# Patient Record
Sex: Female | Born: 1990 | Race: Black or African American | Hispanic: No | Marital: Married | Smoking: Never smoker
Health system: Southern US, Community
[De-identification: ages and names within clinical notes are randomized; demographics above are authoritative.]

## PROBLEM LIST (undated history)

## (undated) ENCOUNTER — Inpatient Hospital Stay (HOSPITAL_COMMUNITY): Payer: Self-pay

## (undated) DIAGNOSIS — E119 Type 2 diabetes mellitus without complications: Secondary | ICD-10-CM

## (undated) DIAGNOSIS — D649 Anemia, unspecified: Secondary | ICD-10-CM

## (undated) DIAGNOSIS — F419 Anxiety disorder, unspecified: Secondary | ICD-10-CM

## (undated) DIAGNOSIS — D219 Benign neoplasm of connective and other soft tissue, unspecified: Secondary | ICD-10-CM

## (undated) DIAGNOSIS — M549 Dorsalgia, unspecified: Secondary | ICD-10-CM

## (undated) DIAGNOSIS — G8929 Other chronic pain: Secondary | ICD-10-CM

## (undated) HISTORY — DX: Anemia, unspecified: D64.9

## (undated) HISTORY — PX: NO PAST SURGERIES: SHX2092

---

## 2010-10-17 NOTE — L&D Delivery Note (Signed)
Delivery Note At 1:27 AM a viable female was delivered via Vaginal, Spontaneous Delivery (Presentation: Right Occiput Anterior).  APGAR: 8, 8; weight 7 lb 8.3 oz (3410 g).   Placenta status: Intact, Spontaneous.  Cord: 3 vessels with the following complications: None.  Anesthesia: Epidural Local  Episiotomy: none Lacerations: 4th degree Suture Repair: 2.0, 3.0, and 0 vicryl Est. Blood Loss (mL): 300cc  The delivery was performed by Almond Lint, CNM.  I was called to evaluate patient secondary to an extensive laceration.  A 4th degree laceration was visualized and repaired in layers by repairing the rectal mucosa with 3-0 vicryl via subcuticular stitch.  The sphincter was repaired with several stitches of 0 vicryl.  The underlying tissue and vaginal mucosa was repaired with 2-0 vicryl and remainder repaired with 3-0 vicryl via subcuticular stitch.  The epidural was suboptimal and 10mg  of Nubain was given along with local of approximately 20cc of 1% lidocaine.  Mom to postpartum.  Baby to nursery-stable.  Deborah Dondero Y 05/16/2011, 3:22 AM

## 2010-12-01 LAB — ABO/RH: RH Type: POSITIVE

## 2010-12-01 LAB — RPR: RPR: NONREACTIVE

## 2010-12-01 LAB — TYPE AND SCREEN: Antibody Screen: NEGATIVE

## 2010-12-01 LAB — HIV ANTIBODY (ROUTINE TESTING W REFLEX): HIV: NONREACTIVE

## 2011-04-21 LAB — STREP B DNA PROBE: GBS: NEGATIVE

## 2011-05-15 ENCOUNTER — Inpatient Hospital Stay (HOSPITAL_COMMUNITY): Payer: Medicaid Other | Admitting: Anesthesiology

## 2011-05-15 ENCOUNTER — Encounter (HOSPITAL_COMMUNITY): Payer: Self-pay | Admitting: Anesthesiology

## 2011-05-15 ENCOUNTER — Inpatient Hospital Stay (HOSPITAL_COMMUNITY)
Admission: AD | Admit: 2011-05-15 | Discharge: 2011-05-18 | DRG: 775 | Disposition: A | Discharge status: Home / Self Care | Payer: Medicaid Other | Source: Ambulatory Visit | Attending: Obstetrics and Gynecology | Admitting: Obstetrics and Gynecology

## 2011-05-15 ENCOUNTER — Encounter (HOSPITAL_COMMUNITY): Payer: Self-pay

## 2011-05-15 DIAGNOSIS — D649 Anemia, unspecified: Secondary | ICD-10-CM | POA: Diagnosis not present

## 2011-05-15 DIAGNOSIS — M549 Dorsalgia, unspecified: Secondary | ICD-10-CM | POA: Insufficient documentation

## 2011-05-15 DIAGNOSIS — Z603 Acculturation difficulty: Secondary | ICD-10-CM

## 2011-05-15 DIAGNOSIS — G8929 Other chronic pain: Secondary | ICD-10-CM | POA: Insufficient documentation

## 2011-05-15 DIAGNOSIS — O9903 Anemia complicating the puerperium: Secondary | ICD-10-CM | POA: Diagnosis not present

## 2011-05-15 HISTORY — DX: Other chronic pain: G89.29

## 2011-05-15 HISTORY — DX: Benign neoplasm of connective and other soft tissue, unspecified: D21.9

## 2011-05-15 HISTORY — DX: Dorsalgia, unspecified: M54.9

## 2011-05-15 LAB — CBC
HCT: 32.6 % — ABNORMAL LOW (ref 36.0–46.0)
MCHC: 33.7 g/dL (ref 30.0–36.0)
MCV: 83 fL (ref 78.0–100.0)
RDW: 13.3 % (ref 11.5–15.5)

## 2011-05-15 MED ORDER — LIDOCAINE HCL (PF) 1 % IJ SOLN: 30.0000 mL | INTRAMUSCULAR | Status: DC | PRN

## 2011-05-15 MED ORDER — FENTANYL CITRATE 0.05 MG/ML IJ SOLN
100.0000 ug | INTRAMUSCULAR | Status: DC | PRN
  Administered 2011-05-15: 100 ug via INTRAVENOUS
  Filled 2011-05-15: qty 2

## 2011-05-15 MED ORDER — ACETAMINOPHEN 325 MG PO TABS: 650.0000 mg | ORAL_TABLET | ORAL | Status: DC | PRN

## 2011-05-15 MED ORDER — OXYCODONE-ACETAMINOPHEN 5-325 MG PO TABS
2.0000 | ORAL_TABLET | ORAL | Status: DC | PRN
  Filled 2011-05-15: qty 1

## 2011-05-15 MED ORDER — OXYCODONE-ACETAMINOPHEN 5-325 MG PO TABS: 2.0000 | ORAL_TABLET | ORAL | Status: DC | PRN

## 2011-05-15 MED ORDER — DIPHENHYDRAMINE HCL 50 MG/ML IJ SOLN: 12.5000 mg | INTRAMUSCULAR | Status: DC | PRN

## 2011-05-15 MED ORDER — LACTATED RINGERS IV SOLN
INTRAVENOUS | Status: DC
  Administered 2011-05-15 – 2011-05-16 (×2): via INTRAVENOUS

## 2011-05-15 MED ORDER — CITRIC ACID-SODIUM CITRATE 334-500 MG/5ML PO SOLN: 30.0000 mL | ORAL | Status: DC | PRN

## 2011-05-15 MED ORDER — OXYTOCIN 10 UNIT/ML IJ SOLN: 10.0000 [IU] | Freq: Once | INTRAMUSCULAR | Status: DC

## 2011-05-15 MED ORDER — PHENYLEPHRINE 40 MCG/ML (10ML) SYRINGE FOR IV PUSH (FOR BLOOD PRESSURE SUPPORT)
80.0000 ug | PREFILLED_SYRINGE | INTRAVENOUS | Status: DC | PRN
  Filled 2011-05-15 (×2): qty 5

## 2011-05-15 MED ORDER — OXYTOCIN 20 UNITS IN LACTATED RINGERS INFUSION - SIMPLE
125.0000 mL/h | Freq: Once | INTRAVENOUS | Status: AC
  Administered 2011-05-16: 999 mL/h via INTRAVENOUS
  Filled 2011-05-15: qty 1000

## 2011-05-15 MED ORDER — LACTATED RINGERS IV SOLN: 500.0000 mL | INTRAVENOUS | Status: DC | PRN

## 2011-05-15 MED ORDER — ONDANSETRON HCL 4 MG/2ML IJ SOLN: 4.0000 mg | Freq: Four times a day (QID) | INTRAMUSCULAR | Status: DC | PRN

## 2011-05-15 MED ORDER — LACTATED RINGERS IV SOLN: INTRAVENOUS | Status: DC

## 2011-05-15 MED ORDER — LIDOCAINE HCL (PF) 1 % IJ SOLN
30.0000 mL | INTRAMUSCULAR | Status: AC | PRN
  Administered 2011-05-16: 30 mL via SUBCUTANEOUS
  Filled 2011-05-15 (×2): qty 30

## 2011-05-15 MED ORDER — FENTANYL 2.5 MCG/ML BUPIVACAINE 1/10 % EPIDURAL INFUSION (WH - ANES)
14.0000 mL/h | INTRAMUSCULAR | Status: DC
  Administered 2011-05-16 (×2): 14 mL/h via EPIDURAL
  Filled 2011-05-15: qty 60

## 2011-05-15 MED ORDER — PHENYLEPHRINE 40 MCG/ML (10ML) SYRINGE FOR IV PUSH (FOR BLOOD PRESSURE SUPPORT)
80.0000 ug | PREFILLED_SYRINGE | INTRAVENOUS | Status: DC | PRN
  Filled 2011-05-15: qty 5

## 2011-05-15 MED ORDER — IBUPROFEN 600 MG PO TABS: 600.0000 mg | ORAL_TABLET | Freq: Four times a day (QID) | ORAL | Status: DC | PRN

## 2011-05-15 MED ORDER — OXYTOCIN 20 UNITS IN LACTATED RINGERS INFUSION - SIMPLE: 125.0000 mL/h | Freq: Once | INTRAVENOUS | Status: DC

## 2011-05-15 MED ORDER — IBUPROFEN 600 MG PO TABS
600.0000 mg | ORAL_TABLET | Freq: Four times a day (QID) | ORAL | Status: DC | PRN
  Administered 2011-05-16: 600 mg via ORAL
  Filled 2011-05-15 (×10): qty 1

## 2011-05-15 MED ORDER — EPHEDRINE 5 MG/ML INJ
10.0000 mg | INTRAVENOUS | Status: DC | PRN
  Filled 2011-05-15 (×2): qty 4

## 2011-05-15 MED ORDER — EPHEDRINE 5 MG/ML INJ
10.0000 mg | INTRAVENOUS | Status: DC | PRN
  Filled 2011-05-15: qty 4

## 2011-05-15 NOTE — Progress Notes (Signed)
.  Subjective: Pt resting in bed, breathing w ctx, denies need for pain medication   Objective: BP 137/85  Pulse 90  Temp(Src) 98 F (36.7 C) (Oral)  Resp 20  LMP 08/09/2010   FHT:  FHR: 145 bpm, variability: moderate,  accelerations:  Present,  decelerations:  Absent UC:   irregular, every 3-5 minutes SVE:   Dilation: 7 Effacement (%): 100 Station: -1 Exam by:: Winifred Olive, RNC-OB    Assessment / Plan: Spontaneous labor, progressing normally  Fetal Wellbeing:  Category I Pain Control:  Labor support without medications  Continue expectant management  Dr Su Hilt aware of admission   Jasmin Ellis 05/15/2011, 2:17 PM

## 2011-05-15 NOTE — H&P (Signed)
Jasmin Ellis is a 20 y.o. female presenting for ctx, denies VB, LOF, repots +FM. Denies PIH sx's, no other c/o.  Maternal Medical History:  Reason for admission: Reason for admission: contractions.  Contractions: Onset was 3-5 hours ago.   Frequency: regular.   Duration is approximately 60 seconds.   Perceived severity is moderate.    Fetal activity: Perceived fetal activity is normal.   Last perceived fetal movement was within the past hour.      OB History    Grav Para Term Preterm Abortions TAB SAB Ect Mult Living   1 0 0 0 0 0 0 0 0 0      Past Medical History  Diagnosis Date  . Back pain, chronic     s/p fall 8 years ago  . Fibroids    Past Surgical History  Procedure Date  . No past surgeries    Family History: family history is not on file. Social History:  reports that she has never smoked. She has never used smokeless tobacco. She reports that she does not drink alcohol or use illicit drugs.  Review of Systems  Musculoskeletal: Positive for back pain.  Skin: Positive for rash.  All other systems reviewed and are negative.  rash has occurred off and on during pregnancy.   Dilation: 4 Effacement (%): 80 Station: -2 Exam by:: [employer], RNC-OB Blood pressure 130/79, pulse 105, temperature 98.1 F (36.7 C), temperature source Oral, resp. rate 18, last menstrual period 08/09/2010. Maternal Exam:  Uterine Assessment: Contraction strength is moderate.  Contraction frequency is regular.   Abdomen: Patient reports no abdominal tenderness. Estimated fetal weight is 7#10oz.   Fetal presentation: vertex  Introitus: Normal vulva. Normal vagina.    Fetal Exam Fetal Monitor Review: Mode: ultrasound.   Baseline rate: 150.  Variability: moderate (6-25 bpm).   Pattern: accelerations present and no decelerations.    Fetal State Assessment: Category I - tracings are normal.     Physical Exam  Vitals reviewed. Constitutional: She is oriented to person, place,  and time. She appears well-developed and well-nourished.  Cardiovascular: Normal rate and regular rhythm.   Respiratory: Effort normal and breath sounds normal.  GI: Soft.  Neurological: She is alert and oriented to person, place, and time. She has normal reflexes.  Skin: Skin is warm and dry.  Psychiatric: She has a normal mood and affect. Her behavior is normal.    Prenatal labs: ABO, Rh:  O Pos Antibody: Negative (02/15 0000) Rubella:  Immune RPR: Nonreactive (02/15 0000)  HBsAg: Negative (02/15 0000)  HIV: Non-reactive (02/15 0000)  GBS:   neg 1hr gtt - wnl QUAD - wnl    Assessment/Plan: Term IUP  GBS neg Early/active labor FHR reassuring  Admit to B.S. CNM service Routine CNM orders Analgesia PRN Consider AROM   Hammond Obeirne M 05/15/2011, 10:56 AM

## 2011-05-15 NOTE — Progress Notes (Signed)
.  Subjective: Pt crying and restless Requests epidural Feels no urge to push   Objective: BP 136/78  Pulse 101  Temp(Src) 98 F (36.7 C) (Oral)  Resp 20  Ht 5\' 4"  (1.626 m)  Wt 71.668 kg (158 lb)  BMI 27.12 kg/m2  LMP 08/09/2010   FHT:  FHR: 140 bpm, variability: moderate,  accelerations:  Present,  decelerations:  Absent UC:   regular, every 3 minutes SVE:   Dilation: 10 Effacement (%): 100 Station: +2 Exam by:: S Johany Hansman. CNM  Attempt to push, no fetal descent, baby is direct OP     Assessment / Plan: Spontaneous labor, progressing normally  Fetal Wellbeing:  Category I Pain Control:  Fentanyl  FHR reassuring  Will get epidural and labor down Dr Su Hilt updated   Malissa Hippo 05/15/2011, 11:27 PM

## 2011-05-15 NOTE — Progress Notes (Signed)
Report given to H. Clovis Riley, Charity fundraiser.

## 2011-05-15 NOTE — Progress Notes (Signed)
CNM Lillard updated re pt presence, status, FHT, UCs, VS.

## 2011-05-15 NOTE — Anesthesia Procedure Notes (Addendum)
Epidural Patient location during procedure: OB Start time: 05/15/2011 11:45 PM Reason for block: procedure for pain  Staffing Anesthesiologist: Kayln Garceau L. Performed by: anesthesiologist   Preanesthetic Checklist Completed: patient identified, site marked, surgical consent, pre-op evaluation, timeout performed, IV checked, risks and benefits discussed and monitors and equipment checked  Epidural Patient position: sitting Prep: site prepped and draped and DuraPrep Patient monitoring: continuous pulse ox, blood pressure and heart rate Approach: midline Injection technique: LOR air  Needle:  Needle type: Tuohy  Needle gauge: 17 G Needle length: 9 cm Needle insertion depth: 6 cm Catheter type: closed end flexible Catheter size: 19 Gauge Catheter at skin depth: 11 cm Test dose: negative  Assessment Events: blood not aspirated, injection not painful, no injection resistance, negative IV test and no paresthesia  Additional Notes Discussed risk of headache, infection, bleeding, nerve injury and failed or incomplete block using [place] interpreter by phone.  Patient voices understanding and wishes to proceed.  Placement difficult due to patient position and movement.  No apparent complications.

## 2011-05-15 NOTE — Anesthesia Preprocedure Evaluation (Addendum)
Anesthesia Evaluation  Name, MR# and DOB Patient awake  General Assessment Comment  Reviewed: Allergy & Precautions, H&P , Patient's Chart, lab work & pertinent test results and reviewed documented beta blocker date and time   Airway Mallampati: II      Dental   Pulmonaryneg pulmonary ROS    clear to auscultation    Cardiovascular regular Normal+ Systolic murmurs (SEM)   Neuro/PsychNegative Neurological ROS Negative Psych ROS  GI/Hepatic/Renal negative GI ROS, negative Liver ROS, and negative Renal ROS (+)       Endo/Other  Negative Endocrine ROS (+)   Abdominal   Musculoskeletal  Hematology negative hematology ROS (+)   Peds  Reproductive/Obstetrics (+) Pregnancy   Anesthesia Other Findings Chronic low back pain from a fall 8 years ago.  No h/o back surgery.            Anesthesia Physical Anesthesia Plan  ASA: II  Anesthesia Plan: Epidural   Post-op Pain Management:    Induction:   Airway Management Planned:   Additional Equipment:   Intra-op Plan:   Post-operative Plan:   Informed Consent: I have reviewed the patients History and Physical, chart, labs and discussed the procedure including the risks, benefits and alternatives for the proposed anesthesia with the patient or authorized representative who has indicated his/her understanding and acceptance.     Plan Discussed with:   Anesthesia Plan Comments:         Anesthesia Quick Evaluation

## 2011-05-15 NOTE — Progress Notes (Signed)
.  Subjective: Pt c/o increased pain, denies urge to push, declines medication    Objective: BP 125/76  Pulse 82  Temp(Src) 98.4 F (36.9 C) (Oral)  Resp 18  Ht 5\' 4"  (1.626 m)  Wt 71.668 kg (158 lb)  BMI 27.12 kg/m2  LMP 08/09/2010   FHT:  FHR: 140 bpm, variability: moderate,  accelerations:  Present,  decelerations:  Absent UC:   irregular, every 3-5 minutes SVE:   Dilation: 8 Effacement (%): 100 Station: 0 Exam by:: S Renalda Locklin CNM    Assessment / Plan: Spontaneous labor, progressing normally  Fetal Wellbeing:  Category I Pain Control:  Labor support without medications  Continue expectant management   Samaia Iwata M 05/15/2011, 6:03 PM

## 2011-05-15 NOTE — Progress Notes (Signed)
.  Subjective: Pt crying with ctx, requests IV pain med, no urge to push   Objective: BP 137/69  Pulse 96  Temp(Src) 98 F (36.7 C) (Oral)  Resp 20  Ht 5\' 4"  (1.626 m)  Wt 71.668 kg (158 lb)  BMI 27.12 kg/m2  LMP 08/09/2010   FHT:  FHR: 130 bpm, variability: moderate,  accelerations:  Present,  decelerations:  Absent UC:   regular, every 2-3 minutes SVE:   Dilation: 9 Effacement (%): 100 Station: 0 Exam by:: S. Blase Beckner CNM (meconium stained )    Assessment / Plan: Spontaneous labor, progressing normally  Fetal Wellbeing:  Category I Pain Control:  Fentanyl  Continue expectant management    Lan Entsminger M 05/15/2011, 9:50 PM

## 2011-05-15 NOTE — Progress Notes (Signed)
Asked Belenda Cruise, RNC & Charna Archer, RN to monitor pt while I assist c an epidural in another Rm.

## 2011-05-16 ENCOUNTER — Encounter (HOSPITAL_COMMUNITY): Payer: Self-pay | Admitting: *Deleted

## 2011-05-16 MED ORDER — SIMETHICONE 80 MG PO CHEW: 80.0000 mg | CHEWABLE_TABLET | ORAL | Status: DC | PRN

## 2011-05-16 MED ORDER — BENZOCAINE-MENTHOL 20-0.5 % EX AERO: 1.0000 "application " | INHALATION_SPRAY | CUTANEOUS | Status: DC | PRN

## 2011-05-16 MED ORDER — ZOLPIDEM TARTRATE 5 MG PO TABS: 5.0000 mg | ORAL_TABLET | Freq: Every evening | ORAL | Status: DC | PRN

## 2011-05-16 MED ORDER — SODIUM BICARBONATE 8.4 % IV SOLN
INTRAVENOUS | Status: DC | PRN
  Administered 2011-05-16: 5 mL via EPIDURAL

## 2011-05-16 MED ORDER — IBUPROFEN 600 MG PO TABS
600.0000 mg | ORAL_TABLET | Freq: Four times a day (QID) | ORAL | Status: DC
  Administered 2011-05-16 – 2011-05-18 (×7): 600 mg via ORAL

## 2011-05-16 MED ORDER — NALBUPHINE SYRINGE 5 MG/0.5 ML
INJECTION | INTRAMUSCULAR | Status: AC
  Administered 2011-05-16: 10 mg via INTRAVENOUS
  Filled 2011-05-16: qty 1

## 2011-05-16 MED ORDER — TETANUS-DIPHTH-ACELL PERTUSSIS 5-2.5-18.5 LF-MCG/0.5 IM SUSP
0.5000 mL | Freq: Once | INTRAMUSCULAR | Status: AC
  Administered 2011-05-17: 0.5 mL via INTRAMUSCULAR

## 2011-05-16 MED ORDER — BENZOCAINE-MENTHOL 20-0.5 % EX AERO
INHALATION_SPRAY | CUTANEOUS | Status: AC
  Filled 2011-05-16: qty 56

## 2011-05-16 MED ORDER — LANOLIN HYDROUS EX OINT: TOPICAL_OINTMENT | CUTANEOUS | Status: DC | PRN

## 2011-05-16 MED ORDER — WITCH HAZEL-GLYCERIN EX PADS: MEDICATED_PAD | CUTANEOUS | Status: DC | PRN

## 2011-05-16 MED ORDER — NALBUPHINE SYRINGE 5 MG/0.5 ML
10.0000 mg | INJECTION | Freq: Four times a day (QID) | INTRAMUSCULAR | Status: AC | PRN
  Administered 2011-05-16: 10 mg via INTRAVENOUS

## 2011-05-16 MED ORDER — DIPHENHYDRAMINE HCL 25 MG PO CAPS: 25.0000 mg | ORAL_CAPSULE | Freq: Four times a day (QID) | ORAL | Status: DC | PRN

## 2011-05-16 MED ORDER — SENNOSIDES-DOCUSATE SODIUM 8.6-50 MG PO TABS
1.0000 | ORAL_TABLET | Freq: Every day | ORAL | Status: DC
  Administered 2011-05-16: 1 via ORAL
  Administered 2011-05-17: 2 via ORAL

## 2011-05-16 MED ORDER — ONDANSETRON HCL 4 MG/2ML IJ SOLN: 4.0000 mg | INTRAMUSCULAR | Status: DC | PRN

## 2011-05-16 MED ORDER — PRENATAL PLUS 27-1 MG PO TABS
1.0000 | ORAL_TABLET | Freq: Every day | ORAL | Status: DC
  Administered 2011-05-17 – 2011-05-18 (×2): 1 via ORAL
  Filled 2011-05-16 (×2): qty 1

## 2011-05-16 MED ORDER — LIDOCAINE HCL 1.5 % IJ SOLN
INTRAMUSCULAR | Status: DC | PRN
  Administered 2011-05-16: 2 mL
  Administered 2011-05-16 (×2): 5 mL

## 2011-05-16 MED ORDER — ONDANSETRON HCL 4 MG PO TABS: 4.0000 mg | ORAL_TABLET | ORAL | Status: DC | PRN

## 2011-05-16 MED ORDER — OXYCODONE-ACETAMINOPHEN 5-325 MG PO TABS
1.0000 | ORAL_TABLET | ORAL | Status: DC | PRN
  Administered 2011-05-16 – 2011-05-17 (×2): 1 via ORAL
  Filled 2011-05-16: qty 1

## 2011-05-16 NOTE — Progress Notes (Signed)
UR chart review completed.  

## 2011-05-16 NOTE — Progress Notes (Signed)
Basic teaching.

## 2011-05-17 LAB — CBC
MCHC: 33.6 g/dL (ref 30.0–36.0)
Platelets: 202 10*3/uL (ref 150–400)
RDW: 13.9 % (ref 11.5–15.5)
WBC: 16.2 10*3/uL — ABNORMAL HIGH (ref 4.0–10.5)

## 2011-05-17 NOTE — Progress Notes (Signed)
PATIENT HAS BEEN GIVING BOTTLES.  BREASTS VERY FULL/WARM.  ASSISTED WITH FEEDING ON LEFT BREAST IN CROSS CRADLE HOLD.  BABY TAKES SEVERAL SUCKS AND FALLS OFF AND NEEDS RE-LATCHED.  EXPLAINED TO PARENTS THAT BABY NEEDS TO BEGIN NURSING FREQUENTLY TO PREVENT ENGORGEMENT.  ADVISED NO MORE BOTTLES.  ENCOURAGED TO CALL FOR ASSIST PRN.

## 2011-05-17 NOTE — Progress Notes (Signed)
Post Partum Day 1 Subjective: up ad lib, voiding, + flatus and no appetite & has not been eating her s.o. states.  Working on [employer].  NBF "Waliya" in [place] asleep.  No PIH s/s.  VB stable.  Objective: Blood pressure 108/67, pulse 99, temperature 97.5 F (36.4 C), temperature source Oral, resp. rate 18, height 5\' 4"  (1.626 m), weight 71.668 kg (158 lb), last menstrual period 08/09/2010, SpO2 99.00%, unknown if currently breastfeeding.  Physical Exam:  General: alert, cooperative and no distress Lochia: appropriate Uterine Fundus: firm; u/-2 Incision: n/a Perineum: healing DVT Evaluation: No evidence of DVT seen on physical exam. Negative Homan's sign. Calf/Ankle edema is present--mainly mild pedal edema.   Basename 05/15/11 1145  HGB 11.0*  HCT 32.6*    Assessment/Plan: Plan for discharge tomorrow, Breastfeeding and Lactation consult.  Language barrier. Elevated BP's yesterday.  No PIH s/s, but will draw tomorrow AM.  No CBC drawn today--ordered. Encouraged patient to eat well-balanced diet, with protein, & adequate H2O.  Sitz bath prn.  LOS: 2 days   Yesica Kemler H 05/17/2011, 9:47 AM

## 2011-05-17 NOTE — Anesthesia Postprocedure Evaluation (Signed)
  Anesthesia Post-op Note  Patient: Jasmin Ellis  Procedure(s) Performed: * No procedures listed *  Patient Location: 130  Anesthesia Type: Epidural  Level of Consciousness: awake, alert  and oriented  Airway and Oxygen Therapy: Patient Spontanous Breathing  Post-op Pain: mild  Post-op Assessment: Post-op Vital signs reviewed  Post-op Vital Signs: Reviewed and stable  Complications: No apparent anesthesia complications

## 2011-05-18 LAB — COMPREHENSIVE METABOLIC PANEL
ALT: 11 U/L (ref 0–35)
AST: 22 U/L (ref 0–37)
Alkaline Phosphatase: 99 U/L (ref 39–117)
CO2: 23 mEq/L (ref 19–32)
Calcium: 9.8 mg/dL (ref 8.4–10.5)
GFR calc non Af Amer: >60 mL/min (ref >60)
Potassium: 4.3 mEq/L (ref 3.5–5.1)
Sodium: 134 mEq/L — ABNORMAL LOW (ref 135–145)

## 2011-05-18 LAB — CBC
MCH: 28.2 pg (ref 26.0–34.0)
Platelets: 217 10*3/uL (ref 150–400)
RBC: 3.12 MIL/uL — ABNORMAL LOW (ref 3.87–5.11)
WBC: 16.4 10*3/uL — ABNORMAL HIGH (ref 4.0–10.5)

## 2011-05-18 MED ORDER — OXYCODONE-ACETAMINOPHEN 5-325 MG PO TABS: 1.0000 | ORAL_TABLET | ORAL | Status: AC | PRN

## 2011-05-18 MED ORDER — FERROUS GLUCONATE IRON 246 (28 FE) MG PO TABS: 246.0000 mg | ORAL_TABLET | Freq: Every day | ORAL | Status: DC

## 2011-05-18 MED ORDER — IBUPROFEN 600 MG PO TABS: 600.0000 mg | ORAL_TABLET | Freq: Four times a day (QID) | ORAL | Status: AC

## 2011-05-18 MED ORDER — PRENATAL PLUS 27-1 MG PO TABS: 1.0000 | ORAL_TABLET | Freq: Every day | ORAL | Status: DC

## 2011-05-18 NOTE — Progress Notes (Signed)
Assisted with deeper latch, pinching of nipple . Informed mom importance of deep latch to transfer mom milk. Father of baby interpreted.hand pump given with inst .

## 2011-05-18 NOTE — Discharge Summary (Signed)
Obstetric Discharge Summary Reason for Admission: onset of labor Prenatal Procedures: ultrasound Intrapartum Procedures: spontaneous vaginal delivery Postpartum Procedures: none Complications-Operative and Postpartum: 4th degree perineal laceration Hemoglobin  Date Value Range Status  05/18/2011 8.8* 12.0-15.0 (g/dL) Final     HCT  Date Value Range Status  05/18/2011 26.4* 36.0-46.0 (%) Final    Discharge Diagnoses: Term Pregnancy-delivered Anemia without hemodynamic instability 4th degree laceration--healing well  Discharge Information: Date: 05/18/2011 Activity: pelvic rest Diet: routine Medications: Ibuprophen, Percocet and Iron Condition: stable Instructions: refer to practice specific booklet Discharge to: home Follow-up Information    Follow up with Cathye Kreiter L. Make an appointment in 6 weeks.   Contact information:   [address], [address] [place] [address] [phone]          Newborn Data: Live born female  Birth Weight: 7 lb 8.3 oz (3410 g) APGAR: 8, 8  Home with mother.  Kamonte Mcmichen L 05/18/2011, 10:50 AM

## 2011-05-18 NOTE — Progress Notes (Signed)
Post Partum Day 2. Subjective: no complaints.  Ready for discharge.  Husband at bedside.  Undecided regarding contraception.  Breastfeeding going better.  Up ad lib without syncope or dizziness.  Objective: Blood pressure 116/72, pulse 98, temperature 98.3 F (36.8 C), temperature source Oral, resp. rate 18, height 5\' 4"  (1.626 m), weight 71.668 kg (158 lb), last menstrual period 08/09/2010, SpO2 100.00%, unknown if currently breastfeeding.  Physical Exam:  General: alert Lochia: appropriate Uterine Fundus: firm Incision: healing well DVT Evaluation: No evidence of DVT seen on physical exam.   Basename 05/18/11 0500 05/17/11 1040  HGB 8.8* 8.6*  HCT 26.4* 25.6*    Assessment/Plan: Discharge home Rx Ibuprophen 600 mg po q 6 hrs. Prn pain. Rx FeSO4 BID. Follow-up in 6 weeks at [facility] or prn.   LOS: 3 days   Marsha Gundlach L 05/18/2011, 10:47 AM

## 2012-12-10 ENCOUNTER — Emergency Department (HOSPITAL_COMMUNITY)
Admission: EM | Admit: 2012-12-10 | Discharge: 2012-12-10 | Disposition: A | Discharge status: Home / Self Care | Payer: Self-pay | Attending: Emergency Medicine | Admitting: Emergency Medicine

## 2012-12-10 ENCOUNTER — Encounter (HOSPITAL_COMMUNITY): Payer: Self-pay | Admitting: *Deleted

## 2012-12-10 DIAGNOSIS — Z3202 Encounter for pregnancy test, result negative: Secondary | ICD-10-CM | POA: Insufficient documentation

## 2012-12-10 DIAGNOSIS — R109 Unspecified abdominal pain: Secondary | ICD-10-CM | POA: Insufficient documentation

## 2012-12-10 DIAGNOSIS — G8929 Other chronic pain: Secondary | ICD-10-CM | POA: Insufficient documentation

## 2012-12-10 DIAGNOSIS — N760 Acute vaginitis: Secondary | ICD-10-CM | POA: Insufficient documentation

## 2012-12-10 DIAGNOSIS — N898 Other specified noninflammatory disorders of vagina: Secondary | ICD-10-CM | POA: Insufficient documentation

## 2012-12-10 DIAGNOSIS — D259 Leiomyoma of uterus, unspecified: Secondary | ICD-10-CM | POA: Insufficient documentation

## 2012-12-10 DIAGNOSIS — B9689 Other specified bacterial agents as the cause of diseases classified elsewhere: Secondary | ICD-10-CM

## 2012-12-10 LAB — URINALYSIS, ROUTINE W REFLEX MICROSCOPIC
Glucose, UA: NEGATIVE mg/dL
Ketones, ur: NEGATIVE mg/dL
Leukocytes, UA: NEGATIVE
pH: 7 (ref 5.0–8.0)

## 2012-12-10 LAB — URINE MICROSCOPIC-ADD ON

## 2012-12-10 LAB — POCT PREGNANCY, URINE: Preg Test, Ur: NEGATIVE

## 2012-12-10 MED ORDER — METRONIDAZOLE 500 MG PO TABS: 500.0000 mg | ORAL_TABLET | Freq: Two times a day (BID) | ORAL | Status: DC

## 2012-12-10 NOTE — ED Provider Notes (Signed)
History     CSN: [id number]  Arrival date & time 12/10/12  1531   First MD Initiated Contact with Patient 12/10/12 1838      Chief Complaint  Patient presents with  . Abdominal Pain  . Vaginal Discharge    (Consider location/radiation/quality/duration/timing/severity/associated sxs/prior treatment) HPI Comments: Patient is a 22 year old female with a past medical history of uterine fibroid who presents with lower abdominal pain for the past 5 months. The pain is located in her lower abdomen and does not radiate. The pain is described as cramping and moderate. The pain is intermittent and made worse when she is menstruating. The pain started gradually and progressively worsened since the onset. No alleviating factors. The patient has tried nothing for symptoms without relief. Associated symptoms include vaginal discharge. Patient denies fever, headache, NVD, chest pain, SOB, dysuria, constipation, abnormal vaginal bleeding. Patient currently has her menstrual cycle.     Patient is a 22 y.o. female presenting with abdominal pain and vaginal discharge.  Abdominal Pain Associated symptoms: vaginal discharge   Vaginal Discharge Associated symptoms include abdominal pain.    Past Medical History  Diagnosis Date  . Back pain, chronic     s/p fall 8 years ago  . Fibroids     History reviewed. No pertinent past surgical history.  History reviewed. No pertinent family history.  History  Substance Use Topics  . Smoking status: Never Smoker   . Smokeless tobacco: Never Used  . Alcohol Use: No    OB History   Grav Para Term Preterm Abortions TAB SAB Ect Mult Living   1 1 1  0 0 0 0 0 0 1      Review of Systems  Gastrointestinal: Positive for abdominal pain.  Genitourinary: Positive for vaginal discharge.  All other systems reviewed and are negative.    Allergies  Review of patient's allergies indicates no known allergies.  Home Medications   Current Outpatient Rx   Name  Route  Sig  Dispense  Refill  . acetaminophen (TYLENOL) 325 MG tablet   Oral   Take 650 mg by mouth every 6 (six) hours as needed for pain or fever.           BP 131/69  Pulse 79  Temp(Src) 98.2 F (36.8 C) (Oral)  Resp 18  SpO2 99%  Physical Exam  Nursing note and vitals reviewed. Constitutional: She is oriented to person, place, and time. She appears well-developed and well-nourished. No distress.  HENT:  Head: Normocephalic and atraumatic.  Eyes: Conjunctivae and EOM are normal. Pupils are equal, round, and reactive to light. No scleral icterus.  Neck: Normal range of motion.  Cardiovascular: Normal rate and regular rhythm.  Exam reveals no gallop and no friction rub.   No murmur heard. Pulmonary/Chest: Effort normal and breath sounds normal. She has no wheezes. She has no rales. She exhibits no tenderness.  Abdominal: Soft. There is no tenderness.  Genitourinary:  Blood in vagina. Cervical os closed. No CMT. Uterus palpable and slightly enlarged. No abnormal masses or adnexal tenderness.   Musculoskeletal: Normal range of motion.  Neurological: She is alert and oriented to person, place, and time. Coordination normal.  Speech is goal-oriented. Moves limbs without ataxia.   Skin: Skin is warm and dry.  Psychiatric: She has a normal mood and affect. Her behavior is normal.    ED Course  Procedures (including critical care time)  Labs Reviewed  WET PREP, GENITAL - Abnormal; Notable for the following:  Clue Cells Wet Prep HPF POC FEW (*)    WBC, Wet Prep HPF POC FEW (*)    All other components within normal limits  URINALYSIS, ROUTINE W REFLEX MICROSCOPIC - Abnormal; Notable for the following:    APPearance CLOUDY (*)    Specific Gravity, Urine 1.031 (*)    Hgb urine dipstick MODERATE (*)    All other components within normal limits  URINE MICROSCOPIC-ADD ON - Abnormal; Notable for the following:    Bacteria, UA FEW (*)    All other components within  normal limits  GC/CHLAMYDIA PROBE AMP  POCT PREGNANCY, URINE   No results found.   1. BV (bacterial vaginosis)   2. Uterine fibroid       MDM  8:14 PM Urinalysis unremarkable. Wet prep shows clue cells. I will treat the patient for BV. Patient's pain is likely coming from her fibroid. Patient should have follow up with OBGYN. I will refer her to [facility] for further evaluation and management. Patient is afebrile and vitals stable for discharge.         Emilia Beck, [place] 12/10/12 2035

## 2012-12-10 NOTE — ED Notes (Signed)
Pt states had child in 2012 and then they found a fibroid.  Now pt feels something moving in her stomach and reports new white vaginal discharge and reports on menstral no.  Bodyaches

## 2012-12-10 NOTE — ED Notes (Signed)
Patient speaks french. Interpreter line used

## 2012-12-11 LAB — GC/CHLAMYDIA PROBE AMP
CT Probe RNA: NEGATIVE
GC Probe RNA: NEGATIVE

## 2012-12-13 NOTE — ED Provider Notes (Signed)
Medical screening examination/treatment/procedure(s) were performed by non-physician practitioner and as supervising physician I was immediately available for consultation/collaboration.  Gerasimos Plotts T Akia Desroches, MD 12/13/12 0404 

## 2012-12-14 ENCOUNTER — Encounter: Payer: Self-pay | Admitting: Certified Nurse Midwife

## 2013-01-15 ENCOUNTER — Encounter (HOSPITAL_COMMUNITY): Payer: Self-pay

## 2013-01-15 ENCOUNTER — Inpatient Hospital Stay (HOSPITAL_COMMUNITY)
Admission: AD | Admit: 2013-01-15 | Discharge: 2013-01-16 | Disposition: A | Discharge status: Home / Self Care | Payer: Self-pay | Source: Ambulatory Visit | Attending: Obstetrics & Gynecology | Admitting: Obstetrics & Gynecology

## 2013-01-15 DIAGNOSIS — D259 Leiomyoma of uterus, unspecified: Secondary | ICD-10-CM | POA: Insufficient documentation

## 2013-01-15 DIAGNOSIS — R109 Unspecified abdominal pain: Secondary | ICD-10-CM | POA: Insufficient documentation

## 2013-01-15 DIAGNOSIS — Z3202 Encounter for pregnancy test, result negative: Secondary | ICD-10-CM | POA: Insufficient documentation

## 2013-01-15 LAB — CBC
Platelets: 289 10*3/uL (ref 150–400)
RBC: 4.04 MIL/uL (ref 3.87–5.11)
WBC: 10.5 10*3/uL (ref 4.0–10.5)

## 2013-01-15 MED ORDER — IBUPROFEN 600 MG PO TABS
600.0000 mg | ORAL_TABLET | Freq: Once | ORAL | Status: AC
  Administered 2013-01-16: 600 mg via ORAL
  Filled 2013-01-15: qty 1

## 2013-01-15 NOTE — MAU Note (Signed)
Pt reports she missed her period last month, was due 03/20, then started bleeding today, had cramping this am , none now

## 2013-01-16 MED ORDER — IBUPROFEN 600 MG PO TABS: 600.0000 mg | ORAL_TABLET | Freq: Four times a day (QID) | ORAL | Status: DC | PRN

## 2013-01-16 NOTE — MAU Provider Note (Signed)
History     CSN: [id number]  Arrival date and time: 01/15/13 2112   First Provider Initiated Contact with Patient 01/15/13 2336      Chief Complaint  Patient presents with  . Possible Pregnancy   HPI Jasmin Ellis 22 y.o. Comes with vaginal bleeding - heavier earlier this week.  Also feels her fibroid moving in her abdomen.  Diagnosed with a fibroid during pregnancy 18 months ago.  Is worried it is getting worse and she has not had it checked.  Is having lower abdominal cramping today and has not taken anything for pain. OB History   Grav Para Term Preterm Abortions TAB SAB Ect Mult Living   1 1 1  0 0 0 0 0 0 1      Past Medical History  Diagnosis Date  . Back pain, chronic     s/p fall 8 years ago  . Fibroids     History reviewed. No pertinent past surgical history.  History reviewed. No pertinent family history.  History  Substance Use Topics  . Smoking status: Never Smoker   . Smokeless tobacco: Never Used  . Alcohol Use: No    Allergies: No Known Allergies  Prescriptions prior to admission  Medication Sig Dispense Refill  . acetaminophen (TYLENOL) 325 MG tablet Take 650 mg by mouth every 6 (six) hours as needed for pain or fever.      . metroNIDAZOLE (FLAGYL) 500 MG tablet Take 1 tablet (500 mg total) by mouth 2 (two) times daily.  14 tablet  0    Review of Systems  Constitutional: Negative for fever.  Gastrointestinal: Positive for abdominal pain. Negative for nausea, vomiting, diarrhea and constipation.  Genitourinary:       No vaginal discharge. Vaginal bleeding. No dysuria.   Physical Exam   Blood pressure 157/88, pulse 92, temperature 98.1 F (36.7 C), temperature source Oral, resp. rate 18, height 5\' 2"  (1.575 m), weight 197 lb (89.359 kg), last menstrual period 01/15/2013, SpO2 100.00%.  Physical Exam  Nursing note and vitals reviewed. Constitutional: She is oriented to person, place, and time. She appears well-developed and well-nourished.   HENT:  Head: Normocephalic.  Eyes: EOM are normal.  Neck: Neck supple.  GI: Soft. Bowel sounds are normal. There is tenderness. There is no rebound and no guarding.  Mild lower midline tenderness  Musculoskeletal: Normal range of motion.  Neurological: She is alert and oriented to person, place, and time.  Skin: Skin is warm and dry.  Psychiatric: She has a normal mood and affect.    MAU Course  Procedures  MDM Results for orders placed during the hospital encounter of 01/15/13 (from the past 24 hour(s))  POCT PREGNANCY, URINE     Status: None   Collection Time    01/15/13 10:22 PM      Result Value Range   Preg Test, Ur NEGATIVE  NEGATIVE  CBC     Status: Abnormal   Collection Time    01/15/13 11:37 PM      Result Value Range   WBC 10.5  4.0 - 10.5 K/uL   RBC 4.04  3.87 - 5.11 MIL/uL   Hemoglobin 11.7 (*) 12.0 - 15.0 g/dL   HCT 40.9 (*) 81.1 - 91.4 %   MCV 83.2  78.0 - 100.0 fL   MCH 29.0  26.0 - 34.0 pg   MCHC 34.8  30.0 - 36.0 g/dL   RDW 78.2  95.6 - 21.3 %   Platelets 289  150 -  400 K/uL     Assessment and Plan  Abdominal pain  History of fibroids seen on OB ultrasound.  Plan Will have ultrasound do OP ultrasound to check fibroids toradol IM today for pain Rx ibuprofen 600 mg  Evrett Hakim 01/16/2013, 12:06 AM

## 2013-01-18 ENCOUNTER — Ambulatory Visit (HOSPITAL_COMMUNITY)
Admission: RE | Admit: 2013-01-18 | Discharge: 2013-01-18 | Disposition: A | Discharge status: Home / Self Care | Payer: Self-pay | Source: Ambulatory Visit | Attending: Nurse Practitioner | Admitting: Nurse Practitioner

## 2013-01-18 ENCOUNTER — Encounter (HOSPITAL_COMMUNITY): Payer: Self-pay

## 2013-01-18 DIAGNOSIS — N949 Unspecified condition associated with female genital organs and menstrual cycle: Secondary | ICD-10-CM | POA: Insufficient documentation

## 2013-01-18 DIAGNOSIS — N926 Irregular menstruation, unspecified: Secondary | ICD-10-CM | POA: Insufficient documentation

## 2013-01-18 DIAGNOSIS — D251 Intramural leiomyoma of uterus: Secondary | ICD-10-CM | POA: Insufficient documentation

## 2013-02-07 ENCOUNTER — Encounter: Payer: Medicaid Other | Admitting: Family Medicine

## 2013-06-28 ENCOUNTER — Encounter (HOSPITAL_COMMUNITY): Payer: Self-pay | Admitting: *Deleted

## 2013-06-28 ENCOUNTER — Inpatient Hospital Stay (HOSPITAL_COMMUNITY)
Admission: AD | Admit: 2013-06-28 | Discharge: 2013-06-28 | Disposition: A | Discharge status: Home / Self Care | Payer: Medicaid Other | Source: Ambulatory Visit | Attending: Obstetrics & Gynecology | Admitting: Obstetrics & Gynecology

## 2013-06-28 DIAGNOSIS — Z3202 Encounter for pregnancy test, result negative: Secondary | ICD-10-CM | POA: Insufficient documentation

## 2013-06-28 DIAGNOSIS — N912 Amenorrhea, unspecified: Secondary | ICD-10-CM | POA: Insufficient documentation

## 2013-06-28 DIAGNOSIS — N949 Unspecified condition associated with female genital organs and menstrual cycle: Secondary | ICD-10-CM | POA: Insufficient documentation

## 2013-06-28 LAB — URINALYSIS, ROUTINE W REFLEX MICROSCOPIC
Bilirubin Urine: NEGATIVE
Glucose, UA: NEGATIVE mg/dL
Ketones, ur: NEGATIVE mg/dL
Leukocytes, UA: NEGATIVE
pH: 7.5 (ref 5.0–8.0)

## 2013-06-28 LAB — WET PREP, GENITAL
Clue Cells Wet Prep HPF POC: NONE SEEN
Trich, Wet Prep: NONE SEEN
Yeast Wet Prep HPF POC: NONE SEEN

## 2013-06-28 LAB — URINE MICROSCOPIC-ADD ON

## 2013-06-28 NOTE — MAU Note (Signed)
Pt also has white vaginal discharge, denies odor or itching.

## 2013-06-28 NOTE — MAU Note (Signed)
Irregular, light periods for the last 3 months, neg HPT last month.  Denies pain.

## 2013-06-28 NOTE — MAU Note (Signed)
Patient presents with c/o white vaginal discharge and concerns regarding not having a period since June. Denies pain at this time.

## 2013-06-28 NOTE — MAU Provider Note (Signed)
History     CSN: [id number]  Arrival date and time: 06/28/13 1003   First Provider Initiated Contact with Patient 06/28/13 1256      Chief Complaint  Patient presents with  . possible pregnant    HPI Jasmin Ellis is 22 y.o. G1P1001 Unknown weeks presenting with :  Her UPT here is NEG #Vaginal D/C -6 days -Yellow and white discharge. -Denies any abdominal or vaginal pain -Has had vaginal infections in the past -Sexually active and in monogamous relationship. Denies condom use.  #Amenorrhea -LMP: June 14th -Previously regular periods, without ever missing them (aside from her 1 pregnancy) -Endorses fatigue. Denies moodiness, changes in diet, medication, skin / hair changes. -H/o fibroids  Past Medical History  Diagnosis Date  . Back pain, chronic     s/p fall 8 years ago  . Fibroids     History reviewed. No pertinent past surgical history.  History reviewed. No pertinent family history.  History  Substance Use Topics  . Smoking status: Never Smoker   . Smokeless tobacco: Never Used  . Alcohol Use: No    Allergies: No Known Allergies  Prescriptions prior to admission  Medication Sig Dispense Refill  . acetaminophen (TYLENOL) 325 MG tablet Take 650 mg by mouth every 6 (six) hours as needed for pain or fever.        Review of Systems  Constitutional: Positive for malaise/fatigue. Negative for fever, chills and diaphoresis.  Gastrointestinal: Negative for nausea, vomiting, abdominal pain, diarrhea and constipation.  Genitourinary: Positive for hematuria. Negative for dysuria and frequency.       Denies vaginal bleeding  Skin:       No hair or skin changes / hirsutism.  Psychiatric/Behavioral: The patient is not nervous/anxious.        Denies moodiness   Physical Exam   Blood pressure 130/81, pulse 69, temperature 97.5 F (36.4 C), temperature source Oral, resp. rate 18, height 5\' 6"  (1.676 m), weight 199 lb (90.266 kg), last menstrual period  03/30/2013.  Physical Exam  Constitutional: She appears well-developed and well-nourished. No distress.  Eyes: Conjunctivae are normal.  Cardiovascular: Normal rate, regular rhythm, normal heart sounds and intact distal pulses.  Exam reveals no gallop and no friction rub.   No murmur heard. Respiratory: Breath sounds normal. No respiratory distress. She has no wheezes. She has no rales.  GI: Soft. There is no tenderness.  Genitourinary: There is no tenderness or lesion on the right labia. There is no tenderness or lesion on the left labia. Cervix exhibits discharge (scant mucous / bloody d/c). Cervix exhibits no motion tenderness and no friability. Right adnexum displays no mass, no tenderness and no fullness. Left adnexum displays no mass, no tenderness and no fullness. No erythema or bleeding around the vagina. No signs of injury around the vagina. Vaginal discharge (scant white d/c) found.  Musculoskeletal: She exhibits no edema.  Skin: Skin is warm and dry. She is not diaphoretic.  Normal hair distribution   Psychiatric: She has a normal mood and affect.    MAU Course  Procedures Results for orders placed during the hospital encounter of 06/28/13 (from the past 24 hour(s))  URINALYSIS, ROUTINE W REFLEX MICROSCOPIC     Status: Abnormal   Collection Time    06/28/13 10:16 AM      Result Value Range   Color, Urine YELLOW  YELLOW   APPearance HAZY (*) CLEAR   Specific Gravity, Urine 1.015  1.005 - 1.030   pH 7.5  5.0 - 8.0   Glucose, UA NEGATIVE  NEGATIVE mg/dL   Hgb urine dipstick TRACE (*) NEGATIVE   Bilirubin Urine NEGATIVE  NEGATIVE   Ketones, ur NEGATIVE  NEGATIVE mg/dL   Protein, ur NEGATIVE  NEGATIVE mg/dL   Urobilinogen, UA 0.2  0.0 - 1.0 mg/dL   Nitrite NEGATIVE  NEGATIVE   Leukocytes, UA NEGATIVE  NEGATIVE  URINE MICROSCOPIC-ADD ON     Status: Abnormal   Collection Time    06/28/13 10:16 AM      Result Value Range   Squamous Epithelial / LPF MANY (*) RARE   WBC, UA  0-2  <3 WBC/hpf   RBC / HPF 0-2  <3 RBC/hpf   Bacteria, UA FEW (*) RARE   Urine-Other MUCOUS PRESENT    POCT PREGNANCY, URINE     Status: None   Collection Time    06/28/13 10:25 AM      Result Value Range   Preg Test, Ur NEGATIVE  NEGATIVE  POCT PREGNANCY, URINE     Status: None   Collection Time    06/28/13 11:23 AM      Result Value Range   Preg Test, Ur NEGATIVE  NEGATIVE  WET PREP, GENITAL     Status: Abnormal   Collection Time    06/28/13  2:01 PM      Result Value Range   Yeast Wet Prep HPF POC NONE SEEN  NONE SEEN   Trich, Wet Prep NONE SEEN  NONE SEEN   Clue Cells Wet Prep HPF POC NONE SEEN  NONE SEEN   WBC, Wet Prep HPF POC FEW (*) NONE SEEN    MDM: Amenorrhea - unknown etiology. Negative pregnancy test.  Vaginal d/c - Wet Prep pending. GC/Chlamydia pending.  Assessment and Plan  #Amenorrhea -Refer to [facility] clinic for further eval  #Vaginal D/C--WET PREP NEG Jeani Sow M 06/28/2013, 2:13 PM   I have read the student's HPI, ROS and observed his physical exam and agree with this findings and plan of care.

## 2013-08-31 ENCOUNTER — Emergency Department (HOSPITAL_COMMUNITY)
Admission: EM | Admit: 2013-08-31 | Discharge: 2013-08-31 | Disposition: A | Discharge status: Home / Self Care | Payer: Medicaid Other | Attending: Emergency Medicine | Admitting: Emergency Medicine

## 2013-08-31 ENCOUNTER — Encounter (HOSPITAL_COMMUNITY): Payer: Self-pay | Admitting: Emergency Medicine

## 2013-08-31 DIAGNOSIS — Z8742 Personal history of other diseases of the female genital tract: Secondary | ICD-10-CM | POA: Insufficient documentation

## 2013-08-31 DIAGNOSIS — N63 Unspecified lump in unspecified breast: Secondary | ICD-10-CM

## 2013-08-31 DIAGNOSIS — G8929 Other chronic pain: Secondary | ICD-10-CM | POA: Insufficient documentation

## 2013-08-31 DIAGNOSIS — Z3202 Encounter for pregnancy test, result negative: Secondary | ICD-10-CM | POA: Insufficient documentation

## 2013-08-31 DIAGNOSIS — Z9181 History of falling: Secondary | ICD-10-CM | POA: Insufficient documentation

## 2013-08-31 MED ORDER — HYDROCODONE-ACETAMINOPHEN 5-325 MG PO TABS
1.0000 | ORAL_TABLET | Freq: Once | ORAL | Status: AC
  Administered 2013-08-31: 1 via ORAL
  Filled 2013-08-31: qty 1

## 2013-08-31 MED ORDER — HYDROCODONE-ACETAMINOPHEN 5-325 MG PO TABS: 1.0000 | ORAL_TABLET | Freq: Four times a day (QID) | ORAL | Status: DC | PRN

## 2013-08-31 NOTE — ED Provider Notes (Signed)
CSN: [id number]     Arrival date & time 08/31/13  1817 History  This chart was scribed for non-physician practitioner, Teressa Lower, FNP,working with Candyce Churn, MD, by Karle Plumber, ED Scribe.  This patient was seen in room TR06C/TR06C and the patient's care was started at 6:41 PM.  Chief Complaint  Patient presents with  . Breast Pain   The history is provided by the patient. No language interpreter was used.   HPI Comments:  Jasmin Ellis is a 22 y.o. female who presents to the Emergency Department complaining of new onset, moderate left breast pain onset this morning. Pt denies drainage or discharge. She denies breastfeeding and states her last period was 08/09/13.   Past Medical History  Diagnosis Date  . Back pain, chronic     s/p fall 8 years ago  . Fibroids    History reviewed. No pertinent past surgical history. No family history on file. History  Substance Use Topics  . Smoking status: Never Smoker   . Smokeless tobacco: Never Used  . Alcohol Use: No   OB History   Grav Para Term Preterm Abortions TAB SAB Ect Mult Living   1 1 1  0 0 0 0 0 0 1     Review of Systems  Skin: Positive for color change. Negative for wound.       Left breast pain, mildly erythematous.   All other systems reviewed and are negative.    Allergies  Review of patient's allergies indicates no known allergies.  Home Medications   Current Outpatient Rx  Name  Route  Sig  Dispense  Refill  . acetaminophen (TYLENOL) 325 MG tablet   Oral   Take 650 mg by mouth every 6 (six) hours as needed for pain or fever.          Triage Vitals: BP 134/78  Pulse 105  Temp(Src) 98.1 F (36.7 C) (Oral)  Resp 16  Wt 202 lb 9.6 oz (91.899 kg)  SpO2 100%  LMP 08/09/2013 Physical Exam  Nursing note and vitals reviewed. Constitutional: She is oriented to person, place, and time. She appears well-developed and well-nourished. No distress.  HENT:  Head: Normocephalic and  atraumatic.  Eyes: Conjunctivae are normal. No scleral icterus.  Neck: Neck supple.  Cardiovascular: Normal rate and intact distal pulses.   Pulmonary/Chest: Effort normal. No stridor. No respiratory distress.  At 8:00 position there is a firm, movable mass. No redness noted to the area No discharge noted.   Abdominal: Normal appearance. She exhibits no distension.  Neurological: She is alert and oriented to person, place, and time.  Skin: Skin is warm and dry. No rash noted.  Psychiatric: She has a normal mood and affect. Her behavior is normal.    ED Course  Procedures (including critical care time) DIAGNOSTIC STUDIES: Oxygen Saturation is 100% on RA, normal by my interpretation.   COORDINATION OF CARE: 6:44 PM- Will talk with Dr. Loretha Stapler on appropriate course of treatment. Pt verbalizes understanding and agrees to plan.  Medications - No data to display  Labs Review Labs Reviewed  POCT PREGNANCY, URINE   Imaging Review No results found.  EKG Interpretation   None       MDM   1. Breast mass in female    Pt was evaluated by myself and dr wofford:pt is to follow up with the breast center:nothing at this point suggests infection:pt given hydrocodone  I personally performed the services described in this documentation, which was scribed  in my presence. The recorded information has been reviewed and is accurate.     Teressa Lower, NP 08/31/13 1945

## 2013-08-31 NOTE — ED Notes (Signed)
Pt c/o left breast pain onset today. Pt denies drainage. Hardened Area in left breast noted. Pt has not taken any pain medication today.

## 2013-09-01 NOTE — ED Provider Notes (Signed)
Medical screening examination/treatment/procedure(s) were conducted as a shared visit with non-physician practitioner(s) and myself.  I personally evaluated the patient during the encounter.  EKG Interpretation   None       22 yo female with left breast mass and tenderness which began today.  On exam, 3cm x 2 cm firm mass in lower medial aspect of left breast.  TTP.  No nipple discharge, no fluctuance.  No fevers, no systemic symptoms.  She will follow up with breast center.  Return precautions given.    Clinical Impression: 1. Breast mass in female       Candyce Churn, MD 09/01/13 [phone]

## 2013-09-01 NOTE — ED Provider Notes (Signed)
Medical screening examination/treatment/procedure(s) were conducted as a shared visit with non-physician practitioner(s) and myself.  I personally evaluated the patient during the encounter.  EKG Interpretation   None         Candyce Churn, MD 09/01/13 [phone]

## 2014-04-21 ENCOUNTER — Emergency Department (HOSPITAL_COMMUNITY): Payer: BC Managed Care – PPO

## 2014-04-21 ENCOUNTER — Encounter (HOSPITAL_COMMUNITY): Payer: Self-pay | Admitting: Emergency Medicine

## 2014-04-21 ENCOUNTER — Emergency Department (HOSPITAL_COMMUNITY)
Admission: EM | Admit: 2014-04-21 | Discharge: 2014-04-21 | Disposition: A | Discharge status: Home / Self Care | Payer: BC Managed Care – PPO | Attending: Emergency Medicine | Admitting: Emergency Medicine

## 2014-04-21 DIAGNOSIS — Z8742 Personal history of other diseases of the female genital tract: Secondary | ICD-10-CM | POA: Insufficient documentation

## 2014-04-21 DIAGNOSIS — M545 Low back pain, unspecified: Secondary | ICD-10-CM | POA: Insufficient documentation

## 2014-04-21 DIAGNOSIS — G8929 Other chronic pain: Secondary | ICD-10-CM | POA: Insufficient documentation

## 2014-04-21 MED ORDER — KETOROLAC TROMETHAMINE 60 MG/2ML IM SOLN
60.0000 mg | Freq: Once | INTRAMUSCULAR | Status: AC
  Administered 2014-04-21: 60 mg via INTRAMUSCULAR
  Filled 2014-04-21: qty 2

## 2014-04-21 MED ORDER — HYDROCODONE-ACETAMINOPHEN 5-325 MG PO TABS: 1.0000 | ORAL_TABLET | Freq: Four times a day (QID) | ORAL | Status: DC | PRN

## 2014-04-21 NOTE — Discharge Instructions (Signed)

## 2014-04-21 NOTE — ED Notes (Signed)
Pt in c/o lower back pain after a fall last night, states the pain is worse with ambulation and that she hears a pop when walking

## 2014-04-21 NOTE — ED Provider Notes (Signed)
CSN: [id number]     Arrival date & time 04/21/14  1602 History  This chart was scribed for non-physician practitioner, Montine Circle, PA-C working with Dorie Rank, MD by Einar Pheasant, ED scribe. This patient was seen in room TR06C/TR06C and the patient's care was started at 4:52 PM.      Chief Complaint  Patient presents with  . Back Pain   The history is provided by the patient. No language interpreter was used.   HPI Comments: Jasmin Ellis is a 23 y.o. female who presents to the Emergency Department with a chief complaint of worsening back pain that started approximately 1 week ago. Pt states that she had a really bad fall when she was in [place] years ago but she didn't take the pain seriously.  Pt states that the the pain is worsened by walking and movement. Pt states she has felt warm, but no fever.  Denies any IV drug use, numbness, tingling, nausea, emesis, SOB, chest pain, or diaphoresis. Denies any bowel or bladder incontinence.  Past Medical History  Diagnosis Date  . Back pain, chronic     s/p fall 8 years ago  . Fibroids    History reviewed. No pertinent past surgical history. History reviewed. No pertinent family history. History  Substance Use Topics  . Smoking status: Never Smoker   . Smokeless tobacco: Never Used  . Alcohol Use: No   OB History   Grav Para Term Preterm Abortions TAB SAB Ect Mult Living   1 1 1  0 0 0 0 0 0 1     Review of Systems  Constitutional: Negative for fever and diaphoresis.  HENT: Negative for congestion.   Respiratory: Negative for cough and shortness of breath.   Cardiovascular: Negative for chest pain.  Gastrointestinal: Negative for nausea and vomiting.  Musculoskeletal: Positive for back pain.  Skin: Negative for rash.  Neurological: Negative for weakness.      Allergies  Review of patient's allergies indicates no known allergies.  Home Medications   Prior to Admission medications   Medication Sig Start Date End Date  Taking? Authorizing Provider  acetaminophen (TYLENOL) 325 MG tablet Take 650 mg by mouth every 6 (six) hours as needed for pain or fever.   Yes Historical Provider, MD  HYDROcodone-acetaminophen (NORCO/VICODIN) 5-325 MG per tablet Take 1 tablet by mouth every 6 (six) hours as needed for moderate pain.   Yes Historical Provider, MD   BP 141/91  Pulse 86  Temp(Src) 98 F (36.7 C) (Oral)  Resp 20  Wt 189 lb 14.4 oz (86.138 kg)  SpO2 100%  Physical Exam  Nursing note and vitals reviewed. Constitutional: She is oriented to person, place, and time. She appears well-developed and well-nourished. No distress.  HENT:  Head: Normocephalic and atraumatic.  Eyes: Conjunctivae and EOM are normal. Right eye exhibits no discharge. Left eye exhibits no discharge. No scleral icterus.  Neck: Normal range of motion. Neck supple. No tracheal deviation present.  Cardiovascular: Normal rate, regular rhythm and normal heart sounds.  Exam reveals no gallop and no friction rub.   No murmur heard. Pulmonary/Chest: Effort normal and breath sounds normal. No respiratory distress. She has no wheezes.  Abdominal: Soft. She exhibits no distension. There is no tenderness.  Musculoskeletal: Normal range of motion.  Lumbar paraspinal muscles tender to palpation, no bony tenderness, step-offs, or gross abnormality or deformity of spine, patient is able to ambulate, moves all extremities  Bilateral great toe extension intact Bilateral plantar/dorsiflexion intact  Neurological: She is alert and oriented to person, place, and time. She has normal reflexes.  Sensation and strength intact bilaterally Symmetrical reflexes  Skin: Skin is warm. She is not diaphoretic.  Psychiatric: She has a normal mood and affect. Her behavior is normal. Judgment and thought content normal.    ED Course  Procedures (including critical care time)  DIAGNOSTIC STUDIES: Oxygen Saturation is 100% on RA, normal by my interpretation.     COORDINATION OF CARE: 4:56 PM- Pt advised of plan for treatment and pt agrees. Will order X-ray of pt's back.  Labs Review Labs Reviewed - No data to display  Imaging Review Dg Lumbar Spine Complete  04/21/2014   CLINICAL DATA:  Posterior lower back pain for several years post remote fall, limited range of motion, stiffness  EXAM: LUMBAR SPINE - COMPLETE 4+ VIEW  COMPARISON:  None  FINDINGS: Five non-rib-bearing lumbar vertebrae.  Minimal rotary dextro convex lumbar scoliosis.  Vertebral body and disc space heights maintained.  No acute fracture, subluxation or bone destruction.  No spondylolysis.  SI joints preserved.  IMPRESSION: No acute abnormalities.  Minimal rotary scoliosis.   Electronically Signed   By: Lavonia Dana M.D.   On: 04/21/2014 17:47     EKG Interpretation None      MDM   Final diagnoses:  Midline low back pain without sciatica    Patient with back pain.  No neurological deficits and normal neuro exam.  Patient is ambulatory.  No loss of bowel or bladder control.  Doubt cauda equina.  Denies fever,  doubt epidural abscess or other lesion. Recommend back exercises, stretching, RICE, and will treat with a short course of norco.    I personally performed the services described in this documentation, which was scribed in my presence. The recorded information has been reviewed and is accurate.    Montine Circle, PA-C 04/21/14 1934

## 2014-04-22 NOTE — ED Provider Notes (Signed)
Medical screening examination/treatment/procedure(s) were performed by non-physician practitioner and as supervising physician I was immediately available for consultation/collaboration.    Dorie Rank, MD 04/22/14 [phone]

## 2014-06-03 ENCOUNTER — Encounter (HOSPITAL_COMMUNITY): Payer: Self-pay | Admitting: Emergency Medicine

## 2014-06-03 ENCOUNTER — Emergency Department (HOSPITAL_COMMUNITY)
Admission: EM | Admit: 2014-06-03 | Discharge: 2014-06-03 | Disposition: A | Discharge status: Home / Self Care | Payer: BC Managed Care – PPO | Attending: Emergency Medicine | Admitting: Emergency Medicine

## 2014-06-03 DIAGNOSIS — IMO0001 Reserved for inherently not codable concepts without codable children: Secondary | ICD-10-CM | POA: Diagnosis not present

## 2014-06-03 DIAGNOSIS — T50905A Adverse effect of unspecified drugs, medicaments and biological substances, initial encounter: Secondary | ICD-10-CM

## 2014-06-03 DIAGNOSIS — R5383 Other fatigue: Secondary | ICD-10-CM

## 2014-06-03 DIAGNOSIS — G8929 Other chronic pain: Secondary | ICD-10-CM | POA: Diagnosis not present

## 2014-06-03 DIAGNOSIS — R5381 Other malaise: Secondary | ICD-10-CM | POA: Insufficient documentation

## 2014-06-03 DIAGNOSIS — Z79899 Other long term (current) drug therapy: Secondary | ICD-10-CM | POA: Diagnosis not present

## 2014-06-03 DIAGNOSIS — E119 Type 2 diabetes mellitus without complications: Secondary | ICD-10-CM | POA: Diagnosis not present

## 2014-06-03 DIAGNOSIS — R197 Diarrhea, unspecified: Secondary | ICD-10-CM | POA: Insufficient documentation

## 2014-06-03 DIAGNOSIS — T383X5A Adverse effect of insulin and oral hypoglycemic [antidiabetic] drugs, initial encounter: Secondary | ICD-10-CM | POA: Insufficient documentation

## 2014-06-03 DIAGNOSIS — Z3202 Encounter for pregnancy test, result negative: Secondary | ICD-10-CM | POA: Diagnosis not present

## 2014-06-03 HISTORY — DX: Type 2 diabetes mellitus without complications: E11.9

## 2014-06-03 LAB — CBC WITH DIFFERENTIAL/PLATELET
BASOS ABS: 0.1 10*3/uL (ref 0.0–0.1)
BASOS PCT: 1 % (ref 0–1)
EOS PCT: 7 % — AB (ref 0–5)
Eosinophils Absolute: 0.4 10*3/uL (ref 0.0–0.7)
HEMATOCRIT: 33.5 % — AB (ref 36.0–46.0)
Hemoglobin: 11.5 g/dL — ABNORMAL LOW (ref 12.0–15.0)
LYMPHS PCT: 28 % (ref 12–46)
Lymphs Abs: 1.9 10*3/uL (ref 0.7–4.0)
MCH: 28.8 pg (ref 26.0–34.0)
MCHC: 34.3 g/dL (ref 30.0–36.0)
MCV: 83.8 fL (ref 78.0–100.0)
MONO ABS: 0.3 10*3/uL (ref 0.1–1.0)
Monocytes Relative: 4 % (ref 3–12)
NEUTROS ABS: 4 10*3/uL (ref 1.7–7.7)
Neutrophils Relative %: 60 % (ref 43–77)
PLATELETS: 342 10*3/uL (ref 150–400)
RBC: 4 MIL/uL (ref 3.87–5.11)
RDW: 12.5 % (ref 11.5–15.5)
WBC: 6.6 10*3/uL (ref 4.0–10.5)

## 2014-06-03 LAB — COMPREHENSIVE METABOLIC PANEL
ALBUMIN: 3.8 g/dL (ref 3.5–5.2)
ALT: 8 U/L (ref 0–35)
AST: 13 U/L (ref 0–37)
Alkaline Phosphatase: 45 U/L (ref 39–117)
Anion gap: 11 (ref 5–15)
BUN: 6 mg/dL (ref 6–23)
CALCIUM: 9.8 mg/dL (ref 8.4–10.5)
CO2: 26 meq/L (ref 19–32)
CREATININE: 0.65 mg/dL (ref 0.50–1.10)
Chloride: 102 mEq/L (ref 96–112)
GFR calc Af Amer: >90 mL/min (ref >90)
GFR calc non Af Amer: >90 mL/min (ref >90)
Glucose, Bld: 136 mg/dL — ABNORMAL HIGH (ref 70–99)
Potassium: 3.8 mEq/L (ref 3.7–5.3)
SODIUM: 139 meq/L (ref 137–147)
TOTAL PROTEIN: 7.4 g/dL (ref 6.0–8.3)
Total Bilirubin: <0.2 mg/dL — ABNORMAL LOW (ref 0.3–1.2)

## 2014-06-03 LAB — POC URINE PREG, ED: PREG TEST UR: NEGATIVE

## 2014-06-03 LAB — URINALYSIS, ROUTINE W REFLEX MICROSCOPIC
Bilirubin Urine: NEGATIVE
GLUCOSE, UA: NEGATIVE mg/dL
Hgb urine dipstick: NEGATIVE
KETONES UR: NEGATIVE mg/dL
LEUKOCYTES UA: NEGATIVE
NITRITE: NEGATIVE
PH: 7.5 (ref 5.0–8.0)
Protein, ur: NEGATIVE mg/dL
SPECIFIC GRAVITY, URINE: 1.009 (ref 1.005–1.030)
Urobilinogen, UA: 0.2 mg/dL (ref 0.0–1.0)

## 2014-06-03 NOTE — Discharge Instructions (Signed)
Avoid excess intake of starchy foods while on metformin, as this may worsen your symptoms.

## 2014-06-03 NOTE — ED Provider Notes (Signed)
CSN: [id number]     Arrival date & time 06/03/14  1403 History   First MD Initiated Contact with Patient 06/03/14 1707     Chief Complaint  Patient presents with  . Weakness     (Consider location/radiation/quality/duration/timing/severity/associated sxs/prior Treatment) HPI  Jasmin Ellis is a 23 y.o. female with a history of diabetes, presenting for generalized weakness, and diarrhea. Patient's symptoms have been occurring since she started metformin. She was feeling weak prior to starting metformin as well, was told by her primary physician that she had diabetes, and this is likely contributing to her weakness. She denies any chest pain, shortness of breath, nausea, abdominal pain, vaginal complaints, vomiting, fever, chills, diaphoresis, palpitations.  Patient saw evaluation ED due to the symptoms related to starting metformin. She has a fall with her primary physician in one week, but felt she couldn't wait that long, not knowing what was going on. Patient's symptoms are generalized, no significant pain. She endorses intermittent knee pain which has been chronic and ongoing, which appears unrelated to current chief concern. Endorses intermittent "popping" with walking and bending her knees.  No associated, fevers, chills, or warmth.  She has not tried any medication for this.  No difficulty with ambulation.     Past Medical History  Diagnosis Date  . Back pain, chronic     s/p fall 8 years ago  . Fibroids   . Diabetes mellitus without complication    History reviewed. No pertinent past surgical history. No family history on file. History  Substance Use Topics  . Smoking status: Never Smoker   . Smokeless tobacco: Never Used  . Alcohol Use: No   OB History   Grav Para Term Preterm Abortions TAB SAB Ect Mult Living   1 1 1  0 0 0 0 0 0 1     Review of Systems  Constitutional: Positive for fatigue.  HENT: Negative.   Eyes: Negative.   Respiratory: Negative.    Cardiovascular: Negative.   Gastrointestinal: Positive for diarrhea.  Endocrine: Negative.   Genitourinary: Negative.   Musculoskeletal: Positive for arthralgias.  Skin: Negative.   Allergic/Immunologic: Negative.   Neurological: Positive for weakness.  Hematological: Negative.   Psychiatric/Behavioral: Negative.       Allergies  Review of patient's allergies indicates no known allergies.  Home Medications   Prior to Admission medications   Medication Sig Start Date End Date Taking? Authorizing Provider  acetaminophen (TYLENOL) 325 MG tablet Take 650 mg by mouth every 6 (six) hours as needed for pain or fever.   Yes Historical Provider, MD  HYDROcodone-acetaminophen (NORCO/VICODIN) 5-325 MG per tablet Take 1 tablet by mouth every 6 (six) hours as needed for moderate pain.   Yes Historical Provider, MD  metFORMIN (GLUCOPHAGE) 500 MG tablet Take 500 mg by mouth 2 (two) times daily with a meal.   Yes Historical Provider, MD   BP 134/78  Pulse 65  Temp(Src) 98.3 F (36.8 C) (Oral)  Resp 15  SpO2 100% Physical Exam  Nursing note and vitals reviewed. Constitutional: She is oriented to person, place, and time. She appears well-developed and well-nourished. No distress.  HENT:  Head: Normocephalic and atraumatic.  Right Ear: External ear normal.  Left Ear: External ear normal.  Nose: Nose normal.  Mouth/Throat: Oropharynx is clear and moist. No oropharyngeal exudate.  Eyes: Conjunctivae and EOM are normal. Pupils are equal, round, and reactive to light. Right eye exhibits no discharge. Left eye exhibits no discharge. No scleral icterus.  Neck:  Normal range of motion. Neck supple. No JVD present. No tracheal deviation present. No thyromegaly present.  Cardiovascular: Normal rate, regular rhythm, normal heart sounds and intact distal pulses.  Exam reveals no gallop and no friction rub.   No murmur heard. Pulmonary/Chest: Effort normal and breath sounds normal. No stridor. No  respiratory distress. She has no wheezes. She has no rales. She exhibits no tenderness.  Abdominal: Soft. Bowel sounds are normal. She exhibits no distension. There is no tenderness. There is no rebound and no guarding.  Musculoskeletal: Normal range of motion. She exhibits no edema and no tenderness.  Lymphadenopathy:    She has no cervical adenopathy.  Neurological: She is alert and oriented to person, place, and time. She has normal strength and normal reflexes. She is not disoriented. She displays no atrophy, no tremor and normal reflexes. No cranial nerve deficit or sensory deficit. She exhibits normal muscle tone. She displays no seizure activity. Coordination and gait normal. GCS eye subscore is 4. GCS verbal subscore is 5. GCS motor subscore is 6.  Skin: Skin is warm and dry. No rash noted. She is not diaphoretic. No erythema. No pallor.  Psychiatric: She has a normal mood and affect. Her behavior is normal. Judgment and thought content normal.    ED Course  Procedures (including critical care time) Labs Review Labs Reviewed  CBC WITH DIFFERENTIAL - Abnormal; Notable for the following:    Hemoglobin 11.5 (*)    HCT 33.5 (*)    Eosinophils Relative 7 (*)    All other components within normal limits  COMPREHENSIVE METABOLIC PANEL - Abnormal; Notable for the following:    Glucose, Bld 136 (*)    Total Bilirubin <0.2 (*)    All other components within normal limits  URINALYSIS, ROUTINE W REFLEX MICROSCOPIC  POC URINE PREG, ED    Imaging Review No results found.   EKG Interpretation None      MDM   Final diagnoses:  Medication side effect, initial encounter  Type 2 diabetes mellitus without complication    Upon further discussion patient eats an ethnic African diet with numerous carbohydrates and sources of starches, she does not eat very many green vegetables. Symptoms likely appear consistent with typical metformin side effects, especially with her high carbohydrate  intake. Patient does not have access to the Internet to look up dietary guidance, and appears to have a limited understanding of what foods are most appropriate with diabetes, and with using metformin. Low concern for acute infectious process or other significant pathology to account for her symptoms. She is young and appears otherwise healthy. Screening laboratory workup including urine studies, and basic labs does not show any acute findings accounting for her weakness, and diarrhea. She is mildly anemic. Have advised her to follow up with her primary care physician for additional recommendations, and will give additional information for dietary suggestions to reduce her metformin side effects, and hopefully decrease her diarrhea. No other acute medical concerns to be addressed at this time, and she will seek further advice from her primary physician at her followup visit next week.  Patient given return precautions for diabetes, and weakness.  Advised to return for worsening symptoms including chest pain, shortness of breath, severe headache, intractable nausea or vomiting, fever, or chills, inability to take medications, or other acute concerns.  Advised to follow up with PCP in 1 week as scheduled.  Patient in agreement with and expressed understanding of follow plan, plan of care, and return precautions.  All questions answered prior to discharge.  Patient was discharged in stable condition, ambulating without difficulty.  Patient care was discussed with my attending, Dr. Tawnya Crook.     Hoyle Sauer, MD 06/04/14 [phone]

## 2014-06-03 NOTE — ED Notes (Addendum)
Was told last month she had diabetes  And she was given meds but she states that she has felt weak x 1 weel has appointment on 8/26 but cannot wait has had diarrhea states she has been taking her meds and her sugar was 85 today no vomiting

## 2014-06-04 NOTE — ED Provider Notes (Signed)
Medical screening examination/treatment/procedure(s) were conducted as a shared visit with resident-physician practitioner(s) and myself.  I personally evaluated the patient during the encounter.  Pt is a 23 y.o. female with pmhx as above presenting with generalized weakness and d/a since starting metformin for newly diagnosed DM.  She is well appearing on PE. Labs and urine grossly unremarkable. Will d/c home w/ instruction to f/u with PCP.   Results for orders placed during the hospital encounter of 06/03/14  CBC WITH DIFFERENTIAL      Result Value Ref Range   WBC 6.6  4.0 - 10.5 K/uL   RBC 4.00  3.87 - 5.11 MIL/uL   Hemoglobin 11.5 (*) 12.0 - 15.0 g/dL   HCT 33.5 (*) 36.0 - 46.0 %   MCV 83.8  78.0 - 100.0 fL   MCH 28.8  26.0 - 34.0 pg   MCHC 34.3  30.0 - 36.0 g/dL   RDW 12.5  11.5 - 15.5 %   Platelets 342  150 - 400 K/uL   Neutrophils Relative % 60  43 - 77 %   Neutro Abs 4.0  1.7 - 7.7 K/uL   Lymphocytes Relative 28  12 - 46 %   Lymphs Abs 1.9  0.7 - 4.0 K/uL   Monocytes Relative 4  3 - 12 %   Monocytes Absolute 0.3  0.1 - 1.0 K/uL   Eosinophils Relative 7 (*) 0 - 5 %   Eosinophils Absolute 0.4  0.0 - 0.7 K/uL   Basophils Relative 1  0 - 1 %   Basophils Absolute 0.1  0.0 - 0.1 K/uL  COMPREHENSIVE METABOLIC PANEL      Result Value Ref Range   Sodium 139  137 - 147 mEq/L   Potassium 3.8  3.7 - 5.3 mEq/L   Chloride 102  96 - 112 mEq/L   CO2 26  19 - 32 mEq/L   Glucose, Bld 136 (*) 70 - 99 mg/dL   BUN 6  6 - 23 mg/dL   Creatinine, Ser 0.65  0.50 - 1.10 mg/dL   Calcium 9.8  8.4 - 10.5 mg/dL   Total Protein 7.4  6.0 - 8.3 g/dL   Albumin 3.8  3.5 - 5.2 g/dL   AST 13  0 - 37 U/L   ALT 8  0 - 35 U/L   Alkaline Phosphatase 45  39 - 117 U/L   Total Bilirubin <0.2 (*) 0.3 - 1.2 mg/dL   GFR calc non Af Amer >90  >90 mL/min   GFR calc Af Amer >90  >90 mL/min   Anion gap 11  5 - 15  URINALYSIS, ROUTINE W REFLEX MICROSCOPIC      Result Value Ref Range   Color, Urine YELLOW  YELLOW    APPearance CLEAR  CLEAR   Specific Gravity, Urine 1.009  1.005 - 1.030   pH 7.5  5.0 - 8.0   Glucose, UA NEGATIVE  NEGATIVE mg/dL   Hgb urine dipstick NEGATIVE  NEGATIVE   Bilirubin Urine NEGATIVE  NEGATIVE   Ketones, ur NEGATIVE  NEGATIVE mg/dL   Protein, ur NEGATIVE  NEGATIVE mg/dL   Urobilinogen, UA 0.2  0.0 - 1.0 mg/dL   Nitrite NEGATIVE  NEGATIVE   Leukocytes, UA NEGATIVE  NEGATIVE  POC URINE PREG, ED      Result Value Ref Range   Preg Test, Ur NEGATIVE  NEGATIVE     Ernestina Patches, MD 06/04/14 [id number]

## 2014-06-26 ENCOUNTER — Ambulatory Visit: Payer: BC Managed Care – PPO | Admitting: Obstetrics & Gynecology

## 2014-07-29 ENCOUNTER — Inpatient Hospital Stay (HOSPITAL_COMMUNITY): Payer: BC Managed Care – PPO

## 2014-07-29 ENCOUNTER — Encounter (HOSPITAL_COMMUNITY): Payer: Self-pay

## 2014-07-29 ENCOUNTER — Inpatient Hospital Stay (HOSPITAL_COMMUNITY)
Admission: AD | Admit: 2014-07-29 | Discharge: 2014-07-29 | Disposition: A | Discharge status: Home / Self Care | Payer: BC Managed Care – PPO | Source: Ambulatory Visit | Attending: Obstetrics & Gynecology | Admitting: Obstetrics & Gynecology

## 2014-07-29 DIAGNOSIS — O9989 Other specified diseases and conditions complicating pregnancy, childbirth and the puerperium: Secondary | ICD-10-CM | POA: Insufficient documentation

## 2014-07-29 DIAGNOSIS — O24111 Pre-existing diabetes mellitus, type 2, in pregnancy, first trimester: Secondary | ICD-10-CM | POA: Insufficient documentation

## 2014-07-29 DIAGNOSIS — E119 Type 2 diabetes mellitus without complications: Secondary | ICD-10-CM | POA: Diagnosis not present

## 2014-07-29 DIAGNOSIS — R109 Unspecified abdominal pain: Secondary | ICD-10-CM | POA: Insufficient documentation

## 2014-07-29 DIAGNOSIS — O26899 Other specified pregnancy related conditions, unspecified trimester: Secondary | ICD-10-CM

## 2014-07-29 DIAGNOSIS — O3671X1 Maternal care for viable fetus in abdominal pregnancy, first trimester, fetus 1: Secondary | ICD-10-CM

## 2014-07-29 DIAGNOSIS — Z3A01 Less than 8 weeks gestation of pregnancy: Secondary | ICD-10-CM | POA: Insufficient documentation

## 2014-07-29 LAB — CBC WITH DIFFERENTIAL/PLATELET
Basophils Absolute: 0 10*3/uL (ref 0.0–0.1)
Basophils Relative: 0 % (ref 0–1)
EOS PCT: 6 % — AB (ref 0–5)
Eosinophils Absolute: 0.5 10*3/uL (ref 0.0–0.7)
HCT: 33.6 % — ABNORMAL LOW (ref 36.0–46.0)
HEMOGLOBIN: 11.6 g/dL — AB (ref 12.0–15.0)
LYMPHS PCT: 35 % (ref 12–46)
Lymphs Abs: 2.7 10*3/uL (ref 0.7–4.0)
MCH: 29.1 pg (ref 26.0–34.0)
MCHC: 34.5 g/dL (ref 30.0–36.0)
MCV: 84.2 fL (ref 78.0–100.0)
MONOS PCT: 6 % (ref 3–12)
Monocytes Absolute: 0.5 10*3/uL (ref 0.1–1.0)
NEUTROS ABS: 4 10*3/uL (ref 1.7–7.7)
Neutrophils Relative %: 53 % (ref 43–77)
Platelets: 271 10*3/uL (ref 150–400)
RBC: 3.99 MIL/uL (ref 3.87–5.11)
RDW: 13.1 % (ref 11.5–15.5)
WBC: 7.7 10*3/uL (ref 4.0–10.5)

## 2014-07-29 LAB — URINALYSIS, ROUTINE W REFLEX MICROSCOPIC
BILIRUBIN URINE: NEGATIVE
Glucose, UA: NEGATIVE mg/dL
Hgb urine dipstick: NEGATIVE
Ketones, ur: NEGATIVE mg/dL
LEUKOCYTES UA: NEGATIVE
NITRITE: NEGATIVE
PH: 7 (ref 5.0–8.0)
Protein, ur: NEGATIVE mg/dL
SPECIFIC GRAVITY, URINE: 1.01 (ref 1.005–1.030)
UROBILINOGEN UA: 0.2 mg/dL (ref 0.0–1.0)

## 2014-07-29 LAB — WET PREP, GENITAL
Clue Cells Wet Prep HPF POC: NONE SEEN
Trich, Wet Prep: NONE SEEN
YEAST WET PREP: NONE SEEN

## 2014-07-29 LAB — GLUCOSE, CAPILLARY: GLUCOSE-CAPILLARY: 93 mg/dL (ref 70–99)

## 2014-07-29 LAB — POCT PREGNANCY, URINE: PREG TEST UR: POSITIVE — AB

## 2014-07-29 LAB — HIV ANTIBODY (ROUTINE TESTING W REFLEX): HIV 1&2 Ab, 4th Generation: NONREACTIVE

## 2014-07-29 LAB — HCG, QUANTITATIVE, PREGNANCY: hCG, Beta Chain, Quant, S: 36265 m[IU]/mL — ABNORMAL HIGH (ref <5)

## 2014-07-29 MED ORDER — OBSTETRIX EC 29-1 MG PO TABS: 1.0000 | ORAL_TABLET | ORAL | Status: DC

## 2014-07-29 NOTE — Discharge Instructions (Signed)
Abdominal Pain During Pregnancy Abdominal pain is common in pregnancy. Most of the time, it does not cause harm. There are many causes of abdominal pain. Some causes are more serious than others. Some of the causes of abdominal pain in pregnancy are easily diagnosed. Occasionally, the diagnosis takes time to understand. Other times, the cause is not determined. Abdominal pain can be a sign that something is very wrong with the pregnancy, or the pain may have nothing to do with the pregnancy at all. For this reason, always tell your health care provider if you have any abdominal discomfort. HOME CARE INSTRUCTIONS  Monitor your abdominal pain for any changes. The following actions may help to alleviate any discomfort you are experiencing:  Do not have sexual intercourse or put anything in your vagina until your symptoms go away completely.  Get plenty of rest until your pain improves.  Drink clear fluids if you feel nauseous. Avoid solid food as long as you are uncomfortable or nauseous.  Only take over-the-counter or prescription medicine as directed by your health care provider.  Keep all follow-up appointments with your health care provider. SEEK IMMEDIATE MEDICAL CARE IF:  You are bleeding, leaking fluid, or passing tissue from the vagina.  You have increasing pain or cramping.  You have persistent vomiting.  You have painful or bloody urination.  You have a fever.  You notice a decrease in your baby's movements.  You have extreme weakness or feel faint.  You have shortness of breath, with or without abdominal pain.  You develop a severe headache with abdominal pain.  You have abnormal vaginal discharge with abdominal pain.  You have persistent diarrhea.  You have abdominal pain that continues even after rest, or gets worse. MAKE SURE YOU:   Understand these instructions.  Will watch your condition.  Will get help right away if you are not doing well or get  worse. Document Released: 10/03/2005 Document Revised: 07/24/2013 Document Reviewed: 05/02/2013 [facility] Patient Information 2015 [place], [place]. This information is not intended to replace advice given to you by your health care provider. Make sure you discuss any questions you have with your health care provider. First Trimester of Pregnancy The first trimester of pregnancy is from week 1 until the end of week 12 (months 1 through 3). A week after a sperm fertilizes an egg, the egg will implant on the wall of the uterus. This embryo will begin to develop into a baby. Genes from you and your partner are forming the baby. The female genes determine whether the baby is a boy or a girl. At 6-8 weeks, the eyes and face are formed, and the heartbeat can be seen on ultrasound. At the end of 12 weeks, all the baby's organs are formed.  Now that you are pregnant, you will want to do everything you can to have a healthy baby. Two of the most important things are to get good prenatal care and to follow your health care provider's instructions. Prenatal care is all the medical care you receive before the baby's birth. This care will help prevent, find, and treat any problems during the pregnancy and childbirth. BODY CHANGES Your body goes through many changes during pregnancy. The changes vary from woman to woman.   You may gain or lose a couple of pounds at first.  You may feel sick to your stomach (nauseous) and throw up (vomit). If the vomiting is uncontrollable, call your health care provider.  You may tire easily.  You may develop headaches that can be relieved by medicines approved by your health care provider.  You may urinate more often. Painful urination may mean you have a bladder infection.  You may develop heartburn as a result of your pregnancy.  You may develop constipation because certain hormones are causing the muscles that push waste through your intestines to slow down.  You may  develop hemorrhoids or swollen, bulging veins (varicose veins).  Your breasts may begin to grow larger and become tender. Your nipples may stick out more, and the tissue that surrounds them (areola) may become darker.  Your gums may bleed and may be sensitive to brushing and flossing.  Dark spots or blotches (chloasma, mask of pregnancy) may develop on your face. This will likely fade after the baby is born.  Your menstrual periods will stop.  You may have a loss of appetite.  You may develop cravings for certain kinds of food.  You may have changes in your emotions from day to day, such as being excited to be pregnant or being concerned that something may go wrong with the pregnancy and baby.  You may have more vivid and strange dreams.  You may have changes in your hair. These can include thickening of your hair, rapid growth, and changes in texture. Some women also have hair loss during or after pregnancy, or hair that feels dry or thin. Your hair will most likely return to normal after your baby is born. WHAT TO EXPECT AT YOUR PRENATAL VISITS During a routine prenatal visit:  You will be weighed to make sure you and the baby are growing normally.  Your blood pressure will be taken.  Your abdomen will be measured to track your baby's growth.  The fetal heartbeat will be listened to starting around week 10 or 12 of your pregnancy.  Test results from any previous visits will be discussed. Your health care provider may ask you:  How you are feeling.  If you are feeling the baby move.  If you have had any abnormal symptoms, such as leaking fluid, bleeding, severe headaches, or abdominal cramping.  If you have any questions. Other tests that may be performed during your first trimester include:  Blood tests to find your blood type and to check for the presence of any previous infections. They will also be used to check for low iron levels (anemia) and Rh antibodies. Later in  the pregnancy, blood tests for diabetes will be done along with other tests if problems develop.  Urine tests to check for infections, diabetes, or protein in the urine.  An ultrasound to confirm the proper growth and development of the baby.  An amniocentesis to check for possible genetic problems.  Fetal screens for spina bifida and Down syndrome.  You may need other tests to make sure you and the baby are doing well. HOME CARE INSTRUCTIONS  Medicines  Follow your health care provider's instructions regarding medicine use. Specific medicines may be either safe or unsafe to take during pregnancy.  Take your prenatal vitamins as directed.  If you develop constipation, try taking a stool softener if your health care provider approves. Diet  Eat regular, well-balanced meals. Choose a variety of foods, such as meat or vegetable-based protein, fish, milk and low-fat dairy products, vegetables, fruits, and whole grain breads and cereals. Your health care provider will help you determine the amount of weight gain that is right for you.  Avoid raw meat and uncooked cheese. These  carry germs that can cause birth defects in the baby.  Eating four or five small meals rather than three large meals a day may help relieve nausea and vomiting. If you start to feel nauseous, eating a few soda crackers can be helpful. Drinking liquids between meals instead of during meals also seems to help nausea and vomiting.  If you develop constipation, eat more high-fiber foods, such as fresh vegetables or fruit and whole grains. Drink enough fluids to keep your urine clear or pale yellow. Activity and Exercise  Exercise only as directed by your health care provider. Exercising will help you:  Control your weight.  Stay in shape.  Be prepared for labor and delivery.  Experiencing pain or cramping in the lower abdomen or low back is a good sign that you should stop exercising. Check with your health care  provider before continuing normal exercises.  Try to avoid standing for long periods of time. Move your legs often if you must stand in one place for a long time.  Avoid heavy lifting.  Wear low-heeled shoes, and practice good posture.  You may continue to have sex unless your health care provider directs you otherwise. Relief of Pain or Discomfort  Wear a good support bra for breast tenderness.   Take warm sitz baths to soothe any pain or discomfort caused by hemorrhoids. Use hemorrhoid cream if your health care provider approves.   Rest with your legs elevated if you have leg cramps or low back pain.  If you develop varicose veins in your legs, wear support hose. Elevate your feet for 15 minutes, 3-4 times a day. Limit salt in your diet. Prenatal Care  Schedule your prenatal visits by the twelfth week of pregnancy. They are usually scheduled monthly at first, then more often in the last 2 months before delivery.  Write down your questions. Take them to your prenatal visits.  Keep all your prenatal visits as directed by your health care provider. Safety  Wear your seat belt at all times when driving.  Make a list of emergency phone numbers, including numbers for family, friends, the hospital, and police and fire departments. General Tips  Ask your health care provider for a referral to a local prenatal education class. Begin classes no later than at the beginning of month 6 of your pregnancy.  Ask for help if you have counseling or nutritional needs during pregnancy. Your health care provider can offer advice or refer you to specialists for help with various needs.  Do not use hot tubs, steam rooms, or saunas.  Do not douche or use tampons or scented sanitary pads.  Do not cross your legs for long periods of time.  Avoid cat litter boxes and soil used by cats. These carry germs that can cause birth defects in the baby and possibly loss of the fetus by miscarriage or  stillbirth.  Avoid all smoking, herbs, alcohol, and medicines not prescribed by your health care provider. Chemicals in these affect the formation and growth of the baby.  Schedule a dentist appointment. At home, brush your teeth with a soft toothbrush and be gentle when you floss. SEEK MEDICAL CARE IF:   You have dizziness.  You have mild pelvic cramps, pelvic pressure, or nagging pain in the abdominal area.  You have persistent nausea, vomiting, or diarrhea.  You have a bad smelling vaginal discharge.  You have pain with urination.  You notice increased swelling in your face, hands, legs, or ankles. SEEK  IMMEDIATE MEDICAL CARE IF:   You have a fever.  You are leaking fluid from your vagina.  You have spotting or bleeding from your vagina.  You have severe abdominal cramping or pain.  You have rapid weight gain or loss.  You vomit blood or material that looks like coffee grounds.  You are exposed to [place] measles and have never had them.  You are exposed to fifth disease or chickenpox.  You develop a severe headache.  You have shortness of breath.  You have any kind of trauma, such as from a fall or a car accident. Document Released: 09/27/2001 Document Revised: 02/17/2014 Document Reviewed: 08/13/2013 [facility] Patient Information 2015 [place], [place]. This information is not intended to replace advice given to you by your health care provider. Make sure you discuss any questions you have with your health care provider.

## 2014-07-29 NOTE — MAU Note (Signed)
Pt states she has beening having abdominal pain on and off for about 1 month.

## 2014-07-29 NOTE — MAU Note (Signed)
Patient states she was seen at her primary care MD yesterday for her diabetes and was told she was pregnant. Patient states she has not had a period since June. Has been having abdominal pain for a while. Denies bleeding or discharge. Has not felt fetal movement.

## 2014-07-29 NOTE — MAU Provider Note (Signed)
CSN: [id number]     Arrival date & time 07/29/14  1725 History   None    Chief Complaint  Patient presents with  . Possible Pregnancy  . Abdominal Pain     (Consider location/radiation/quality/duration/timing/severity/associated sxs/prior Treatment) Patient is a 23 y.o. female presenting with pregnancy problem and abdominal pain. The history is provided by the patient.  Possible Pregnancy This is a new problem. Associated symptoms include abdominal pain (cramping).  Abdominal Pain The primary symptoms of the illness include abdominal pain (cramping).   Mercede Diperna is a 23 y.o. G1P1001 who presents to MAU with abdominal pain. She went to her PCP yesterday and was told she was pregnant. She has not had a period since 05/2014. She went home from the doctor's office and took a home pregnancy test that was also positive. She is here today because she does not know how far along she is and has not felt any movement and is having some lower abdominal cramping.   Past Medical History  Diagnosis Date  . Back pain, chronic     s/p fall 8 years ago  . Fibroids   . Diabetes mellitus without complication    History reviewed. No pertinent past surgical history. History reviewed. No pertinent family history. History  Substance Use Topics  . Smoking status: Never Smoker   . Smokeless tobacco: Never Used  . Alcohol Use: No   OB History   Grav Para Term Preterm Abortions TAB SAB Ect Mult Living   2 1 1  0 0 0 0 0 0 1     Review of Systems  Gastrointestinal: Positive for abdominal pain (cramping).  Genitourinary:       Amenorrhea   all other systems negative    Allergies  Review of patient's allergies indicates no known allergies.  Home Medications   Prior to Admission medications   Medication Sig Start Date End Date Taking? Authorizing Provider  acetaminophen (TYLENOL) 325 MG tablet Take 650 mg by mouth every 6 (six) hours as needed for pain or fever.   Yes Historical  Provider, MD  metFORMIN (GLUCOPHAGE) 500 MG tablet Take 500 mg by mouth 2 (two) times daily with a meal.   Yes Historical Provider, MD   BP 133/66  Pulse 79  Temp(Src) 98.4 F (36.9 C)  Resp 16  Ht 5\' 3"  (1.6 m)  Wt 182 lb 6.4 oz (82.736 kg)  BMI 32.32 kg/m2  SpO2 100%  LMP 05/29/2014 Physical Exam  Nursing note and vitals reviewed. Constitutional: She is oriented to person, place, and time. She appears well-developed and well-nourished.  HENT:  Head: Normocephalic.  Eyes: EOM are normal.  Neck: Neck supple.  Cardiovascular: Normal rate.   Pulmonary/Chest: Effort normal.  Abdominal: Soft. There is tenderness.  Mildly tender with deep palpation lower abdomen.   Genitourinary:  External genitalia without lesions. Mucous discharge vaginal vault. Cervix long, closed, no CMT, no adnexal tenderness, uterus 6 to 8 week size.   Musculoskeletal: Normal range of motion.  Neurological: She is alert and oriented to person, place, and time. No cranial nerve deficit.  Skin: Skin is warm and dry.  Psychiatric: She has a normal mood and affect. Her behavior is normal.    ED Course  Procedures (including critical care time) Labs Review Results for orders placed during the hospital encounter of 07/29/14 (from the past 24 hour(s))  URINALYSIS, ROUTINE W REFLEX MICROSCOPIC     Status: None   Collection Time    07/29/14  5:45  PM      Result Value Ref Range   Color, Urine YELLOW  YELLOW   APPearance CLEAR  CLEAR   Specific Gravity, Urine 1.010  1.005 - 1.030   pH 7.0  5.0 - 8.0   Glucose, UA NEGATIVE  NEGATIVE mg/dL   Hgb urine dipstick NEGATIVE  NEGATIVE   Bilirubin Urine NEGATIVE  NEGATIVE   Ketones, ur NEGATIVE  NEGATIVE mg/dL   Protein, ur NEGATIVE  NEGATIVE mg/dL   Urobilinogen, UA 0.2  0.0 - 1.0 mg/dL   Nitrite NEGATIVE  NEGATIVE   Leukocytes, UA NEGATIVE  NEGATIVE  POCT PREGNANCY, URINE     Status: Abnormal   Collection Time    07/29/14  6:20 PM      Result Value Ref Range    Preg Test, Ur POSITIVE (*) NEGATIVE  CBC WITH DIFFERENTIAL     Status: Abnormal   Collection Time    07/29/14  6:35 PM      Result Value Ref Range   WBC 7.7  4.0 - 10.5 K/uL   RBC 3.99  3.87 - 5.11 MIL/uL   Hemoglobin 11.6 (*) 12.0 - 15.0 g/dL   HCT 33.6 (*) 36.0 - 46.0 %   MCV 84.2  78.0 - 100.0 fL   MCH 29.1  26.0 - 34.0 pg   MCHC 34.5  30.0 - 36.0 g/dL   RDW 13.1  11.5 - 15.5 %   Platelets 271  150 - 400 K/uL   Neutrophils Relative % 53  43 - 77 %   Neutro Abs 4.0  1.7 - 7.7 K/uL   Lymphocytes Relative 35  12 - 46 %   Lymphs Abs 2.7  0.7 - 4.0 K/uL   Monocytes Relative 6  3 - 12 %   Monocytes Absolute 0.5  0.1 - 1.0 K/uL   Eosinophils Relative 6 (*) 0 - 5 %   Eosinophils Absolute 0.5  0.0 - 0.7 K/uL   Basophils Relative 0  0 - 1 %   Basophils Absolute 0.0  0.0 - 0.1 K/uL  HCG, QUANTITATIVE, PREGNANCY     Status: Abnormal   Collection Time    07/29/14  6:36 PM      Result Value Ref Range   hCG, Beta Chain, Quant, S 36265 (*) <5 mIU/mL  WET PREP, GENITAL     Status: Abnormal   Collection Time    07/29/14  7:10 PM      Result Value Ref Range   Yeast Wet Prep HPF POC NONE SEEN  NONE SEEN   Trich, Wet Prep NONE SEEN  NONE SEEN   Clue Cells Wet Prep HPF POC NONE SEEN  NONE SEEN   WBC, Wet Prep HPF POC FEW (*) NONE SEEN  GLUCOSE, CAPILLARY     Status: None   Collection Time    07/29/14  8:48 PM      Result Value Ref Range   Glucose-Capillary 93  70 - 99 mg/dL    MDM  @ 1915 patient waiting to go to ultrasound. Care turned over to [place], CNM  Preliminary [place] report: viable IUP [redacted]w[redacted]d, HR 153  1. Abdominal pain in pregnancy   2. Viable fetus in abdominal pregnancy, first trimester, fetus 1   3. Diabetes type 2, controlled    PLAN Discharge home with pregnancy precautions, verification letter, list of providers, MPW information Meds ordered this encounter  Medications  . Prenatal Vit-DSS-Fe Cbn-FA (OBSTETRIX EC) 29-1 MG TABS    Sig: Take  1 tablet by mouth 1 day or  1 dose.    Dispense:  30 each    Refill:  0    Order Specific Question:  Supervising Provider    Answer:  CONSTANT, PEGGY [[id number]]     Medication List         acetaminophen 325 MG tablet  Commonly known as:  TYLENOL  Take 650 mg by mouth every 6 (six) hours as needed for pain or fever.     metFORMIN 500 MG tablet  Commonly known as:  GLUCOPHAGE  Take 500 mg by mouth 2 (two) times daily with a meal.     OBSTETRIX EC 29-1 MG Tabs  Take 1 tablet by mouth 1 day or 1 dose.       Follow-up Information   Follow up with [facility] [place]. (Choose pregnancy care provider from the list given)    Contact information:   [address] [place] [address] [phone]    Also sent note to [facility] as she requests appt there

## 2014-07-30 LAB — GC/CHLAMYDIA PROBE AMP
CT Probe RNA: NEGATIVE
GC PROBE AMP APTIMA: NEGATIVE

## 2014-07-30 LAB — RPR

## 2014-07-30 NOTE — MAU Provider Note (Signed)
Attestation of Attending Supervision of Advanced Practitioner (CNM/NP): Evaluation and management procedures were performed by the Advanced Practitioner under my supervision and collaboration.  I have reviewed the Advanced Practitioner's note and chart, and I agree with the management and plan.  Darly Fails 07/30/2014 1:28 AM

## 2014-08-04 ENCOUNTER — Ambulatory Visit: Payer: BC Managed Care – PPO | Admitting: Obstetrics & Gynecology

## 2014-08-04 ENCOUNTER — Encounter: Payer: BC Managed Care – PPO | Attending: Obstetrics & Gynecology | Admitting: *Deleted

## 2014-08-04 VITALS — Wt 182.0 lb

## 2014-08-04 DIAGNOSIS — O24911 Unspecified diabetes mellitus in pregnancy, first trimester: Secondary | ICD-10-CM | POA: Diagnosis present

## 2014-08-04 DIAGNOSIS — Z713 Dietary counseling and surveillance: Secondary | ICD-10-CM | POA: Insufficient documentation

## 2014-08-04 DIAGNOSIS — O24311 Unspecified pre-existing diabetes mellitus in pregnancy, first trimester: Secondary | ICD-10-CM

## 2014-08-04 NOTE — Progress Notes (Signed)
Nutrition note: DM education Pt has Type 2 DM & takes metformin. Pt has h/o obesity. Pt reports eating 3x/d. Pt is taking a PNV. Pt reports no N/V or heartburn. Pt reports she is not physically active. Pt received verbal & written education on DM diet during pregnancy. Encouraged walking most days. Discussed portion sizes. Pt agrees to follow DM diet with 3 meals & 2-3 snacks/d with proper CHO/ protein combination. Pt does not have [place] but plans to apply. Pt plans to BF. F/u in 4-6 wks Vladimir Faster, MS, RD, LDN, [facility]

## 2014-08-04 NOTE — Progress Notes (Signed)
Jasmin Ellis presents with her husband. Their primary language is [place]. [name] speaks a moderate amount of English, Her husband helps her sometimes with understanding. Patient is T2DM taking metformin 500mg  BID. She has not tested her glucose for over 3 weeks. She was seen in MAU on 07/29/14 and her glucose was 93mg /dl at approx 8:00pm. She does not know the type of glucometer she has. She will bring glucometer with her in one week for review of glucose readings. She is scheduled for MD evaluation 08/25/14. We reviewed he glucose testing protocol of FBS and 2hpp, breakfast, lunch & dinner. She will see Mickel Baas Dietitian for dietary review.

## 2014-08-05 DIAGNOSIS — O24913 Unspecified diabetes mellitus in pregnancy, third trimester: Secondary | ICD-10-CM

## 2014-08-05 DIAGNOSIS — O24919 Unspecified diabetes mellitus in pregnancy, unspecified trimester: Secondary | ICD-10-CM | POA: Diagnosis present

## 2014-08-05 HISTORY — DX: Unspecified diabetes mellitus in pregnancy, third trimester: O24.913

## 2014-08-11 ENCOUNTER — Encounter: Payer: Self-pay | Admitting: *Deleted

## 2014-08-11 ENCOUNTER — Other Ambulatory Visit: Payer: Self-pay | Admitting: Family Medicine

## 2014-08-11 ENCOUNTER — Ambulatory Visit (INDEPENDENT_AMBULATORY_CARE_PROVIDER_SITE_OTHER): Payer: BC Managed Care – PPO | Admitting: Family Medicine

## 2014-08-11 DIAGNOSIS — O24911 Unspecified diabetes mellitus in pregnancy, first trimester: Secondary | ICD-10-CM

## 2014-08-11 DIAGNOSIS — O24311 Unspecified pre-existing diabetes mellitus in pregnancy, first trimester: Secondary | ICD-10-CM

## 2014-08-11 MED ORDER — METFORMIN HCL 500 MG PO TABS: 500.0000 mg | ORAL_TABLET | ORAL | Status: DC

## 2014-08-11 MED ORDER — ONETOUCH DELICA LANCETS 33G MISC: 1.0000 [IU] | Freq: Four times a day (QID) | Status: DC

## 2014-08-11 MED ORDER — GLUCOSE BLOOD VI STRP: ORAL_STRIP | Status: DC

## 2014-08-11 NOTE — Progress Notes (Signed)
Fasting controlled.   2hr after breakfast a little elevated.  Increase metformin to 1000mg  in AM.

## 2014-08-18 ENCOUNTER — Encounter (HOSPITAL_COMMUNITY): Payer: Self-pay

## 2014-08-25 ENCOUNTER — Ambulatory Visit (INDEPENDENT_AMBULATORY_CARE_PROVIDER_SITE_OTHER): Payer: BC Managed Care – PPO | Admitting: Obstetrics & Gynecology

## 2014-08-25 ENCOUNTER — Encounter: Payer: Self-pay | Admitting: Obstetrics & Gynecology

## 2014-08-25 VITALS — BP 122/57 | HR 73 | Temp 98.2°F | Wt 179.7 lb

## 2014-08-25 DIAGNOSIS — O24911 Unspecified diabetes mellitus in pregnancy, first trimester: Secondary | ICD-10-CM

## 2014-08-25 DIAGNOSIS — O0993 Supervision of high risk pregnancy, unspecified, third trimester: Secondary | ICD-10-CM

## 2014-08-25 DIAGNOSIS — O24311 Unspecified pre-existing diabetes mellitus in pregnancy, first trimester: Secondary | ICD-10-CM

## 2014-08-25 DIAGNOSIS — Z113 Encounter for screening for infections with a predominantly sexual mode of transmission: Secondary | ICD-10-CM

## 2014-08-25 DIAGNOSIS — O2341 Unspecified infection of urinary tract in pregnancy, first trimester: Secondary | ICD-10-CM

## 2014-08-25 DIAGNOSIS — Z124 Encounter for screening for malignant neoplasm of cervix: Secondary | ICD-10-CM

## 2014-08-25 DIAGNOSIS — Z118 Encounter for screening for other infectious and parasitic diseases: Secondary | ICD-10-CM

## 2014-08-25 DIAGNOSIS — O3680X1 Pregnancy with inconclusive fetal viability, fetus 1: Secondary | ICD-10-CM

## 2014-08-25 DIAGNOSIS — B951 Streptococcus, group B, as the cause of diseases classified elsewhere: Secondary | ICD-10-CM

## 2014-08-25 LAB — POCT URINALYSIS DIP (DEVICE)
Bilirubin Urine: NEGATIVE
Glucose, UA: NEGATIVE mg/dL
Hgb urine dipstick: NEGATIVE
KETONES UR: NEGATIVE mg/dL
Leukocytes, UA: NEGATIVE
Nitrite: NEGATIVE
PH: 7 (ref 5.0–8.0)
PROTEIN: NEGATIVE mg/dL
SPECIFIC GRAVITY, URINE: 1.015 (ref 1.005–1.030)
Urobilinogen, UA: 0.2 mg/dL (ref 0.0–1.0)

## 2014-08-25 LAB — US OB COMP LESS 14 WKS

## 2014-08-25 MED ORDER — GLUCOSE BLOOD VI STRP: ORAL_STRIP | Status: DC

## 2014-08-25 MED ORDER — ONETOUCH ULTRASOFT LANCETS MISC: Status: DC

## 2014-08-25 MED ORDER — ASPIRIN EC 81 MG PO TBEC: 81.0000 mg | DELAYED_RELEASE_TABLET | Freq: Every day | ORAL | Status: DC

## 2014-08-25 NOTE — Progress Notes (Signed)
    Subjective:    Cuba Font is a 23 y.o. G2P1001 at [redacted]w[redacted]d being seen today for her first obstetrical visit.  Her obstetrical history is significant for preexisting DM. Patient does intend to breast feed. Pregnancy history fully reviewed.  Patient reports no complaints.  Filed Vitals:   08/25/14 1014  BP: 122/57  Pulse: 73  Temp: 98.2 F (36.8 C)  Weight: 179 lb 11.2 oz (81.511 kg)    HISTORY: OB History  Gravida Para Term Preterm AB SAB TAB Ectopic Multiple Living  2 1 1  0 0 0 0 0 0 1    # Outcome Date GA Lbr Len/2nd Weight Sex Delivery Anes PTL Lv  2 Current           1 Term 05/16/11 [redacted]w[redacted]d 16:14 / 03:13 7 lb 8.3 oz (3.41 kg) F Vag-Spont EPI  Y     Past Medical History  Diagnosis Date  . Back pain, chronic     s/p fall 8 years ago  . Fibroids   . Diabetes mellitus without complication    History reviewed. No pertinent past surgical history. History reviewed. No pertinent family history.   Exam    Uterus:     Pelvic Exam:    Perineum: No Hemorrhoids, Normal Perineum   Vulva: normal   Vagina:  normal mucosa, normal discharge   Cervix: multiparous appearance, no bleeding following Pap, no cervical motion tenderness and no lesions   Adnexa: normal adnexa and no mass, fullness, tenderness   Bony Pelvis: average  System: Breast:  normal appearance, no masses or tenderness   Skin: normal coloration and turgor, no rashes   Neurologic: normal   Extremities: normal strength, tone, and muscle mass   HEENT PERRLA and extra ocular movement intact   Mouth/Teeth mucous membranes moist, pharynx normal without lesions and dental hygiene good   Neck supple and no masses   Cardiovascular: regular rate and rhythm   Respiratory:  appears well, vitals normal, no respiratory distress, acyanotic, normal RR, chest clear, no wheezing, crepitations, rhonchi, normal symmetric air entry   Abdomen: soft, non-tender; bowel sounds normal; no masses,  no organomegaly   Urinary:  urethral meatus normal   Clinic ultrasound by Diane Day, RN : +FHR   Assessment:    Pregnancy: G2P1001 Patient Active Problem List   Diagnosis Date Noted  . Preexisting diabetes complicating pregnancy, antepartum 08/05/2014  . Back pain, chronic      Plan:  Discussed implications of DM in pregnancy, need for optimizing glycemic control to decrease DM associated maternal-fetal morbidity and mortality, need for antenatal testing and frequent ultrasounds/prenatal visits.  Gave physical prescription for lancets and strips; patient could not get these earlier. Will evaluate BS in one week. [x]  Initial and Baseline labs (CBC, CMP, urine Pr:Cr,TSH, HgA1C) [x]  Baby ASA prescribed after 12 weeks [x]  Fetal ECHO - scheduled [x]  Optho exam - scheduled [ ]  Serial growth scans 20-24-28-32-35-38 [ ]  Antenatal testing starting at 32 weeks [ ]  Delivery by 39 weeks or earlier if needed Problem list reviewed and updated. Continue prenatal vitamins. Genetic Screening discussed First Screen: ordered. Ultrasound discussed; fetal survey: to be ordered later. Follow up in 1 week.  Routine obstetric precautions reviewed.     Osborne Oman, MD 08/25/2014

## 2014-08-25 NOTE — Progress Notes (Signed)
Has not checked sugar in the past week-- reports she does not have lancets or strips.  Reports occasional abdominal pain.

## 2014-08-25 NOTE — Patient Instructions (Signed)
Return to clinic for any obstetric concerns or go to MAU for evaluation  

## 2014-08-25 NOTE — Progress Notes (Signed)
Nuchal Translucency with [place] 09/05/14 @ 8a.   Optho appt with Dr. Frederico Hamman at [facility] 09/16/14 @ 11a. Fetal Echo with Dr. Filbert Schilder 09/23/14 @ 1p. Rxs given for Aspirin and Glucose blood test strips.

## 2014-08-25 NOTE — Addendum Note (Signed)
Addended by: Langston Reusing on: 08/25/2014 02:43 PM   Modules accepted: Orders

## 2014-08-25 NOTE — Progress Notes (Signed)
Bedside US for viability = FHR 166 per PW doppler, FM present.

## 2014-08-26 LAB — PRENATAL PROFILE (SOLSTAS)
Antibody Screen: NEGATIVE
Basophils Absolute: 0 10*3/uL (ref 0.0–0.1)
Basophils Relative: 0 % (ref 0–1)
Eosinophils Absolute: 0.5 10*3/uL (ref 0.0–0.7)
Eosinophils Relative: 6 % — ABNORMAL HIGH (ref 0–5)
HCT: 35.7 % — ABNORMAL LOW (ref 36.0–46.0)
HEMOGLOBIN: 12 g/dL (ref 12.0–15.0)
HEP B S AG: NEGATIVE
HIV: NONREACTIVE
LYMPHS ABS: 2.3 10*3/uL (ref 0.7–4.0)
Lymphocytes Relative: 30 % (ref 12–46)
MCH: 28.3 pg (ref 26.0–34.0)
MCHC: 33.6 g/dL (ref 30.0–36.0)
MCV: 84.2 fL (ref 78.0–100.0)
MONOS PCT: 5 % (ref 3–12)
Monocytes Absolute: 0.4 10*3/uL (ref 0.1–1.0)
NEUTROS ABS: 4.6 10*3/uL (ref 1.7–7.7)
NEUTROS PCT: 59 % (ref 43–77)
PLATELETS: 302 10*3/uL (ref 150–400)
RBC: 4.24 MIL/uL (ref 3.87–5.11)
RDW: 14 % (ref 11.5–15.5)
RUBELLA: 20.2 {index} — AB (ref <0.90)
Rh Type: POSITIVE
WBC: 7.8 10*3/uL (ref 4.0–10.5)

## 2014-08-26 LAB — COMPREHENSIVE METABOLIC PANEL
ALT: 11 U/L (ref 0–35)
AST: 13 U/L (ref 0–37)
Albumin: 4 g/dL (ref 3.5–5.2)
Alkaline Phosphatase: 38 U/L — ABNORMAL LOW (ref 39–117)
BILIRUBIN TOTAL: 0.2 mg/dL (ref 0.2–1.2)
BUN: 6 mg/dL (ref 6–23)
CO2: 24 meq/L (ref 19–32)
CREATININE: 0.4 mg/dL — AB (ref 0.50–1.10)
Calcium: 10.5 mg/dL (ref 8.4–10.5)
Chloride: 102 mEq/L (ref 96–112)
GLUCOSE: 95 mg/dL (ref 70–99)
Potassium: 4.3 mEq/L (ref 3.5–5.3)
Sodium: 136 mEq/L (ref 135–145)
Total Protein: 6.8 g/dL (ref 6.0–8.3)

## 2014-08-26 LAB — PROTEIN / CREATININE RATIO, URINE
CREATININE, URINE: 27.5 mg/dL
Total Protein, Urine: <4 mg/dL — ABNORMAL LOW (ref 5–24)

## 2014-08-26 LAB — CYTOLOGY - PAP

## 2014-08-26 LAB — HEMOGLOBIN A1C
HEMOGLOBIN A1C: 5.7 % — AB (ref <5.7)
Mean Plasma Glucose: 117 mg/dL — ABNORMAL HIGH (ref <117)

## 2014-08-26 LAB — TSH: TSH: 0.773 u[IU]/mL (ref 0.350–4.500)

## 2014-08-27 LAB — PRESCRIPTION MONITORING PROFILE (19 PANEL)
AMPHETAMINE/METH: NEGATIVE ng/mL
Barbiturate Screen, Urine: NEGATIVE ng/mL
Benzodiazepine Screen, Urine: NEGATIVE ng/mL
Buprenorphine, Urine: NEGATIVE ng/mL
CARISOPRODOL, URINE: NEGATIVE ng/mL
Cannabinoid Scrn, Ur: NEGATIVE ng/mL
Cocaine Metabolites: NEGATIVE ng/mL
Creatinine, Urine: 27.58 mg/dL (ref >20.0)
ECSTASY: NEGATIVE ng/mL
Fentanyl, Ur: NEGATIVE ng/mL
MEPERIDINE UR: NEGATIVE ng/mL
METHADONE SCREEN, URINE: NEGATIVE ng/mL
Methaqualone: NEGATIVE ng/mL
Nitrites, Initial: NEGATIVE ug/mL
Opiate Screen, Urine: NEGATIVE ng/mL
Oxycodone Screen, Ur: NEGATIVE ng/mL
PH URINE, INITIAL: 7.1 pH (ref 4.5–8.9)
Phencyclidine, Ur: NEGATIVE ng/mL
Propoxyphene: NEGATIVE ng/mL
TAPENTADOLUR: NEGATIVE ng/mL
Tramadol Scrn, Ur: NEGATIVE ng/mL
Zolpidem, Urine: NEGATIVE ng/mL

## 2014-08-27 LAB — HEMOGLOBINOPATHY EVALUATION
HGB A2 QUANT: 2.9 % (ref 2.2–3.2)
Hemoglobin Other: 0 %
Hgb A: 97.1 % (ref 96.8–97.8)
Hgb F Quant: 0 % (ref 0.0–2.0)
Hgb S Quant: 0 %

## 2014-08-28 LAB — CULTURE, OB URINE

## 2014-08-29 ENCOUNTER — Encounter: Payer: Self-pay | Admitting: Obstetrics & Gynecology

## 2014-08-29 DIAGNOSIS — O2340 Unspecified infection of urinary tract in pregnancy, unspecified trimester: Secondary | ICD-10-CM

## 2014-08-29 DIAGNOSIS — B951 Streptococcus, group B, as the cause of diseases classified elsewhere: Secondary | ICD-10-CM | POA: Insufficient documentation

## 2014-08-29 LAB — OB RESULTS CONSOLE GBS: STREP GROUP B AG: POSITIVE

## 2014-08-29 MED ORDER — CEFADROXIL 500 MG PO CAPS: 500.0000 mg | ORAL_CAPSULE | Freq: Two times a day (BID) | ORAL | Status: DC

## 2014-08-29 NOTE — Addendum Note (Signed)
Addended by: Verita Schneiders A on: 08/29/2014 12:27 PM   Modules accepted: Orders

## 2014-09-01 ENCOUNTER — Telehealth: Payer: Self-pay

## 2014-09-01 ENCOUNTER — Telehealth: Payer: Self-pay | Admitting: *Deleted

## 2014-09-01 ENCOUNTER — Encounter: Payer: BC Managed Care – PPO | Admitting: Obstetrics & Gynecology

## 2014-09-01 DIAGNOSIS — B951 Streptococcus, group B, as the cause of diseases classified elsewhere: Secondary | ICD-10-CM

## 2014-09-01 DIAGNOSIS — O2341 Unspecified infection of urinary tract in pregnancy, first trimester: Secondary | ICD-10-CM

## 2014-09-01 LAB — OB RESULTS CONSOLE GBS: GBS: POSITIVE

## 2014-09-01 MED ORDER — AMOXICILLIN 500 MG PO CAPS: 500.0000 mg | ORAL_CAPSULE | Freq: Three times a day (TID) | ORAL | Status: DC

## 2014-09-01 NOTE — Telephone Encounter (Signed)
Attempted to contact patient. Husband answered and stated she had just gone to work. Took message to inform patient of UTI and RX at pharmacy. Advised that if medication is too expensive to call clinic and we can change RX. Verbalized understanding. No questions or concerns.

## 2014-09-01 NOTE — Telephone Encounter (Signed)
-----   Message from Osborne Oman, MD sent at 08/29/2014 12:30 PM EST ----- Positive GBS UTI. Cefadroxil  500 mg po bid x 7 days ordered. If this antibiotics is not covered or too expensive as per her insurance, can switch to Amoxicillin 500 mg po tid x 7 days.  Please call to inform patient of results and advise her to pick up prescription.

## 2014-09-01 NOTE — Telephone Encounter (Signed)
Patient was unable to get the med sent in by Dr. Harolyn Rutherford. Per previous note will send in Amoxicillin 500mg  tid x7d. Patients husband is aware and will pick up rx.

## 2014-09-05 ENCOUNTER — Ambulatory Visit (HOSPITAL_COMMUNITY)
Admission: RE | Admit: 2014-09-05 | Discharge: 2014-09-05 | Disposition: A | Discharge status: Home / Self Care | Payer: BC Managed Care – PPO | Source: Ambulatory Visit | Attending: Obstetrics and Gynecology | Admitting: Obstetrics and Gynecology

## 2014-09-05 ENCOUNTER — Encounter (HOSPITAL_COMMUNITY): Payer: Self-pay

## 2014-09-05 ENCOUNTER — Ambulatory Visit (HOSPITAL_COMMUNITY)
Admission: RE | Admit: 2014-09-05 | Discharge: 2014-09-05 | Disposition: A | Discharge status: Home / Self Care | Payer: BC Managed Care – PPO | Source: Ambulatory Visit | Attending: Obstetrics & Gynecology | Admitting: Obstetrics & Gynecology

## 2014-09-05 DIAGNOSIS — O24111 Pre-existing diabetes mellitus, type 2, in pregnancy, first trimester: Secondary | ICD-10-CM | POA: Insufficient documentation

## 2014-09-05 DIAGNOSIS — Z36 Encounter for antenatal screening of mother: Secondary | ICD-10-CM | POA: Diagnosis not present

## 2014-09-05 DIAGNOSIS — Z3A12 12 weeks gestation of pregnancy: Secondary | ICD-10-CM | POA: Diagnosis not present

## 2014-09-05 DIAGNOSIS — E119 Type 2 diabetes mellitus without complications: Secondary | ICD-10-CM | POA: Insufficient documentation

## 2014-09-05 DIAGNOSIS — Z3682 Encounter for antenatal screening for nuchal translucency: Secondary | ICD-10-CM | POA: Insufficient documentation

## 2014-09-05 DIAGNOSIS — O24311 Unspecified pre-existing diabetes mellitus in pregnancy, first trimester: Secondary | ICD-10-CM

## 2014-09-08 ENCOUNTER — Ambulatory Visit (INDEPENDENT_AMBULATORY_CARE_PROVIDER_SITE_OTHER): Payer: BC Managed Care – PPO | Admitting: Obstetrics and Gynecology

## 2014-09-08 VITALS — BP 136/59 | HR 76 | Temp 98.1°F | Wt 181.7 lb

## 2014-09-08 DIAGNOSIS — O24911 Unspecified diabetes mellitus in pregnancy, first trimester: Secondary | ICD-10-CM

## 2014-09-08 DIAGNOSIS — O24311 Unspecified pre-existing diabetes mellitus in pregnancy, first trimester: Secondary | ICD-10-CM

## 2014-09-08 LAB — POCT URINALYSIS DIP (DEVICE)
Bilirubin Urine: NEGATIVE
Glucose, UA: NEGATIVE mg/dL
Hgb urine dipstick: NEGATIVE
KETONES UR: NEGATIVE mg/dL
LEUKOCYTES UA: NEGATIVE
NITRITE: NEGATIVE
PH: 7 (ref 5.0–8.0)
Protein, ur: NEGATIVE mg/dL
Specific Gravity, Urine: 1.01 (ref 1.005–1.030)
Urobilinogen, UA: 0.2 mg/dL (ref 0.0–1.0)

## 2014-09-08 NOTE — Progress Notes (Signed)
Reviewed patient glucose readings: FBS all 90mg /dl and 2hpp <120. Patient noted that she had checked with [facility] and that she did not have any refills on supplies. I contacted pharmacy and they stated she did have 11 refills and her co/pay is $121.21. This will last 25 days. Patient struggled with this cost.EDD: 03/15/15 I made her aware that another option was to use [facility] [employer] meter, strips and lancets and it would be less costly.

## 2014-09-08 NOTE — Progress Notes (Signed)
Reviewed patient glucose readings: FBS all 90mg /dl and 2hpp <120 - taking Metformin as prescribed - Pt has not started baby aspirin, pt feels like she had an allergy to baby aspirin in [place]  Last day of amoxicillin for infection  ? FM, No VB, no LOF, no contractions + FHR on doppler  F/up 1 mo.

## 2014-09-08 NOTE — Progress Notes (Signed)
Pt has not started baby aspirin, pt feels like she had an allergy to baby aspirin in [place]

## 2014-09-22 ENCOUNTER — Ambulatory Visit (INDEPENDENT_AMBULATORY_CARE_PROVIDER_SITE_OTHER): Payer: BC Managed Care – PPO | Admitting: Obstetrics & Gynecology

## 2014-09-22 VITALS — BP 142/68 | HR 82 | Temp 98.0°F | Wt 179.4 lb

## 2014-09-22 DIAGNOSIS — O09892 Supervision of other high risk pregnancies, second trimester: Secondary | ICD-10-CM

## 2014-09-22 DIAGNOSIS — O0992 Supervision of high risk pregnancy, unspecified, second trimester: Secondary | ICD-10-CM

## 2014-09-22 LAB — POCT URINALYSIS DIP (DEVICE)
Bilirubin Urine: NEGATIVE
Glucose, UA: NEGATIVE mg/dL
Hgb urine dipstick: NEGATIVE
KETONES UR: NEGATIVE mg/dL
Leukocytes, UA: NEGATIVE
NITRITE: NEGATIVE
PH: 6.5 (ref 5.0–8.0)
PROTEIN: NEGATIVE mg/dL
Specific Gravity, Urine: 1.02 (ref 1.005–1.030)
Urobilinogen, UA: 1 mg/dL (ref 0.0–1.0)

## 2014-09-22 NOTE — Progress Notes (Signed)
Fastings 90 and below; 2 hr break 95-110; 2 hr lunch 90-128; 2 hr dinner 117-135  Continue metformin.  May need to add meds to control dinner CBGs AFP today.  First screen done but results not back yet. Anatomy US ordered. BP is mildly elevated  Will watch closely.  Baseline protein is nml.

## 2014-09-22 NOTE — Progress Notes (Signed)
Anatomy U/S with Radiology 10/20/14 @ 1030a. Pt reminded of fetal echo appt with Dr. Filbert Schilder 09/23/14 @ 1p.

## 2014-09-23 LAB — ALPHA FETOPROTEIN, MATERNAL
AFP: 14.7 ng/mL
Curr Gest Age: 15.1 wks.days
MoM for AFP: 0.72
OPEN SPINA BIFIDA: NEGATIVE
Osb Risk: <1:7780 {titer}

## 2014-09-24 ENCOUNTER — Other Ambulatory Visit (HOSPITAL_COMMUNITY): Payer: Self-pay

## 2014-10-13 ENCOUNTER — Encounter: Payer: Self-pay | Admitting: *Deleted

## 2014-10-13 ENCOUNTER — Encounter: Payer: Self-pay | Admitting: Obstetrics and Gynecology

## 2014-10-13 ENCOUNTER — Ambulatory Visit (INDEPENDENT_AMBULATORY_CARE_PROVIDER_SITE_OTHER): Payer: BC Managed Care – PPO | Admitting: Obstetrics and Gynecology

## 2014-10-13 VITALS — BP 126/67 | HR 88 | Temp 98.0°F | Wt 184.1 lb

## 2014-10-13 DIAGNOSIS — O24912 Unspecified diabetes mellitus in pregnancy, second trimester: Secondary | ICD-10-CM

## 2014-10-13 DIAGNOSIS — B951 Streptococcus, group B, as the cause of diseases classified elsewhere: Secondary | ICD-10-CM

## 2014-10-13 DIAGNOSIS — O2342 Unspecified infection of urinary tract in pregnancy, second trimester: Secondary | ICD-10-CM

## 2014-10-13 DIAGNOSIS — O0992 Supervision of high risk pregnancy, unspecified, second trimester: Secondary | ICD-10-CM

## 2014-10-13 DIAGNOSIS — O24312 Unspecified pre-existing diabetes mellitus in pregnancy, second trimester: Secondary | ICD-10-CM

## 2014-10-13 LAB — POCT URINALYSIS DIP (DEVICE)
BILIRUBIN URINE: NEGATIVE
Glucose, UA: NEGATIVE mg/dL
HGB URINE DIPSTICK: NEGATIVE
Ketones, ur: NEGATIVE mg/dL
Leukocytes, UA: NEGATIVE
Nitrite: NEGATIVE
Protein, ur: NEGATIVE mg/dL
Specific Gravity, Urine: 1.01 (ref 1.005–1.030)
Urobilinogen, UA: 1 mg/dL (ref 0.0–1.0)
pH: 7 (ref 5.0–8.0)

## 2014-10-13 NOTE — Progress Notes (Signed)
Patient is doing well without complaints. Results of AFP reviewed with patient. CBGs reviewed and all within range. Will continue current metformin regimen. Anatomy ultrasound scheduled for 10/20/2013 Patient requesting dermatology referral for left hand ring finger deformity (appears like a fungal infection)

## 2014-10-13 NOTE — Progress Notes (Signed)
Pt declined dermatology appt with [facility] due to concerns with transportation.  Will continue to check for available offices accepting medicaid patients.

## 2014-10-13 NOTE — Progress Notes (Signed)
Pt reports breast pain.

## 2014-10-14 ENCOUNTER — Encounter: Payer: Self-pay | Admitting: Obstetrics & Gynecology

## 2014-10-16 NOTE — Progress Notes (Signed)
Pt notified of appt with [facility] 11/19/14 @ 1130a. with Dr. Harriett Sine.  Pt has [place].

## 2014-10-17 NOTE — L&D Delivery Note (Cosign Needed)
Delivery Note At 4:17 PM a viable female was delivered via Vaginal, Spontaneous Delivery (Presentation: ROA).  APGAR: 9, 9; weight  pending.   Placenta status: Intact, Spontaneous.  Cord: 3 vessels with the following complications: None.  Cord pH: n/a  Anesthesia: Epidural  Episiotomy: None Lacerations: 2nd degree Suture Repair: 2.0 vicryl rapide Est. Blood Loss (mL):  150  Mom to postpartum.  Baby to Couplet care / Skin to Skin.  [name] 03/09/2015, 4:51 PM

## 2014-10-20 ENCOUNTER — Ambulatory Visit (HOSPITAL_COMMUNITY)
Admission: RE | Admit: 2014-10-20 | Discharge: 2014-10-20 | Disposition: A | Discharge status: Home / Self Care | Payer: 59 | Source: Ambulatory Visit | Attending: Obstetrics & Gynecology | Admitting: Obstetrics & Gynecology

## 2014-10-20 DIAGNOSIS — D259 Leiomyoma of uterus, unspecified: Secondary | ICD-10-CM | POA: Diagnosis not present

## 2014-10-20 DIAGNOSIS — O3412 Maternal care for benign tumor of corpus uteri, second trimester: Secondary | ICD-10-CM | POA: Diagnosis not present

## 2014-10-20 DIAGNOSIS — O24112 Pre-existing diabetes mellitus, type 2, in pregnancy, second trimester: Secondary | ICD-10-CM | POA: Diagnosis not present

## 2014-10-20 DIAGNOSIS — E119 Type 2 diabetes mellitus without complications: Secondary | ICD-10-CM | POA: Insufficient documentation

## 2014-10-20 DIAGNOSIS — Z3A19 19 weeks gestation of pregnancy: Secondary | ICD-10-CM | POA: Insufficient documentation

## 2014-10-20 DIAGNOSIS — Z36 Encounter for antenatal screening of mother: Secondary | ICD-10-CM | POA: Insufficient documentation

## 2014-10-20 DIAGNOSIS — O09892 Supervision of other high risk pregnancies, second trimester: Secondary | ICD-10-CM

## 2014-10-20 DIAGNOSIS — O0992 Supervision of high risk pregnancy, unspecified, second trimester: Secondary | ICD-10-CM

## 2014-11-03 ENCOUNTER — Ambulatory Visit (INDEPENDENT_AMBULATORY_CARE_PROVIDER_SITE_OTHER): Payer: 59 | Admitting: Obstetrics and Gynecology

## 2014-11-03 ENCOUNTER — Encounter: Payer: Self-pay | Admitting: Obstetrics and Gynecology

## 2014-11-03 VITALS — BP 130/58 | HR 84 | Temp 97.5°F | Wt 187.3 lb

## 2014-11-03 DIAGNOSIS — O0992 Supervision of high risk pregnancy, unspecified, second trimester: Secondary | ICD-10-CM

## 2014-11-03 DIAGNOSIS — O24912 Unspecified diabetes mellitus in pregnancy, second trimester: Secondary | ICD-10-CM

## 2014-11-03 DIAGNOSIS — O24312 Unspecified pre-existing diabetes mellitus in pregnancy, second trimester: Secondary | ICD-10-CM

## 2014-11-03 LAB — POCT URINALYSIS DIP (DEVICE)
BILIRUBIN URINE: NEGATIVE
Glucose, UA: NEGATIVE mg/dL
HGB URINE DIPSTICK: NEGATIVE
KETONES UR: NEGATIVE mg/dL
NITRITE: NEGATIVE
PH: 7 (ref 5.0–8.0)
Protein, ur: NEGATIVE mg/dL
Specific Gravity, Urine: 1.01 (ref 1.005–1.030)
Urobilinogen, UA: 0.2 mg/dL (ref 0.0–1.0)

## 2014-11-03 NOTE — Progress Notes (Signed)
Patient is doing well without complaints. She reports having her fetal echo done on 12/8- will locate the results. Patient has not been checking her CBGs. She reports fasting this am 90. She is taking her metformin and reports adhering to the diet. Importance of bringing log book discussed in order to optimize prenatal care and to avoid adverse outcomes such as fetal death discussed. Patient verbalized understanding.

## 2014-11-24 ENCOUNTER — Ambulatory Visit (INDEPENDENT_AMBULATORY_CARE_PROVIDER_SITE_OTHER): Payer: 59 | Admitting: Obstetrics & Gynecology

## 2014-11-24 VITALS — BP 118/61 | HR 84 | Temp 97.1°F | Wt 190.4 lb

## 2014-11-24 DIAGNOSIS — O24913 Unspecified diabetes mellitus in pregnancy, third trimester: Secondary | ICD-10-CM

## 2014-11-24 DIAGNOSIS — O24313 Unspecified pre-existing diabetes mellitus in pregnancy, third trimester: Secondary | ICD-10-CM

## 2014-11-24 LAB — POCT URINALYSIS DIP (DEVICE)
Bilirubin Urine: NEGATIVE
Glucose, UA: 100 mg/dL — AB
HGB URINE DIPSTICK: NEGATIVE
Ketones, ur: NEGATIVE mg/dL
Nitrite: NEGATIVE
PROTEIN: NEGATIVE mg/dL
SPECIFIC GRAVITY, URINE: 1.01 (ref 1.005–1.030)
Urobilinogen, UA: 0.2 mg/dL (ref 0.0–1.0)
pH: 7 (ref 5.0–8.0)

## 2014-11-24 NOTE — Patient Instructions (Signed)
Return to clinic for any obstetric concerns or go to MAU for evaluation  

## 2014-11-24 NOTE — Progress Notes (Signed)
Follow-up U/S 11/25/14 @ 4p with Radiology.

## 2014-11-24 NOTE — Progress Notes (Signed)
Blood sugars within range, continue diet and Metformin Growth scan ordered. No other complaints or concerns.  Routine obstetric precautions reviewed.

## 2014-11-25 ENCOUNTER — Ambulatory Visit (HOSPITAL_COMMUNITY)
Admission: RE | Admit: 2014-11-25 | Discharge: 2014-11-25 | Disposition: A | Discharge status: Home / Self Care | Payer: 59 | Source: Ambulatory Visit | Attending: Obstetrics & Gynecology | Admitting: Obstetrics & Gynecology

## 2014-11-25 DIAGNOSIS — O24112 Pre-existing diabetes mellitus, type 2, in pregnancy, second trimester: Secondary | ICD-10-CM | POA: Diagnosis present

## 2014-11-25 DIAGNOSIS — Z36 Encounter for antenatal screening of mother: Secondary | ICD-10-CM | POA: Diagnosis not present

## 2014-11-25 DIAGNOSIS — Z3A24 24 weeks gestation of pregnancy: Secondary | ICD-10-CM | POA: Diagnosis not present

## 2014-11-25 DIAGNOSIS — D259 Leiomyoma of uterus, unspecified: Secondary | ICD-10-CM | POA: Insufficient documentation

## 2014-11-25 DIAGNOSIS — E119 Type 2 diabetes mellitus without complications: Secondary | ICD-10-CM | POA: Diagnosis not present

## 2014-11-25 DIAGNOSIS — O24313 Unspecified pre-existing diabetes mellitus in pregnancy, third trimester: Secondary | ICD-10-CM

## 2014-11-25 DIAGNOSIS — O3412 Maternal care for benign tumor of corpus uteri, second trimester: Secondary | ICD-10-CM | POA: Insufficient documentation

## 2014-11-26 ENCOUNTER — Encounter (HOSPITAL_COMMUNITY): Payer: Self-pay | Admitting: *Deleted

## 2014-11-26 ENCOUNTER — Inpatient Hospital Stay (HOSPITAL_COMMUNITY)
Admission: AD | Admit: 2014-11-26 | Discharge: 2014-11-26 | Disposition: A | Discharge status: Home / Self Care | Payer: 59 | Source: Ambulatory Visit | Attending: Obstetrics & Gynecology | Admitting: Obstetrics & Gynecology

## 2014-11-26 DIAGNOSIS — O98812 Other maternal infectious and parasitic diseases complicating pregnancy, second trimester: Secondary | ICD-10-CM | POA: Insufficient documentation

## 2014-11-26 DIAGNOSIS — O24112 Pre-existing diabetes mellitus, type 2, in pregnancy, second trimester: Secondary | ICD-10-CM | POA: Diagnosis not present

## 2014-11-26 DIAGNOSIS — E119 Type 2 diabetes mellitus without complications: Secondary | ICD-10-CM | POA: Insufficient documentation

## 2014-11-26 DIAGNOSIS — B373 Candidiasis of vulva and vagina: Secondary | ICD-10-CM | POA: Diagnosis not present

## 2014-11-26 DIAGNOSIS — O23592 Infection of other part of genital tract in pregnancy, second trimester: Secondary | ICD-10-CM | POA: Diagnosis not present

## 2014-11-26 DIAGNOSIS — O24312 Unspecified pre-existing diabetes mellitus in pregnancy, second trimester: Secondary | ICD-10-CM

## 2014-11-26 DIAGNOSIS — B3731 Acute candidiasis of vulva and vagina: Secondary | ICD-10-CM

## 2014-11-26 DIAGNOSIS — Z3A24 24 weeks gestation of pregnancy: Secondary | ICD-10-CM

## 2014-11-26 DIAGNOSIS — O98811 Other maternal infectious and parasitic diseases complicating pregnancy, first trimester: Secondary | ICD-10-CM

## 2014-11-26 DIAGNOSIS — L293 Anogenital pruritus, unspecified: Secondary | ICD-10-CM | POA: Diagnosis present

## 2014-11-26 LAB — URINALYSIS, ROUTINE W REFLEX MICROSCOPIC
BILIRUBIN URINE: NEGATIVE
Glucose, UA: NEGATIVE mg/dL
Hgb urine dipstick: NEGATIVE
Ketones, ur: NEGATIVE mg/dL
NITRITE: NEGATIVE
Protein, ur: NEGATIVE mg/dL
SPECIFIC GRAVITY, URINE: 1.015 (ref 1.005–1.030)
UROBILINOGEN UA: 0.2 mg/dL (ref 0.0–1.0)
pH: 7.5 (ref 5.0–8.0)

## 2014-11-26 LAB — WET PREP, GENITAL
Clue Cells Wet Prep HPF POC: NONE SEEN
Trich, Wet Prep: NONE SEEN

## 2014-11-26 LAB — URINE MICROSCOPIC-ADD ON

## 2014-11-26 MED ORDER — TERCONAZOLE 0.4 % VA CREA
1.0000 | TOPICAL_CREAM | Freq: Every day | VAGINAL | Status: DC
Start: 2014-11-26 — End: 2014-12-08

## 2014-11-26 NOTE — MAU Note (Signed)
Pain with urination started 5 days ago, vaginal itching also for 5 days.  Denies uc's, LOF, & bleeding.  Has white discharge.  Reports good FM.

## 2014-11-26 NOTE — MAU Provider Note (Signed)
CSN: [id number]     Arrival date & time 11/26/14  1712 History   None    Chief Complaint  Patient presents with  . Dysuria  . Vaginal Itching     (Consider location/radiation/quality/duration/timing/severity/associated sxs/prior Treatment) HPI Jasmin Ellis is a 24 y.o. G2P1001 @ 106w3d gestation who presents to MAU with vaginal itching and discharge. She is a diabetic. She denies any other problems.  Past Medical History  Diagnosis Date  . Back pain, chronic     s/p fall 8 years ago  . Fibroids   . Diabetes mellitus without complication    History reviewed. No pertinent past surgical history. History reviewed. No pertinent family history. History  Substance Use Topics  . Smoking status: Never Smoker   . Smokeless tobacco: Never Used  . Alcohol Use: No   OB History    Gravida Para Term Preterm AB TAB SAB Ectopic Multiple Living   2 1 1  0 0 0 0 0 0 1     Review of Systems Negative except as stated in HPI   Allergies  Review of patient's allergies indicates no known allergies.  Home Medications   Prior to Admission medications   Medication Sig Start Date End Date Taking? Authorizing Provider  acetaminophen (TYLENOL) 325 MG tablet Take 650 mg by mouth every 6 (six) hours as needed for pain or fever.   Yes Historical Provider, MD  aspirin EC 81 MG tablet Take 1 tablet (81 mg total) by mouth daily. Starting at 12 weeks to prevent preeclamspia 08/25/14  Yes Ugonna A Anyanwu, MD  metFORMIN (GLUCOPHAGE) 500 MG tablet Take 1 tablet (500 mg total) by mouth See admin instructions. 1000mg  in AM and 500mg  in PM Patient taking differently: Take 500 mg by mouth 2 (two) times daily. 500mg  twice daily. 08/11/14  Yes Truett Mainland, DO  Prenatal Vit-DSS-Fe Cbn-FA (OBSTETRIX EC) 29-1 MG TABS Take 1 tablet by mouth 1 day or 1 dose. 07/29/14  Yes Deirdre C Poe, CNM  glucose blood (ONE TOUCH ULTRA TEST) test strip Use as instructed 08/25/14   Osborne Oman, MD  glucose blood test  strip Use as instructed 08/11/14   Truett Mainland, DO  Lancets [facility] ULTRASOFT) lancets Use as instructed 08/25/14   Osborne Oman, MD  [facility] DELICA LANCETS 34H MISC 1 Units by Does not apply route 4 (four) times daily. 08/11/14   Truett Mainland, DO  terconazole (TERAZOL 7) 0.4 % vaginal cream Place 1 applicator vaginally at bedtime. 11/26/14   Mandrell Vangilder Bunnie Pion, NP   BP 133/59 mmHg  Pulse 85  Temp(Src) 97.6 F (36.4 C) (Oral)  Resp 18  LMP 05/29/2014 Physical Exam  Constitutional: She is oriented to person, place, and time. She appears well-developed and well-nourished.  HENT:  Head: Normocephalic.  Eyes: EOM are normal.  Neck: Neck supple.  Cardiovascular: Normal rate.   Pulmonary/Chest: Effort normal.  Abdominal: Soft. There is no tenderness.  Gravid consistent with dates.   Genitourinary:  External genitalia without lesions. Thick white discharge vaginal vault. Cervix closed, thick, high.   Musculoskeletal: Normal range of motion.  Neurological: She is alert and oriented to person, place, and time. No cranial nerve deficit.  Skin: Skin is warm and dry.  Psychiatric: She has a normal mood and affect. Her behavior is normal.  Nursing note and vitals reviewed.   ED Course  Procedures (including critical care time) Results for orders placed or performed during the hospital encounter of 11/26/14 (from the  past 24 hour(s))  Urinalysis, Routine w reflex microscopic     Status: Abnormal   Collection Time: 11/26/14  6:54 PM  Result Value Ref Range   Color, Urine YELLOW YELLOW   APPearance CLEAR CLEAR   Specific Gravity, Urine 1.015 1.005 - 1.030   pH 7.5 5.0 - 8.0   Glucose, UA NEGATIVE NEGATIVE mg/dL   Hgb urine dipstick NEGATIVE NEGATIVE   Bilirubin Urine NEGATIVE NEGATIVE   Ketones, ur NEGATIVE NEGATIVE mg/dL   Protein, ur NEGATIVE NEGATIVE mg/dL   Urobilinogen, UA 0.2 0.0 - 1.0 mg/dL   Nitrite NEGATIVE NEGATIVE   Leukocytes, UA LARGE (A) NEGATIVE  Urine  microscopic-add on     Status: None   Collection Time: 11/26/14  6:54 PM  Result Value Ref Range   Squamous Epithelial / LPF RARE RARE   WBC, UA 3-6 <3 WBC/hpf   RBC / HPF 0-2 <3 RBC/hpf   Bacteria, UA RARE RARE  Wet prep, genital     Status: Abnormal   Collection Time: 11/26/14  7:20 PM  Result Value Ref Range   Yeast Wet Prep HPF POC MANY (A) NONE SEEN   Trich, Wet Prep NONE SEEN NONE SEEN   Clue Cells Wet Prep HPF POC NONE SEEN NONE SEEN   WBC, Wet Prep HPF POC MANY (A) NONE SEEN    Imaging Review US Ob Follow Up  11/25/2014   OBSTETRICAL ULTRASOUND: This exam was performed within a [facility]. The OB US report was generated in the AS system, and faxed to the ordering physician.   This report is available in the [facility]. See the [facility] Obstetric US report via the Image Link.    MDM  24 y.o. female @ [redacted]w[redacted]d gestation with DM, vaginal itching and discharge. Will treat for yeast infection. Stable for d/c with negative glucose in urine, no contractions and no UTI symptoms. Discussed with the patient and all questioned fully answered. She will return if any problems arise.  Final diagnoses:  Monilial vaginitis

## 2014-11-27 ENCOUNTER — Ambulatory Visit (HOSPITAL_COMMUNITY): Payer: 59

## 2014-11-27 LAB — GC/CHLAMYDIA PROBE AMP (~~LOC~~) NOT AT ARMC
Chlamydia: NEGATIVE
NEISSERIA GONORRHEA: NEGATIVE

## 2014-11-28 LAB — RPR: RPR: NONREACTIVE

## 2014-12-08 ENCOUNTER — Ambulatory Visit (INDEPENDENT_AMBULATORY_CARE_PROVIDER_SITE_OTHER): Payer: 59 | Admitting: Obstetrics and Gynecology

## 2014-12-08 VITALS — BP 127/56 | HR 93 | Temp 97.3°F

## 2014-12-08 DIAGNOSIS — O24312 Unspecified pre-existing diabetes mellitus in pregnancy, second trimester: Secondary | ICD-10-CM

## 2014-12-08 DIAGNOSIS — O0992 Supervision of high risk pregnancy, unspecified, second trimester: Secondary | ICD-10-CM

## 2014-12-08 DIAGNOSIS — Z23 Encounter for immunization: Secondary | ICD-10-CM

## 2014-12-08 DIAGNOSIS — O24912 Unspecified diabetes mellitus in pregnancy, second trimester: Secondary | ICD-10-CM

## 2014-12-08 LAB — CBC
HEMATOCRIT: 33.4 % — AB (ref 36.0–46.0)
Hemoglobin: 11.1 g/dL — ABNORMAL LOW (ref 12.0–15.0)
MCH: 28.6 pg (ref 26.0–34.0)
MCHC: 33.2 g/dL (ref 30.0–36.0)
MCV: 86.1 fL (ref 78.0–100.0)
MPV: 10.6 fL (ref 8.6–12.4)
Platelets: 246 10*3/uL (ref 150–400)
RBC: 3.88 MIL/uL (ref 3.87–5.11)
RDW: 13.8 % (ref 11.5–15.5)
WBC: 8.5 10*3/uL (ref 4.0–10.5)

## 2014-12-08 LAB — POCT URINALYSIS DIP (DEVICE)
Bilirubin Urine: NEGATIVE
Glucose, UA: 500 mg/dL — AB
Hgb urine dipstick: NEGATIVE
Ketones, ur: NEGATIVE mg/dL
Nitrite: NEGATIVE
PROTEIN: NEGATIVE mg/dL
Specific Gravity, Urine: 1.01 (ref 1.005–1.030)
Urobilinogen, UA: 0.2 mg/dL (ref 0.0–1.0)
pH: 7 (ref 5.0–8.0)

## 2014-12-08 MED ORDER — TETANUS-DIPHTH-ACELL PERTUSSIS 5-2.5-18.5 LF-MCG/0.5 IM SUSP
0.5000 mL | Freq: Once | INTRAMUSCULAR | Status: AC
  Administered 2014-12-08: 0.5 mL via INTRAMUSCULAR

## 2014-12-08 NOTE — Progress Notes (Signed)
G2P1001 at [redacted]w[redacted]d. Doing well today. No concerns or complaints. +FM. No LOF, VB, uterine contractions. 1. Type II DM. Currently taking metformin 500 mg BID. Reviewed blood glucose log and all at goal. Continue diet/exercise. Follow up growth ultrasound ordered for 28 weeks.  2. Routine PNC. 28 week labs, Tdap today. FM/PTL precautions reviewed.

## 2014-12-08 NOTE — Addendum Note (Signed)
Addended by: Christiana Pellant A on: 12/08/2014 10:20 AM   Modules accepted: Orders

## 2014-12-08 NOTE — Progress Notes (Signed)
Growth U/S 12/23/14 @ 10a with [facility] (no appts avail 12/22/14)

## 2014-12-08 NOTE — Progress Notes (Signed)
CBC and HIV today. RPR drawn recently 11/26/14.  Tdap today.

## 2014-12-09 LAB — HIV ANTIBODY (ROUTINE TESTING W REFLEX): HIV: NONREACTIVE

## 2014-12-18 ENCOUNTER — Encounter: Payer: Self-pay | Admitting: Obstetrics & Gynecology

## 2014-12-22 ENCOUNTER — Ambulatory Visit (INDEPENDENT_AMBULATORY_CARE_PROVIDER_SITE_OTHER): Payer: 59 | Admitting: Obstetrics and Gynecology

## 2014-12-22 VITALS — BP 127/66 | HR 92 | Temp 98.0°F | Wt 193.0 lb

## 2014-12-22 DIAGNOSIS — O24313 Unspecified pre-existing diabetes mellitus in pregnancy, third trimester: Secondary | ICD-10-CM

## 2014-12-22 DIAGNOSIS — O24913 Unspecified diabetes mellitus in pregnancy, third trimester: Secondary | ICD-10-CM

## 2014-12-22 DIAGNOSIS — O0993 Supervision of high risk pregnancy, unspecified, third trimester: Secondary | ICD-10-CM

## 2014-12-22 DIAGNOSIS — O24311 Unspecified pre-existing diabetes mellitus in pregnancy, first trimester: Secondary | ICD-10-CM

## 2014-12-22 LAB — POCT URINALYSIS DIP (DEVICE)
GLUCOSE, UA: NEGATIVE mg/dL
Hgb urine dipstick: NEGATIVE
Nitrite: NEGATIVE
PH: 6.5 (ref 5.0–8.0)
Protein, ur: 30 mg/dL — AB
Specific Gravity, Urine: 1.02 (ref 1.005–1.030)
Urobilinogen, UA: 1 mg/dL (ref 0.0–1.0)

## 2014-12-22 MED ORDER — GLUCOSE BLOOD VI STRP: ORAL_STRIP | Status: DC

## 2014-12-22 MED ORDER — ONETOUCH ULTRASOFT LANCETS MISC: Status: DC

## 2014-12-22 NOTE — Addendum Note (Signed)
Addended by: Shelly Coss on: 12/22/2014 11:00 AM   Modules accepted: Orders

## 2014-12-22 NOTE — Progress Notes (Signed)
24 y.o. G2P1001 at [redacted]w[redacted]d here for follow up OB visit. Doing well today. No concerns or complaints. 1. Type II DM. Continues to take metformin 500 mg BID. Reviewed blood glucose log today and all at goal. Congratulated on efforts. Continue diet/exericse. Follow up growth ultrasound scheduled for tomorrow. 2. Routine PNC. Lab work reviewed. FM/PTL precautions reviewed.

## 2014-12-23 ENCOUNTER — Encounter (HOSPITAL_COMMUNITY): Payer: Self-pay

## 2014-12-23 ENCOUNTER — Other Ambulatory Visit: Payer: Self-pay | Admitting: Obstetrics and Gynecology

## 2014-12-23 ENCOUNTER — Encounter (HOSPITAL_COMMUNITY): Payer: Self-pay | Admitting: Obstetrics & Gynecology

## 2014-12-23 ENCOUNTER — Ambulatory Visit (HOSPITAL_COMMUNITY)
Admission: RE | Admit: 2014-12-23 | Discharge: 2014-12-23 | Disposition: A | Discharge status: Home / Self Care | Payer: 59 | Source: Ambulatory Visit | Attending: Obstetrics and Gynecology | Admitting: Obstetrics and Gynecology

## 2014-12-23 VITALS — BP 109/61 | HR 81 | Wt 194.0 lb

## 2014-12-23 DIAGNOSIS — Z3A32 32 weeks gestation of pregnancy: Secondary | ICD-10-CM

## 2014-12-23 DIAGNOSIS — E119 Type 2 diabetes mellitus without complications: Secondary | ICD-10-CM | POA: Insufficient documentation

## 2014-12-23 DIAGNOSIS — O24312 Unspecified pre-existing diabetes mellitus in pregnancy, second trimester: Secondary | ICD-10-CM

## 2014-12-23 DIAGNOSIS — O0993 Supervision of high risk pregnancy, unspecified, third trimester: Secondary | ICD-10-CM

## 2014-12-23 DIAGNOSIS — D259 Leiomyoma of uterus, unspecified: Secondary | ICD-10-CM | POA: Insufficient documentation

## 2014-12-23 DIAGNOSIS — O3413 Maternal care for benign tumor of corpus uteri, third trimester: Secondary | ICD-10-CM | POA: Diagnosis not present

## 2014-12-23 DIAGNOSIS — B951 Streptococcus, group B, as the cause of diseases classified elsewhere: Secondary | ICD-10-CM

## 2014-12-23 DIAGNOSIS — Z3A28 28 weeks gestation of pregnancy: Secondary | ICD-10-CM | POA: Insufficient documentation

## 2014-12-23 DIAGNOSIS — O24113 Pre-existing diabetes mellitus, type 2, in pregnancy, third trimester: Secondary | ICD-10-CM | POA: Insufficient documentation

## 2014-12-23 DIAGNOSIS — O24313 Unspecified pre-existing diabetes mellitus in pregnancy, third trimester: Secondary | ICD-10-CM

## 2014-12-23 DIAGNOSIS — O2342 Unspecified infection of urinary tract in pregnancy, second trimester: Secondary | ICD-10-CM

## 2015-01-05 ENCOUNTER — Ambulatory Visit (INDEPENDENT_AMBULATORY_CARE_PROVIDER_SITE_OTHER): Payer: 59 | Admitting: Family Medicine

## 2015-01-05 VITALS — BP 117/73 | HR 88 | Wt 187.8 lb

## 2015-01-05 DIAGNOSIS — O24313 Unspecified pre-existing diabetes mellitus in pregnancy, third trimester: Secondary | ICD-10-CM

## 2015-01-05 DIAGNOSIS — O24913 Unspecified diabetes mellitus in pregnancy, third trimester: Secondary | ICD-10-CM

## 2015-01-05 DIAGNOSIS — O0993 Supervision of high risk pregnancy, unspecified, third trimester: Secondary | ICD-10-CM

## 2015-01-05 LAB — POCT URINALYSIS DIP (DEVICE)
BILIRUBIN URINE: NEGATIVE
Glucose, UA: NEGATIVE mg/dL
HGB URINE DIPSTICK: NEGATIVE
Ketones, ur: NEGATIVE mg/dL
LEUKOCYTES UA: NEGATIVE
NITRITE: NEGATIVE
PH: 7.5 (ref 5.0–8.0)
PROTEIN: NEGATIVE mg/dL
Specific Gravity, Urine: 1.02 (ref 1.005–1.030)
UROBILINOGEN UA: 0.2 mg/dL (ref 0.0–1.0)

## 2015-01-05 NOTE — Addendum Note (Signed)
Addended by: Nila Nephew on: 01/05/2015 10:19 AM   Modules accepted: Level of Service

## 2015-01-05 NOTE — Progress Notes (Signed)
Patient is 24 y.o. G2P1001 [redacted]w[redacted]d.  +FM, denies LOF, VB, contractions, vaginal discharge.  Overall feeling well. BDM:  Fasting: 4/13 out of range 94 94 90 90 2h PP: 14 all within range for bfast/lunch/dinner => well controlled, no change at this time.  Continue metformin 500mg  BID 28w sono nml, has f/u ordered

## 2015-01-19 ENCOUNTER — Ambulatory Visit (INDEPENDENT_AMBULATORY_CARE_PROVIDER_SITE_OTHER): Payer: 59 | Admitting: Obstetrics & Gynecology

## 2015-01-19 VITALS — BP 125/59 | HR 91 | Temp 97.2°F | Wt 192.4 lb

## 2015-01-19 DIAGNOSIS — O24913 Unspecified diabetes mellitus in pregnancy, third trimester: Secondary | ICD-10-CM

## 2015-01-19 DIAGNOSIS — O24313 Unspecified pre-existing diabetes mellitus in pregnancy, third trimester: Secondary | ICD-10-CM

## 2015-01-19 LAB — POCT URINALYSIS DIP (DEVICE)
Bilirubin Urine: NEGATIVE
GLUCOSE, UA: 100 mg/dL — AB
Hgb urine dipstick: NEGATIVE
Ketones, ur: 40 mg/dL — AB
NITRITE: NEGATIVE
Protein, ur: NEGATIVE mg/dL
Specific Gravity, Urine: 1.02 (ref 1.005–1.030)
Urobilinogen, UA: 0.2 mg/dL (ref 0.0–1.0)
pH: 7 (ref 5.0–8.0)

## 2015-01-19 NOTE — Progress Notes (Signed)
One abnormal fasting at 100, otherwise normal.  Suspicious as most values end in zero, patient did not bring meter today. Advised to bring next time for collaboration. Continue Metformin 1000 mg/500 mg for now.  Was not on schedule for NST today, will get BPP added on to scheduled growth scan on 01/20/15 and start 2x/week NST next week with weekly AFI. No other complaints or concerns.  Labor and fetal movement precautions reviewed.

## 2015-01-20 ENCOUNTER — Other Ambulatory Visit: Payer: Self-pay | Admitting: Obstetrics and Gynecology

## 2015-01-20 ENCOUNTER — Other Ambulatory Visit (HOSPITAL_COMMUNITY): Payer: Self-pay | Admitting: Maternal and Fetal Medicine

## 2015-01-20 ENCOUNTER — Ambulatory Visit (HOSPITAL_COMMUNITY)
Admission: RE | Admit: 2015-01-20 | Discharge: 2015-01-20 | Disposition: A | Discharge status: Home / Self Care | Payer: 59 | Source: Ambulatory Visit | Attending: Obstetrics and Gynecology | Admitting: Obstetrics and Gynecology

## 2015-01-20 ENCOUNTER — Encounter (HOSPITAL_COMMUNITY): Payer: Self-pay

## 2015-01-20 ENCOUNTER — Other Ambulatory Visit (HOSPITAL_COMMUNITY): Payer: Self-pay | Admitting: Obstetrics and Gynecology

## 2015-01-20 DIAGNOSIS — O24113 Pre-existing diabetes mellitus, type 2, in pregnancy, third trimester: Secondary | ICD-10-CM | POA: Insufficient documentation

## 2015-01-20 DIAGNOSIS — O24312 Unspecified pre-existing diabetes mellitus in pregnancy, second trimester: Secondary | ICD-10-CM

## 2015-01-20 DIAGNOSIS — O3410 Maternal care for benign tumor of corpus uteri, unspecified trimester: Secondary | ICD-10-CM | POA: Insufficient documentation

## 2015-01-20 DIAGNOSIS — Z3A32 32 weeks gestation of pregnancy: Secondary | ICD-10-CM | POA: Insufficient documentation

## 2015-01-20 DIAGNOSIS — Z3A36 36 weeks gestation of pregnancy: Secondary | ICD-10-CM

## 2015-01-20 DIAGNOSIS — O24313 Unspecified pre-existing diabetes mellitus in pregnancy, third trimester: Secondary | ICD-10-CM

## 2015-01-27 ENCOUNTER — Ambulatory Visit (INDEPENDENT_AMBULATORY_CARE_PROVIDER_SITE_OTHER): Payer: 59 | Admitting: Obstetrics & Gynecology

## 2015-01-27 VITALS — BP 113/64 | HR 91 | Wt 191.5 lb

## 2015-01-27 DIAGNOSIS — O24313 Unspecified pre-existing diabetes mellitus in pregnancy, third trimester: Secondary | ICD-10-CM

## 2015-01-27 DIAGNOSIS — O24913 Unspecified diabetes mellitus in pregnancy, third trimester: Secondary | ICD-10-CM | POA: Diagnosis not present

## 2015-01-27 DIAGNOSIS — O09893 Supervision of other high risk pregnancies, third trimester: Secondary | ICD-10-CM

## 2015-01-27 LAB — POCT URINALYSIS DIP (DEVICE)
Bilirubin Urine: NEGATIVE
GLUCOSE, UA: NEGATIVE mg/dL
Hgb urine dipstick: NEGATIVE
Ketones, ur: NEGATIVE mg/dL
Nitrite: NEGATIVE
Protein, ur: NEGATIVE mg/dL
SPECIFIC GRAVITY, URINE: 1.015 (ref 1.005–1.030)
Urobilinogen, UA: 1 mg/dL (ref 0.0–1.0)
pH: 7 (ref 5.0–8.0)

## 2015-01-27 NOTE — Patient Instructions (Signed)
Return to clinic for any obstetric concerns or go to MAU for evaluation  

## 2015-01-27 NOTE — Progress Notes (Signed)
[place] for growth scheduled @ MFM on 5/2

## 2015-01-27 NOTE — Progress Notes (Signed)
One abnormal fasting at 107; two abnormal breakfast postprandials at 130, 136; two abnormal dinner postprandials 130, 142.   Reports that she has not been checking with her own meter, daughter broke it. Since last two days, has been using her sister's (sister not currently using).  Values corroborated for past two days.  Actually takes Metformin 500 mg twice daily, will continue this for now. NST performed today was reviewed and was found to be reactive.  AFI normal at 20.4 cm.  Continue recommended antenatal testing and prenatal care. No other complaints or concerns.  Labor and fetal movement precautions reviewed.

## 2015-01-30 ENCOUNTER — Ambulatory Visit (INDEPENDENT_AMBULATORY_CARE_PROVIDER_SITE_OTHER): Payer: 59 | Admitting: *Deleted

## 2015-01-30 VITALS — BP 110/53 | HR 87

## 2015-01-30 DIAGNOSIS — O24913 Unspecified diabetes mellitus in pregnancy, third trimester: Secondary | ICD-10-CM | POA: Diagnosis not present

## 2015-01-30 DIAGNOSIS — O24313 Unspecified pre-existing diabetes mellitus in pregnancy, third trimester: Secondary | ICD-10-CM

## 2015-01-30 NOTE — Progress Notes (Signed)
NST reactive.

## 2015-02-02 ENCOUNTER — Ambulatory Visit (INDEPENDENT_AMBULATORY_CARE_PROVIDER_SITE_OTHER): Payer: 59 | Admitting: Obstetrics & Gynecology

## 2015-02-02 ENCOUNTER — Encounter: Payer: 59 | Admitting: Obstetrics and Gynecology

## 2015-02-02 ENCOUNTER — Other Ambulatory Visit: Payer: 59

## 2015-02-02 VITALS — BP 116/55 | HR 88 | Temp 98.1°F | Wt 191.5 lb

## 2015-02-02 DIAGNOSIS — O24913 Unspecified diabetes mellitus in pregnancy, third trimester: Secondary | ICD-10-CM

## 2015-02-02 DIAGNOSIS — O0993 Supervision of high risk pregnancy, unspecified, third trimester: Secondary | ICD-10-CM

## 2015-02-02 DIAGNOSIS — O24313 Unspecified pre-existing diabetes mellitus in pregnancy, third trimester: Secondary | ICD-10-CM

## 2015-02-02 LAB — POCT URINALYSIS DIP (DEVICE)
Bilirubin Urine: NEGATIVE
Glucose, UA: 100 mg/dL — AB
Hgb urine dipstick: NEGATIVE
KETONES UR: NEGATIVE mg/dL
Nitrite: NEGATIVE
PROTEIN: 30 mg/dL — AB
Specific Gravity, Urine: 1.02 (ref 1.005–1.030)
Urobilinogen, UA: 1 mg/dL (ref 0.0–1.0)
pH: 7 (ref 5.0–8.0)

## 2015-02-02 NOTE — Progress Notes (Signed)
OBF/NST/AFI

## 2015-02-02 NOTE — Patient Instructions (Signed)
Third Trimester of Pregnancy The third trimester is from week 29 through week 42, months 7 through 9. The third trimester is a time when the fetus is growing rapidly. At the end of the ninth month, the fetus is about 20 inches in length and weighs 6-10 pounds.  BODY CHANGES Your body goes through many changes during pregnancy. The changes vary from woman to woman.   Your weight will continue to increase. You can expect to gain 25-35 pounds (11-16 kg) by the end of the pregnancy.  You may begin to get stretch marks on your hips, abdomen, and breasts.  You may urinate more often because the fetus is moving lower into your pelvis and pressing on your bladder.  You may develop or continue to have heartburn as a result of your pregnancy.  You may develop constipation because certain hormones are causing the muscles that push waste through your intestines to slow down.  You may develop hemorrhoids or swollen, bulging veins (varicose veins).  You may have pelvic pain because of the weight gain and pregnancy hormones relaxing your joints between the bones in your pelvis. Backaches may result from overexertion of the muscles supporting your posture.  You may have changes in your hair. These can include thickening of your hair, rapid growth, and changes in texture. Some women also have hair loss during or after pregnancy, or hair that feels dry or thin. Your hair will most likely return to normal after your baby is born.  Your breasts will continue to grow and be tender. A yellow discharge may leak from your breasts called colostrum.  Your belly button may stick out.  You may feel short of breath because of your expanding uterus.  You may notice the fetus "dropping," or moving lower in your abdomen.  You may have a bloody mucus discharge. This usually occurs a few days to a week before labor begins.  Your cervix becomes thin and soft (effaced) near your due date. WHAT TO EXPECT AT YOUR PRENATAL  EXAMS  You will have prenatal exams every 2 weeks until week 36. Then, you will have weekly prenatal exams. During a routine prenatal visit:  You will be weighed to make sure you and the fetus are growing normally.  Your blood pressure is taken.  Your abdomen will be measured to track your baby's growth.  The fetal heartbeat will be listened to.  Any test results from the previous visit will be discussed.  You may have a cervical check near your due date to see if you have effaced. At around 36 weeks, your caregiver will check your cervix. At the same time, your caregiver will also perform a test on the secretions of the vaginal tissue. This test is to determine if a type of bacteria, Group B streptococcus, is present. Your caregiver will explain this further. Your caregiver may ask you:  What your birth plan is.  How you are feeling.  If you are feeling the baby move.  If you have had any abnormal symptoms, such as leaking fluid, bleeding, severe headaches, or abdominal cramping.  If you have any questions. Other tests or screenings that may be performed during your third trimester include:  Blood tests that check for low iron levels (anemia).  Fetal testing to check the health, activity level, and growth of the fetus. Testing is done if you have certain medical conditions or if there are problems during the pregnancy. FALSE LABOR You may feel small, irregular contractions that   eventually go away. These are called Braxton Hicks contractions, or false labor. Contractions may last for hours, days, or even weeks before true labor sets in. If contractions come at regular intervals, intensify, or become painful, it is best to be seen by your caregiver.  SIGNS OF LABOR   Menstrual-like cramps.  Contractions that are 5 minutes apart or less.  Contractions that start on the top of the uterus and spread down to the lower abdomen and back.  A sense of increased pelvic pressure or back  pain.  A watery or bloody mucus discharge that comes from the vagina. If you have any of these signs before the 37th week of pregnancy, call your caregiver right away. You need to go to the hospital to get checked immediately. HOME CARE INSTRUCTIONS   Avoid all smoking, herbs, alcohol, and unprescribed drugs. These chemicals affect the formation and growth of the baby.  Follow your caregiver's instructions regarding medicine use. There are medicines that are either safe or unsafe to take during pregnancy.  Exercise only as directed by your caregiver. Experiencing uterine cramps is a good sign to stop exercising.  Continue to eat regular, healthy meals.  Wear a good support bra for breast tenderness.  Do not use hot tubs, steam rooms, or saunas.  Wear your seat belt at all times when driving.  Avoid raw meat, uncooked cheese, cat litter boxes, and soil used by cats. These carry germs that can cause birth defects in the baby.  Take your prenatal vitamins.  Try taking a stool softener (if your caregiver approves) if you develop constipation. Eat more high-fiber foods, such as fresh vegetables or fruit and whole grains. Drink plenty of fluids to keep your urine clear or pale yellow.  Take warm sitz baths to soothe any pain or discomfort caused by hemorrhoids. Use hemorrhoid cream if your caregiver approves.  If you develop varicose veins, wear support hose. Elevate your feet for 15 minutes, 3-4 times a day. Limit salt in your diet.  Avoid heavy lifting, wear low heal shoes, and practice good posture.  Rest a lot with your legs elevated if you have leg cramps or low back pain.  Visit your dentist if you have not gone during your pregnancy. Use a soft toothbrush to brush your teeth and be gentle when you floss.  A sexual relationship may be continued unless your caregiver directs you otherwise.  Do not travel far distances unless it is absolutely necessary and only with the approval  of your caregiver.  Take prenatal classes to understand, practice, and ask questions about the labor and delivery.  Make a trial run to the hospital.  Pack your hospital bag.  Prepare the baby's nursery.  Continue to go to all your prenatal visits as directed by your caregiver. SEEK MEDICAL CARE IF:  You are unsure if you are in labor or if your water has broken.  You have dizziness.  You have mild pelvic cramps, pelvic pressure, or nagging pain in your abdominal area.  You have persistent nausea, vomiting, or diarrhea.  You have a bad smelling vaginal discharge.  You have pain with urination. SEEK IMMEDIATE MEDICAL CARE IF:   You have a fever.  You are leaking fluid from your vagina.  You have spotting or bleeding from your vagina.  You have severe abdominal cramping or pain.  You have rapid weight loss or gain.  You have shortness of breath with chest pain.  You notice sudden or extreme swelling   of your face, hands, ankles, feet, or legs.  You have not felt your baby move in over an hour.  You have severe headaches that do not go away with medicine.  You have vision changes. Document Released: 09/27/2001 Document Revised: 10/08/2013 Document Reviewed: 12/04/2012 Exit[employer] Patient Information 2015 [employer]. This information is not intended to replace advice given to you by your health care provider. Make sure you discuss any questions you have with your health care provider.  

## 2015-02-02 NOTE — Progress Notes (Signed)
NST reactive, FBS 66-112, most in range, PP all<130, continue med as Rx.

## 2015-02-02 NOTE — Progress Notes (Signed)
Moderate Leukocytes in urine.

## 2015-02-06 ENCOUNTER — Ambulatory Visit (INDEPENDENT_AMBULATORY_CARE_PROVIDER_SITE_OTHER): Payer: 59 | Admitting: *Deleted

## 2015-02-06 VITALS — BP 111/56 | HR 85

## 2015-02-06 DIAGNOSIS — O24313 Unspecified pre-existing diabetes mellitus in pregnancy, third trimester: Secondary | ICD-10-CM

## 2015-02-06 DIAGNOSIS — O24913 Unspecified diabetes mellitus in pregnancy, third trimester: Secondary | ICD-10-CM

## 2015-02-06 NOTE — Progress Notes (Signed)
NST reactive.

## 2015-02-09 ENCOUNTER — Ambulatory Visit (INDEPENDENT_AMBULATORY_CARE_PROVIDER_SITE_OTHER): Payer: 59 | Admitting: Family Medicine

## 2015-02-09 VITALS — BP 124/56 | HR 83 | Temp 97.4°F | Wt 191.5 lb

## 2015-02-09 DIAGNOSIS — O24913 Unspecified diabetes mellitus in pregnancy, third trimester: Secondary | ICD-10-CM | POA: Diagnosis not present

## 2015-02-09 DIAGNOSIS — B951 Streptococcus, group B, as the cause of diseases classified elsewhere: Secondary | ICD-10-CM

## 2015-02-09 DIAGNOSIS — O2343 Unspecified infection of urinary tract in pregnancy, third trimester: Secondary | ICD-10-CM

## 2015-02-09 DIAGNOSIS — O24313 Unspecified pre-existing diabetes mellitus in pregnancy, third trimester: Secondary | ICD-10-CM

## 2015-02-09 DIAGNOSIS — O0993 Supervision of high risk pregnancy, unspecified, third trimester: Secondary | ICD-10-CM

## 2015-02-09 DIAGNOSIS — O2342 Unspecified infection of urinary tract in pregnancy, second trimester: Secondary | ICD-10-CM

## 2015-02-09 LAB — POCT URINALYSIS DIP (DEVICE)
Bilirubin Urine: NEGATIVE
GLUCOSE, UA: NEGATIVE mg/dL
Hgb urine dipstick: NEGATIVE
Ketones, ur: NEGATIVE mg/dL
Leukocytes, UA: NEGATIVE
Nitrite: NEGATIVE
PH: 7 (ref 5.0–8.0)
Protein, ur: NEGATIVE mg/dL
Specific Gravity, Urine: 1.02 (ref 1.005–1.030)
Urobilinogen, UA: 1 mg/dL (ref 0.0–1.0)

## 2015-02-09 NOTE — Progress Notes (Signed)
NST reactive, AFI normal. No complaints.   Has been taking metformin 500mg  in AM and in PM. Fasting CBG in range, 2 hr PP mostly in range, except for post- diner - most 120-130.   Increase metformin to 1000mg  in AM and 500mg  in PM.  A lot of her numbers end in "0".  Asked patient to not round numbers, but to record the actual number.

## 2015-02-09 NOTE — Progress Notes (Signed)
OBF/NST/AFI

## 2015-02-13 ENCOUNTER — Ambulatory Visit (INDEPENDENT_AMBULATORY_CARE_PROVIDER_SITE_OTHER): Payer: 59 | Admitting: *Deleted

## 2015-02-13 VITALS — BP 111/55 | HR 87

## 2015-02-13 DIAGNOSIS — O24913 Unspecified diabetes mellitus in pregnancy, third trimester: Secondary | ICD-10-CM | POA: Diagnosis not present

## 2015-02-13 DIAGNOSIS — O24313 Unspecified pre-existing diabetes mellitus in pregnancy, third trimester: Secondary | ICD-10-CM

## 2015-02-16 ENCOUNTER — Encounter (HOSPITAL_COMMUNITY): Payer: Self-pay

## 2015-02-16 ENCOUNTER — Ambulatory Visit (INDEPENDENT_AMBULATORY_CARE_PROVIDER_SITE_OTHER): Payer: 59 | Admitting: Family Medicine

## 2015-02-16 ENCOUNTER — Ambulatory Visit (HOSPITAL_COMMUNITY)
Admission: RE | Admit: 2015-02-16 | Discharge: 2015-02-16 | Disposition: A | Discharge status: Home / Self Care | Payer: 59 | Source: Ambulatory Visit | Attending: Obstetrics & Gynecology | Admitting: Obstetrics & Gynecology

## 2015-02-16 VITALS — BP 115/64 | HR 90 | Wt 192.5 lb

## 2015-02-16 DIAGNOSIS — O24313 Unspecified pre-existing diabetes mellitus in pregnancy, third trimester: Secondary | ICD-10-CM

## 2015-02-16 DIAGNOSIS — O24113 Pre-existing diabetes mellitus, type 2, in pregnancy, third trimester: Secondary | ICD-10-CM | POA: Diagnosis not present

## 2015-02-16 DIAGNOSIS — E119 Type 2 diabetes mellitus without complications: Secondary | ICD-10-CM | POA: Insufficient documentation

## 2015-02-16 DIAGNOSIS — Z3A36 36 weeks gestation of pregnancy: Secondary | ICD-10-CM | POA: Insufficient documentation

## 2015-02-16 DIAGNOSIS — O3413 Maternal care for benign tumor of corpus uteri, third trimester: Secondary | ICD-10-CM | POA: Insufficient documentation

## 2015-02-16 DIAGNOSIS — O24312 Unspecified pre-existing diabetes mellitus in pregnancy, second trimester: Secondary | ICD-10-CM

## 2015-02-16 DIAGNOSIS — D259 Leiomyoma of uterus, unspecified: Secondary | ICD-10-CM | POA: Diagnosis not present

## 2015-02-16 DIAGNOSIS — O24913 Unspecified diabetes mellitus in pregnancy, third trimester: Secondary | ICD-10-CM | POA: Diagnosis not present

## 2015-02-16 DIAGNOSIS — Z36 Encounter for antenatal screening of mother: Secondary | ICD-10-CM | POA: Diagnosis not present

## 2015-02-16 LAB — POCT URINALYSIS DIP (DEVICE)
Bilirubin Urine: NEGATIVE
Glucose, UA: 250 mg/dL — AB
HGB URINE DIPSTICK: NEGATIVE
KETONES UR: NEGATIVE mg/dL
Nitrite: NEGATIVE
Protein, ur: NEGATIVE mg/dL
Specific Gravity, Urine: 1.02 (ref 1.005–1.030)
UROBILINOGEN UA: 1 mg/dL (ref 0.0–1.0)
pH: 7 (ref 5.0–8.0)

## 2015-02-16 LAB — OB RESULTS CONSOLE GC/CHLAMYDIA
Chlamydia: NEGATIVE
Gonorrhea: NEGATIVE

## 2015-02-16 NOTE — Progress Notes (Signed)
Patient is 24 y.o. G2P1001 [redacted]w[redacted]d.  +FM, denies LOF, VB, contractions, vaginal discharge.  Overall feeling well. A2DM: overall well controlled on metformin, has growth sono today, NST reactive  Fasting: 2 abn 92, 101  2h PP 1abn 122  2h pp lunch:  nml  2h PP dinner: 3 abn 120 127 128  - repeat G/C today, no GBS 2/2 GBS positive urine cx

## 2015-02-16 NOTE — Progress Notes (Signed)
Leukocytes: Trace [place] for growth @ [facility]FM today

## 2015-02-17 ENCOUNTER — Ambulatory Visit (HOSPITAL_COMMUNITY): Payer: 59

## 2015-02-17 LAB — GC/CHLAMYDIA PROBE AMP
CT PROBE, AMP APTIMA: NEGATIVE
GC Probe RNA: NEGATIVE

## 2015-02-20 ENCOUNTER — Ambulatory Visit (INDEPENDENT_AMBULATORY_CARE_PROVIDER_SITE_OTHER): Payer: 59 | Admitting: *Deleted

## 2015-02-20 VITALS — BP 120/62 | HR 94

## 2015-02-20 DIAGNOSIS — O24313 Unspecified pre-existing diabetes mellitus in pregnancy, third trimester: Secondary | ICD-10-CM

## 2015-02-20 DIAGNOSIS — O24913 Unspecified diabetes mellitus in pregnancy, third trimester: Secondary | ICD-10-CM | POA: Diagnosis not present

## 2015-02-23 ENCOUNTER — Ambulatory Visit (INDEPENDENT_AMBULATORY_CARE_PROVIDER_SITE_OTHER): Payer: 59 | Admitting: Family Medicine

## 2015-02-23 VITALS — BP 106/65 | HR 87 | Temp 97.3°F | Wt 192.6 lb

## 2015-02-23 DIAGNOSIS — O24913 Unspecified diabetes mellitus in pregnancy, third trimester: Secondary | ICD-10-CM

## 2015-02-23 DIAGNOSIS — O24313 Unspecified pre-existing diabetes mellitus in pregnancy, third trimester: Secondary | ICD-10-CM

## 2015-02-23 LAB — POCT URINALYSIS DIP (DEVICE)
Bilirubin Urine: NEGATIVE
Glucose, UA: NEGATIVE mg/dL
Hgb urine dipstick: NEGATIVE
Ketones, ur: NEGATIVE mg/dL
NITRITE: NEGATIVE
PH: 7 (ref 5.0–8.0)
PROTEIN: NEGATIVE mg/dL
Specific Gravity, Urine: 1.02 (ref 1.005–1.030)
Urobilinogen, UA: 0.2 mg/dL (ref 0.0–1.0)

## 2015-02-23 NOTE — Progress Notes (Signed)
Mod leuks on UA

## 2015-02-23 NOTE — Progress Notes (Signed)
Routine/NST/AFI [place] for growth on 5/16,  IOL scheduled 5/22 @ 0800

## 2015-02-23 NOTE — Progress Notes (Signed)
NST - reactive Fasting - 1 elevated 2hr PP - a couple elevated Keep medication the same. Labor and FM precautions given.

## 2015-02-27 ENCOUNTER — Ambulatory Visit (INDEPENDENT_AMBULATORY_CARE_PROVIDER_SITE_OTHER): Payer: 59 | Admitting: *Deleted

## 2015-02-27 VITALS — BP 121/66 | HR 96

## 2015-02-27 DIAGNOSIS — O24313 Unspecified pre-existing diabetes mellitus in pregnancy, third trimester: Secondary | ICD-10-CM

## 2015-02-27 DIAGNOSIS — O24913 Unspecified diabetes mellitus in pregnancy, third trimester: Secondary | ICD-10-CM | POA: Diagnosis not present

## 2015-03-02 ENCOUNTER — Ambulatory Visit (HOSPITAL_COMMUNITY)
Admission: RE | Admit: 2015-03-02 | Discharge: 2015-03-02 | Disposition: A | Discharge status: Home / Self Care | Payer: 59 | Source: Ambulatory Visit | Attending: Obstetrics & Gynecology | Admitting: Obstetrics & Gynecology

## 2015-03-02 ENCOUNTER — Ambulatory Visit (INDEPENDENT_AMBULATORY_CARE_PROVIDER_SITE_OTHER): Payer: 59 | Admitting: Obstetrics & Gynecology

## 2015-03-02 VITALS — BP 109/66 | HR 97 | Wt 193.6 lb

## 2015-03-02 DIAGNOSIS — O24913 Unspecified diabetes mellitus in pregnancy, third trimester: Secondary | ICD-10-CM | POA: Insufficient documentation

## 2015-03-02 DIAGNOSIS — O24313 Unspecified pre-existing diabetes mellitus in pregnancy, third trimester: Secondary | ICD-10-CM

## 2015-03-02 LAB — POCT URINALYSIS DIP (DEVICE)
Bilirubin Urine: NEGATIVE
GLUCOSE, UA: NEGATIVE mg/dL
HGB URINE DIPSTICK: NEGATIVE
KETONES UR: NEGATIVE mg/dL
Nitrite: NEGATIVE
Protein, ur: NEGATIVE mg/dL
Specific Gravity, Urine: 1.01 (ref 1.005–1.030)
UROBILINOGEN UA: 0.2 mg/dL (ref 0.0–1.0)
pH: 7 (ref 5.0–8.0)

## 2015-03-02 NOTE — Progress Notes (Signed)
On review of BS, a couple of PP in 130s, otherwise within normal limits NST performed today was reviewed and was found to be reactive.  Will have IOL on 03/08/15.  Follow up growth scan to be performed later today. No other complaints or concerns.  Labor and fetal movement precautions reviewed.

## 2015-03-02 NOTE — Patient Instructions (Signed)
Return to clinic for any obstetric concerns or go to MAU for evaluation  

## 2015-03-02 NOTE — Progress Notes (Signed)
[place] for growth today @ 1045.  IOL 5/22 @ 0800

## 2015-03-04 ENCOUNTER — Telehealth (HOSPITAL_COMMUNITY): Payer: Self-pay | Admitting: *Deleted

## 2015-03-04 NOTE — Progress Notes (Signed)
NST reactive.

## 2015-03-04 NOTE — Telephone Encounter (Signed)
Preadmission screen  

## 2015-03-05 ENCOUNTER — Ambulatory Visit (INDEPENDENT_AMBULATORY_CARE_PROVIDER_SITE_OTHER): Payer: 59 | Admitting: *Deleted

## 2015-03-05 VITALS — BP 114/62 | HR 84

## 2015-03-05 DIAGNOSIS — O24913 Unspecified diabetes mellitus in pregnancy, third trimester: Secondary | ICD-10-CM | POA: Diagnosis not present

## 2015-03-05 DIAGNOSIS — O24313 Unspecified pre-existing diabetes mellitus in pregnancy, third trimester: Secondary | ICD-10-CM

## 2015-03-05 NOTE — Progress Notes (Signed)
NST performed today was reviewed and was found to be reactive.  IOL 03/08/15.

## 2015-03-05 NOTE — Progress Notes (Signed)
IOL 5/22.

## 2015-03-08 ENCOUNTER — Inpatient Hospital Stay (HOSPITAL_COMMUNITY)
Admission: RE | Admit: 2015-03-08 | Discharge: 2015-03-11 | DRG: 775 | Disposition: A | Discharge status: Home / Self Care | Payer: 59 | Source: Ambulatory Visit | Attending: Obstetrics & Gynecology | Admitting: Obstetrics & Gynecology

## 2015-03-08 ENCOUNTER — Encounter (HOSPITAL_COMMUNITY): Payer: Self-pay

## 2015-03-08 VITALS — BP 122/65 | HR 95 | Temp 98.1°F | Resp 18 | Ht 63.0 in | Wt 193.0 lb

## 2015-03-08 DIAGNOSIS — O99824 Streptococcus B carrier state complicating childbirth: Secondary | ICD-10-CM | POA: Diagnosis present

## 2015-03-08 DIAGNOSIS — O24424 Gestational diabetes mellitus in childbirth, insulin controlled: Secondary | ICD-10-CM | POA: Diagnosis present

## 2015-03-08 DIAGNOSIS — O24414 Gestational diabetes mellitus in pregnancy, insulin controlled: Secondary | ICD-10-CM | POA: Diagnosis present

## 2015-03-08 DIAGNOSIS — G8929 Other chronic pain: Secondary | ICD-10-CM | POA: Diagnosis present

## 2015-03-08 DIAGNOSIS — O24429 Gestational diabetes mellitus in childbirth, unspecified control: Secondary | ICD-10-CM | POA: Diagnosis not present

## 2015-03-08 DIAGNOSIS — O3413 Maternal care for benign tumor of corpus uteri, third trimester: Secondary | ICD-10-CM | POA: Diagnosis present

## 2015-03-08 DIAGNOSIS — O99354 Diseases of the nervous system complicating childbirth: Secondary | ICD-10-CM | POA: Diagnosis present

## 2015-03-08 DIAGNOSIS — O2342 Unspecified infection of urinary tract in pregnancy, second trimester: Secondary | ICD-10-CM

## 2015-03-08 DIAGNOSIS — M549 Dorsalgia, unspecified: Secondary | ICD-10-CM | POA: Diagnosis present

## 2015-03-08 DIAGNOSIS — Z3A39 39 weeks gestation of pregnancy: Secondary | ICD-10-CM | POA: Diagnosis present

## 2015-03-08 DIAGNOSIS — D259 Leiomyoma of uterus, unspecified: Secondary | ICD-10-CM | POA: Diagnosis present

## 2015-03-08 DIAGNOSIS — B951 Streptococcus, group B, as the cause of diseases classified elsewhere: Secondary | ICD-10-CM

## 2015-03-08 DIAGNOSIS — O9989 Other specified diseases and conditions complicating pregnancy, childbirth and the puerperium: Secondary | ICD-10-CM | POA: Diagnosis present

## 2015-03-08 DIAGNOSIS — O24313 Unspecified pre-existing diabetes mellitus in pregnancy, third trimester: Secondary | ICD-10-CM

## 2015-03-08 DIAGNOSIS — O09892 Supervision of other high risk pregnancies, second trimester: Secondary | ICD-10-CM

## 2015-03-08 DIAGNOSIS — Z3490 Encounter for supervision of normal pregnancy, unspecified, unspecified trimester: Secondary | ICD-10-CM

## 2015-03-08 DIAGNOSIS — O0993 Supervision of high risk pregnancy, unspecified, third trimester: Secondary | ICD-10-CM

## 2015-03-08 LAB — CBC
HCT: 31.9 % — ABNORMAL LOW (ref 36.0–46.0)
Hemoglobin: 10.9 g/dL — ABNORMAL LOW (ref 12.0–15.0)
MCH: 28.8 pg (ref 26.0–34.0)
MCHC: 34.2 g/dL (ref 30.0–36.0)
MCV: 84.2 fL (ref 78.0–100.0)
Platelets: 217 10*3/uL (ref 150–400)
RBC: 3.79 MIL/uL — AB (ref 3.87–5.11)
RDW: 13.1 % (ref 11.5–15.5)
WBC: 7.9 10*3/uL (ref 4.0–10.5)

## 2015-03-08 LAB — TYPE AND SCREEN
ABO/RH(D): O POS
ANTIBODY SCREEN: NEGATIVE

## 2015-03-08 LAB — GLUCOSE, CAPILLARY: Glucose-Capillary: 71 mg/dL (ref 65–99)

## 2015-03-08 LAB — RPR: RPR Ser Ql: NONREACTIVE

## 2015-03-08 MED ORDER — LACTATED RINGERS IV SOLN
INTRAVENOUS | Status: DC
  Administered 2015-03-08 (×2): via INTRAVENOUS
  Administered 2015-03-09: 125 mL/h via INTRAVENOUS
  Administered 2015-03-09: 05:00:00 via INTRAVENOUS

## 2015-03-08 MED ORDER — OXYTOCIN BOLUS FROM INFUSION: 500.0000 mL | INTRAVENOUS | Status: DC

## 2015-03-08 MED ORDER — ONDANSETRON HCL 4 MG/2ML IJ SOLN: 4.0000 mg | Freq: Four times a day (QID) | INTRAMUSCULAR | Status: DC | PRN

## 2015-03-08 MED ORDER — TERBUTALINE SULFATE 1 MG/ML IJ SOLN: 0.2500 mg | Freq: Once | INTRAMUSCULAR | Status: AC | PRN

## 2015-03-08 MED ORDER — CITRIC ACID-SODIUM CITRATE 334-500 MG/5ML PO SOLN: 30.0000 mL | ORAL | Status: DC | PRN

## 2015-03-08 MED ORDER — LACTATED RINGERS IV SOLN
500.0000 mL | INTRAVENOUS | Status: DC | PRN
  Administered 2015-03-09: 500 mL via INTRAVENOUS

## 2015-03-08 MED ORDER — LIDOCAINE HCL (PF) 1 % IJ SOLN
30.0000 mL | INTRAMUSCULAR | Status: DC | PRN
  Filled 2015-03-08: qty 30

## 2015-03-08 MED ORDER — FLEET ENEMA 7-19 GM/118ML RE ENEM: 1.0000 | ENEMA | RECTAL | Status: DC | PRN

## 2015-03-08 MED ORDER — OXYTOCIN 40 UNITS IN LACTATED RINGERS INFUSION - SIMPLE MED
1.0000 m[IU]/min | INTRAVENOUS | Status: DC
  Administered 2015-03-08: 2 m[IU]/min via INTRAVENOUS
  Filled 2015-03-08 (×2): qty 1000

## 2015-03-08 MED ORDER — OXYCODONE-ACETAMINOPHEN 5-325 MG PO TABS: 1.0000 | ORAL_TABLET | ORAL | Status: DC | PRN

## 2015-03-08 MED ORDER — MISOPROSTOL 25 MCG QUARTER TABLET
25.0000 ug | ORAL_TABLET | ORAL | Status: DC | PRN
  Administered 2015-03-08 – 2015-03-09 (×2): 25 ug via VAGINAL
  Filled 2015-03-08 (×2): qty 0.25

## 2015-03-08 MED ORDER — PENICILLIN G POTASSIUM 5000000 UNITS IJ SOLR
5.0000 10*6.[IU] | Freq: Once | INTRAVENOUS | Status: AC
  Administered 2015-03-08: 5 10*6.[IU] via INTRAVENOUS
  Filled 2015-03-08: qty 5

## 2015-03-08 MED ORDER — ACETAMINOPHEN 325 MG PO TABS: 650.0000 mg | ORAL_TABLET | ORAL | Status: DC | PRN

## 2015-03-08 MED ORDER — OXYTOCIN 40 UNITS IN LACTATED RINGERS INFUSION - SIMPLE MED
62.5000 mL/h | INTRAVENOUS | Status: DC
  Administered 2015-03-09: 62.5 mL/h via INTRAVENOUS

## 2015-03-08 MED ORDER — PENICILLIN G POTASSIUM 5000000 UNITS IJ SOLR
2.5000 10*6.[IU] | INTRAVENOUS | Status: DC
  Administered 2015-03-08 – 2015-03-09 (×7): 2.5 10*6.[IU] via INTRAVENOUS
  Filled 2015-03-08 (×11): qty 2.5

## 2015-03-08 MED ORDER — OXYCODONE-ACETAMINOPHEN 5-325 MG PO TABS
2.0000 | ORAL_TABLET | ORAL | Status: DC | PRN
Start: 2015-03-08 — End: 2015-03-09

## 2015-03-08 NOTE — H&P (Signed)
Jasmin Ellis is a 24 y.o. female presenting for IOL at 39wks for GDM. History OB History    Gravida Para Term Preterm AB TAB SAB Ectopic Multiple Living   2 1 1  0 0 0 0 0 0 1     Past Medical History  Diagnosis Date  . Back pain, chronic     s/p fall 8 years ago  . Fibroids   . Diabetes mellitus without complication    History reviewed. No pertinent past surgical history. Family History: family history is not on file. Social History:  reports that she has never smoked. She has never used smokeless tobacco. She reports that she does not drink alcohol or use illicit drugs.   Prenatal Transfer Tool  Maternal Diabetes: Yes:  Diabetes Type:  Insulin/Medication controlled Genetic Screening: Normal Maternal Ultrasounds/Referrals: Normal Fetal Ultrasounds or other Referrals:  None Maternal Substance Abuse:  No Significant Maternal Medications:  Meds include: Other:  Significant Maternal Lab Results:  Lab values include: Group B Strep positive Other Comments:  None  Review of Systems  Constitutional: Negative.   HENT: Negative.   Eyes: Negative.   Respiratory: Negative.   Cardiovascular: Negative.   Gastrointestinal: Negative.   Genitourinary: Negative.   Musculoskeletal: Negative.   Skin: Negative.   Neurological: Negative.   Endo/Heme/Allergies: Negative.   Psychiatric/Behavioral: Negative.       Blood pressure 137/77, pulse 88, temperature 98 F (36.7 C), temperature source Oral, resp. rate 18, height 5\' 3"  (1.6 m), weight 193 lb (87.544 kg), last menstrual period 05/29/2014. Maternal Exam:  Uterine Assessment: none  Abdomen: Patient reports no abdominal tenderness. Fetal presentation: vertex  Introitus: Normal vulva. Normal vagina.  Pelvis: adequate for delivery.   Cervix: Cervix evaluated by digital exam.     Fetal Exam Fetal Monitor Review: Mode: ultrasound.   Variability: moderate (6-25 bpm).   Pattern: accelerations present.    Fetal State Assessment:  Category I - tracings are normal.     Physical Exam  Constitutional: She is oriented to person, place, and time. She appears well-developed and well-nourished.  HENT:  Head: Normocephalic.  Neck: Normal range of motion.  Cardiovascular: Normal rate, regular rhythm, normal heart sounds and intact distal pulses.   Respiratory: Effort normal and breath sounds normal.  GI: Soft. Bowel sounds are normal.  Genitourinary: Vagina normal and uterus normal.  Musculoskeletal: Normal range of motion.  Neurological: She is alert and oriented to person, place, and time. She has normal reflexes.  Skin: Skin is warm and dry.  Psychiatric: She has a normal mood and affect. Her behavior is normal. Judgment and thought content normal.    Prenatal labs: ABO, Rh: O/POS/-- (11/09 1427) Antibody: NEG (11/09 1427) Rubella: 20.20 (11/09 1427) RPR: Non Reactive (02/10 1930)  HBsAg: NEGATIVE (11/09 1427)  HIV: NONREACTIVE (02/22 1417)  GBS: Positive (11/13 0000)   Assessment/Plan: Admit for iol of labor for GDM   LAWSON, MARIE DARLENE 03/08/2015, 9:21 AM

## 2015-03-08 NOTE — Progress Notes (Signed)
[facility] notified pt is on 59mu/min of pitocin, contracting every 2-3 minutes and not hurting.  CNM stated to continue with pitocin at this time.

## 2015-03-08 NOTE — Progress Notes (Signed)
Jasmin Ellis is a 24 y.o. G2P1001 at [redacted]w[redacted]d by ultrasound admitted for induction of labor due to GDM.  Subjective:   Objective: BP 126/62 mmHg  Pulse 75  Temp(Src) 98.1 F (36.7 C) (Oral)  Resp 18  Ht 5\' 3"  (1.6 m)  Wt 193 lb (87.544 kg)  BMI 34.20 kg/m2  LMP 05/29/2014      FHT:  FHR: 120's bpm, variability: moderate,  accelerations:  Present,  decelerations:  Absent UC:   regular, every 2-3 minutes and mild intensity SVE:   Dilation: 3 Effacement (%): 50 Station: -2 Exam by:: Evonnie Dawes, rn  Labs: Lab Results  Component Value Date   WBC 7.9 03/08/2015   HGB 10.9* 03/08/2015   HCT 31.9* 03/08/2015   MCV 84.2 03/08/2015   PLT 217 03/08/2015    Assessment / Plan: Induction of labor due to gestational diabetes,  progressing well on pitocin  Labor: if no cervical chage by 2000 will stop pit and will cytotec thru night Preeclampsia:  no signs or symptoms of toxicity Fetal Wellbeing:  Category I Pain Control:  Labor support without medications I/D:  n/a Anticipated MOD:  NSVD  Jasmin Ellis 03/08/2015, 5:00 PM

## 2015-03-08 NOTE — Progress Notes (Signed)
Jasmin Ellis is a 24 y.o. G2P1001 at [redacted]w[redacted]d by ultrasound admitted for induction of labor due to Gestational diabetes.  Subjective:   Objective: BP 122/76 mmHg  Pulse 71  Temp(Src) 98.1 F (36.7 C) (Oral)  Resp 18  Ht 5\' 3"  (1.6 m)  Wt 193 lb (87.544 kg)  BMI 34.20 kg/m2  LMP 05/29/2014      FHT:  FHR: 125 bpm, variability: moderate,  accelerations:  Present,  decelerations:  Absent UC:   regular, every 2 minutes SVE:   Dilation: 3 Effacement (%): Thick Station: -2 Exam by:: Daiva Nakayama, cnm  Labs: Lab Results  Component Value Date   WBC 7.9 03/08/2015   HGB 10.9* 03/08/2015   HCT 31.9* 03/08/2015   MCV 84.2 03/08/2015   PLT 217 03/08/2015    Assessment / Plan: Induction of labor due to gestational diabetes,  progressing well on pitocin  Labor: Progressing on Pitocin, will continue to increase then AROM Preeclampsia:  no signs or symptoms of toxicity Fetal Wellbeing:  Category I Pain Control:  Labor support without medications I/D:  n/a Anticipated MOD:  NSVD  Jasmin Ellis DARLENE 03/08/2015, 2:07 PM

## 2015-03-09 ENCOUNTER — Inpatient Hospital Stay (HOSPITAL_COMMUNITY): Payer: 59 | Admitting: Anesthesiology

## 2015-03-09 ENCOUNTER — Encounter (HOSPITAL_COMMUNITY): Payer: Self-pay

## 2015-03-09 DIAGNOSIS — O24429 Gestational diabetes mellitus in childbirth, unspecified control: Secondary | ICD-10-CM

## 2015-03-09 DIAGNOSIS — Z3A39 39 weeks gestation of pregnancy: Secondary | ICD-10-CM

## 2015-03-09 LAB — GLUCOSE, CAPILLARY
Glucose-Capillary: 89 mg/dL (ref 65–99)
Glucose-Capillary: 87 mg/dL (ref 65–99)

## 2015-03-09 LAB — ABO/RH: ABO/RH(D): O POS

## 2015-03-09 MED ORDER — DIPHENHYDRAMINE HCL 50 MG/ML IJ SOLN: 12.5000 mg | INTRAMUSCULAR | Status: DC | PRN

## 2015-03-09 MED ORDER — LIDOCAINE HCL (PF) 1 % IJ SOLN
INTRAMUSCULAR | Status: DC | PRN
  Administered 2015-03-09 (×2): 4 mL

## 2015-03-09 MED ORDER — OXYCODONE-ACETAMINOPHEN 5-325 MG PO TABS
2.0000 | ORAL_TABLET | ORAL | Status: DC | PRN
Start: 2015-03-09 — End: 2015-03-12

## 2015-03-09 MED ORDER — DIPHENHYDRAMINE HCL 25 MG PO CAPS: 25.0000 mg | ORAL_CAPSULE | Freq: Four times a day (QID) | ORAL | Status: DC | PRN

## 2015-03-09 MED ORDER — IBUPROFEN 600 MG PO TABS
600.0000 mg | ORAL_TABLET | Freq: Four times a day (QID) | ORAL | Status: DC
  Administered 2015-03-09 – 2015-03-11 (×8): 600 mg via ORAL
  Filled 2015-03-09 (×8): qty 1

## 2015-03-09 MED ORDER — METFORMIN HCL 500 MG PO TABS
500.0000 mg | ORAL_TABLET | Freq: Two times a day (BID) | ORAL | Status: DC
  Administered 2015-03-10 – 2015-03-11 (×4): 500 mg via ORAL
  Filled 2015-03-09 (×5): qty 1

## 2015-03-09 MED ORDER — EPHEDRINE 5 MG/ML INJ
10.0000 mg | INTRAVENOUS | Status: DC | PRN
  Filled 2015-03-09: qty 2

## 2015-03-09 MED ORDER — ZOLPIDEM TARTRATE 5 MG PO TABS: 5.0000 mg | ORAL_TABLET | Freq: Every evening | ORAL | Status: DC | PRN

## 2015-03-09 MED ORDER — ACETAMINOPHEN 325 MG PO TABS: 650.0000 mg | ORAL_TABLET | ORAL | Status: DC | PRN

## 2015-03-09 MED ORDER — ONDANSETRON HCL 4 MG/2ML IJ SOLN: 4.0000 mg | INTRAMUSCULAR | Status: DC | PRN

## 2015-03-09 MED ORDER — PRENATAL MULTIVITAMIN CH
1.0000 | ORAL_TABLET | Freq: Every day | ORAL | Status: DC
  Administered 2015-03-10 – 2015-03-11 (×2): 1 via ORAL
  Filled 2015-03-09 (×2): qty 1

## 2015-03-09 MED ORDER — FENTANYL 2.5 MCG/ML BUPIVACAINE 1/10 % EPIDURAL INFUSION (WH - ANES): 13.0000 mL/h | INTRAMUSCULAR | Status: DC | PRN

## 2015-03-09 MED ORDER — PHENYLEPHRINE 40 MCG/ML (10ML) SYRINGE FOR IV PUSH (FOR BLOOD PRESSURE SUPPORT)
80.0000 ug | PREFILLED_SYRINGE | INTRAVENOUS | Status: DC | PRN
  Filled 2015-03-09: qty 2
  Filled 2015-03-09: qty 20

## 2015-03-09 MED ORDER — FENTANYL 2.5 MCG/ML BUPIVACAINE 1/10 % EPIDURAL INFUSION (WH - ANES)
14.0000 mL/h | INTRAMUSCULAR | Status: DC | PRN
  Administered 2015-03-09 (×2): 14 mL/h via EPIDURAL
  Filled 2015-03-09: qty 125

## 2015-03-09 MED ORDER — DIBUCAINE 1 % RE OINT: 1.0000 "application " | TOPICAL_OINTMENT | RECTAL | Status: DC | PRN

## 2015-03-09 MED ORDER — PHENYLEPHRINE 40 MCG/ML (10ML) SYRINGE FOR IV PUSH (FOR BLOOD PRESSURE SUPPORT): 80.0000 ug | PREFILLED_SYRINGE | INTRAVENOUS | Status: DC | PRN

## 2015-03-09 MED ORDER — SIMETHICONE 80 MG PO CHEW: 80.0000 mg | CHEWABLE_TABLET | ORAL | Status: DC | PRN

## 2015-03-09 MED ORDER — TETANUS-DIPHTH-ACELL PERTUSSIS 5-2.5-18.5 LF-MCG/0.5 IM SUSP: 0.5000 mL | Freq: Once | INTRAMUSCULAR | Status: DC

## 2015-03-09 MED ORDER — BENZOCAINE-MENTHOL 20-0.5 % EX AERO
1.0000 "application " | INHALATION_SPRAY | CUTANEOUS | Status: DC | PRN
  Administered 2015-03-10: 1 via TOPICAL
  Filled 2015-03-09: qty 56

## 2015-03-09 MED ORDER — ONDANSETRON HCL 4 MG PO TABS: 4.0000 mg | ORAL_TABLET | ORAL | Status: DC | PRN

## 2015-03-09 MED ORDER — LANOLIN HYDROUS EX OINT: TOPICAL_OINTMENT | CUTANEOUS | Status: DC | PRN

## 2015-03-09 MED ORDER — OXYCODONE-ACETAMINOPHEN 5-325 MG PO TABS
1.0000 | ORAL_TABLET | ORAL | Status: DC | PRN
  Administered 2015-03-10: 1 via ORAL
  Filled 2015-03-09: qty 1

## 2015-03-09 MED ORDER — SENNOSIDES-DOCUSATE SODIUM 8.6-50 MG PO TABS
2.0000 | ORAL_TABLET | ORAL | Status: DC
  Administered 2015-03-10 (×2): 2 via ORAL
  Filled 2015-03-09 (×2): qty 2

## 2015-03-09 MED ORDER — WITCH HAZEL-GLYCERIN EX PADS: 1.0000 "application " | MEDICATED_PAD | CUTANEOUS | Status: DC | PRN

## 2015-03-09 NOTE — Lactation Note (Addendum)
This note was copied from the chart of Jasmin Ellis. Lactation Consultation Note  Initial visit at 4 hours of age.  Baby latched now with assistance from [place].  Mom denies pain or concerns at this time.  Baby maintained feeding for about 5 minutes.   [facility] LC resources given and discussed.  Encouraged to feed with early cues on demand.  Early newborn behavior discussed.  Hand expression demonstrated by mom with colostrum visible.  Mom to call for assist as needed.    Patient Name: Jasmin Veronika Weimer GOVPC'[facility] Date: 03/09/2015 Reason for consult: Initial assessment   Maternal Data Has patient been taught Hand Expression?: Yes Does the patient have breastfeeding experience prior to this delivery?: Yes  Feeding Feeding Type: Breast Fed Length of feed: 10 min  LATCH Score/Interventions Latch: Grasps breast easily, tongue down, lips flanged, rhythmical sucking. Intervention(s): Skin to skin Intervention(s): Adjust position;Assist with latch;Breast massage;Breast compression  Audible Swallowing: A few with stimulation Intervention(s): Skin to skin;Hand expression Intervention(s): Skin to skin;Hand expression;Alternate breast massage  Type of Nipple: Everted at rest and after stimulation  Comfort (Breast/Nipple): Soft / non-tender     Hold (Positioning): Assistance needed to correctly position infant at breast and maintain latch. Intervention(s): Breastfeeding basics reviewed  LATCH Score: 8  Lactation Tools Discussed/Used     Consult Status Consult Status: Follow-up Date: 03/10/15 Follow-up type: In-patient    Shoptaw, Justine Null 03/09/2015, 8:55 PM

## 2015-03-09 NOTE — Progress Notes (Signed)
Labor Progress Note  Jasmin Ellis is a 24 y.o. G2P1001 at [redacted]w[redacted]d  admitted for IOL for A2GDM.  S: Not feeling pressure. Pain is well-controlled. No significant complaints/concerns.  O:  BP 127/75 mmHg  Pulse 77  Temp(Src) 97.6 F (36.4 C) (Oral)  Resp 18  Ht 5\' 3"  (1.6 m)  Wt 87.544 kg (193 lb)  BMI 34.20 kg/m2  SpO2 100%  LMP 05/29/2014     FHT:  FHR: 130 bpm, variability: moderate,  accelerations:  Present,  decelerations:  Absent UC:   regular, every 2-3 minutes SVE:   Dilation: 8.5 Effacement (%): 100 Station: -1 Exam by:: Dr. Darryl Nestle  SROM/AROM: AROM at 2:30PM Clear with some bloody fluid  Dilation: 8.5 Effacement (%): 100 Cervical Position: Middle Station: -1 Presentation: Vertex Exam by:: Dr. Darryl Nestle    Labs: Lab Results  Component Value Date   WBC 7.9 03/08/2015   HGB 10.9* 03/08/2015   HCT 31.9* 03/08/2015   MCV 84.2 03/08/2015   PLT 217 03/08/2015    Assessment / Plan: 24 y.o. G2P1001 [redacted]w[redacted]d in active labor. Progressing well s/p cytotecx2.  Induction of labor due to A2GDM,  progressing well on pitocin  Labor: Progressing s/p cytotecx2. AROM at 2:30PM Fetal Wellbeing:  Category I Pain Control:  Epidural Anticipated MOD:  NSVD

## 2015-03-09 NOTE — Anesthesia Preprocedure Evaluation (Signed)
Anesthesia Evaluation  Patient identified by MRN, date of birth, ID band Patient awake    Reviewed: Allergy & Precautions, H&P , Patient's Chart, lab work & pertinent test results  Airway Mallampati: III  TM Distance: >3 FB Neck ROM: full    Dental no notable dental hx. (+) Teeth Intact   Pulmonary neg pulmonary ROS,  breath sounds clear to auscultation  Pulmonary exam normal       Cardiovascular hypertension, negative cardio ROS Normal cardiovascular examRhythm:regular Rate:Normal     Neuro/Psych negative neurological ROS  negative psych ROS   GI/Hepatic negative GI ROS, Neg liver ROS,   Endo/Other  diabetes, Well Controlled, Type 2, Oral Hypoglycemic AgentsObesity  Renal/GU negative Renal ROS  negative genitourinary   Musculoskeletal   Abdominal   Peds  Hematology  (+) anemia ,   Anesthesia Other Findings   Reproductive/Obstetrics (+) Pregnancy                             Anesthesia Physical Anesthesia Plan  ASA: II  Anesthesia Plan: Epidural   Post-op Pain Management:    Induction:   Airway Management Planned:   Additional Equipment:   Intra-op Plan:   Post-operative Plan:   Informed Consent: I have reviewed the patients History and Physical, chart, labs and discussed the procedure including the risks, benefits and alternatives for the proposed anesthesia with the patient or authorized representative who has indicated his/her understanding and acceptance.     Plan Discussed with: Anesthesiologist  Anesthesia Plan Comments:         Anesthesia Quick Evaluation

## 2015-03-09 NOTE — Anesthesia Procedure Notes (Signed)
Epidural Patient location during procedure: OB Start time: 03/09/2015 8:41 AM  Staffing Anesthesiologist: Josephine Igo Performed by: anesthesiologist   Preanesthetic Checklist Completed: patient identified, site marked, surgical consent, pre-op evaluation, timeout performed, IV checked, risks and benefits discussed and monitors and equipment checked  Epidural Patient position: sitting Prep: site prepped and draped and DuraPrep Patient monitoring: continuous pulse ox and blood pressure Approach: midline Location: L3-L4 Injection technique: LOR air  Needle:  Needle type: Tuohy  Needle gauge: 17 G Needle length: 9 cm and 9 Needle insertion depth: 6 cm Catheter type: closed end flexible Catheter size: 19 Gauge Catheter at skin depth: 11 cm Test dose: negative and Other  Assessment Events: blood not aspirated, injection not painful, no injection resistance, negative IV test and no paresthesia  Additional Notes Patient identified. Risks and benefits discussed including failed block, incomplete  Pain control, post dural puncture headache, nerve damage, paralysis, blood pressure Changes, nausea, vomiting, reactions to medications-both toxic and allergic and post Partum back pain. All questions were answered. Patient expressed understanding and wished to proceed. Sterile technique was used throughout procedure. Epidural site was Dressed with sterile barrier dressing. No paresthesias, signs of intravascular injection Or signs of intrathecal spread were encountered.  Patient was more comfortable after the epidural was dosed. Please see RN's note for documentation of vital signs and FHR which are stable.\

## 2015-03-09 NOTE — Progress Notes (Signed)
Subjective: Resting comfortably with epidural.   Objective: BP 127/66 mmHg  Pulse 66  Temp(Src) 97.4 F (36.3 C) (Oral)  Resp 20  Ht 5\' 3"  (1.6 m)  Wt 193 lb (87.544 kg)  BMI 34.20 kg/m2  SpO2 100%  LMP 05/29/2014      FHT:  FHR: 140 bpm, variability: moderate,  accelerations:  Present,  decelerations:  Absent UC:  Regular q2-61min  SVE:   Dilation: 7 Effacement (%): 90 Station: -2 Exam by:: Dr. Gerarda Fraction  Labs: Lab Results  Component Value Date   WBC 7.9 03/08/2015   HGB 10.9* 03/08/2015   HCT 31.9* 03/08/2015   MCV 84.2 03/08/2015   PLT 217 03/08/2015    Assessment / Plan: Jasmin Ellis is a 24 y.o. G2P1001 at [redacted]w[redacted]d, admitted for IOL for GDM-A2. - last [place] 5/16 with EFW 2862g at 32%tile  Labor: Progressing well. Pit and Cytotec discontinued. May restart Pit if contractions become more spaced out and irregular.   -plan for AROM Fetal Wellbeing:  Category I Pain Control:  Epidural I/D:  GBS positive - PCN prophylaxis Anticipated MOD:  NSVD  Expectant management:   Jasmin Blare, DO 03/09/2015, 11:57 AM PGY-1, [facility]

## 2015-03-09 NOTE — Progress Notes (Signed)
Subjective:  Resting comfortably. Mild contractions. No other concerns.  Objective: BP 128/71 mmHg  Pulse 74  Temp(Src) 97.5 F (36.4 C) (Oral)  Resp 18  Ht 5\' 3"  (1.6 m)  Wt 87.544 kg (193 lb)  BMI 34.20 kg/m2  LMP 05/29/2014      FHT:  FHR: 130 bpm, variability: minimal ,  accelerations:  Present (15x15),  decelerations:  Absent UC:  Irregular, spaced out, q 5-1min SVE:   Dilation: 4 Effacement (%): 70 Station: -1, -2 Exam by:: Sinda Du, RN  Labs: Lab Results  Component Value Date   WBC 7.9 03/08/2015   HGB 10.9* 03/08/2015   HCT 31.9* 03/08/2015   MCV 84.2 03/08/2015   PLT 217 03/08/2015    Assessment / Plan: Jasmin Ellis is a 24 y.o. G2P1001 at [redacted]w[redacted]d, admitted for IOL for GDM-A2 - last [place] 5/16 with EFW 2862g at 32%tile  Labor: Gradual cervical change. Off Pit since 2000, switched to cytotec 50mcg PV q 4 hr overnight for continued ripening. Fetal Wellbeing:  Category I Pain Control:  Fentanyl PRN I/D:  GBS positive - PCN prophylaxis Anticipated MOD:  NSVD  Jasmin Ellis, [place], PGY-2  03/09/2015, 2:40 AM

## 2015-03-10 LAB — GLUCOSE, RANDOM: Glucose, Bld: 104 mg/dL — ABNORMAL HIGH (ref 65–99)

## 2015-03-10 NOTE — Anesthesia Postprocedure Evaluation (Signed)
  Anesthesia Post-op Note  Patient: Jasmin Ellis  Procedure(s) Performed: * No procedures listed *  Patient Location: Mother/Baby  Anesthesia Type:Epidural  Level of Consciousness: awake, alert , oriented and patient cooperative  Airway and Oxygen Therapy: Patient Spontanous Breathing  Post-op Pain: mild  Post-op Assessment: Patient's Cardiovascular Status Stable, Respiratory Function Stable, No signs of Nausea or vomiting, Pain level controlled, No headache, No backache, No residual numbness and No residual motor weakness  Post-op Vital Signs: stable  Last Vitals:  Filed Vitals:   03/10/15 0601  BP: 120/73  Pulse: 112  Temp: 36.6 C  Resp: 18    Complications: No apparent anesthesia complications

## 2015-03-10 NOTE — Progress Notes (Signed)
Post Partum Day 1  Subjective: Jasmin Ellis is a 24 y.o. [id number] [redacted]w[redacted]d s/p NSVD.  No acute events overnight.  Pt denies problems with ambulating, voiding or po intake.  She denies nausea or vomiting.  Pain is moderately controlled.  She has had flatus. She has had bowel movement.  Lochia Moderate.  Plan for birth control is no method.  Method of Feeding: breast.  No concerns this morning.   Objective: BP 120/73 mmHg  Pulse 112  Temp(Src) 97.9 F (36.6 C) (Oral)  Resp 18  Ht 5\' 3"  (1.6 m)  Wt 193 lb (87.544 kg)  BMI 34.20 kg/m2  SpO2 100%  LMP 05/29/2014  Breastfeeding? Unknown  Physical Exam:  General: alert, cooperative and no distress Lochia:normal flow Chest: CTAB Heart: RRR no m/r/g Abdomen: +BS, soft, nontender, fundus firm above umbilicus DVT Evaluation: No evidence of DVT seen on physical exam. Extremities: no edema   Recent Labs  03/08/15 0850  HGB 10.9*  HCT 31.9*    Assessment/Plan:  ASSESSMENT: Jasmin Ellis is a 24 y.o. G2P2002 [redacted]w[redacted]d ppd #1 s/p NSVD doing well.   Plan for discharge tomorrow and Breastfeeding   LOS: 2 days   Luiz Blare, DO 03/10/2015, 7:56 AM PGY-1, [place]  OB fellow attestation Post Partum Day 1 I have seen and examined this patient and agree with above documentation in the resident's note.   Jasmin Ellis is a 24 y.o. [id number] s/p NSVD.  Pt denies problems with ambulating, voiding or po intake. Pain is well controlled.  Plan for birth control is natural family planning (NFP).  Method of Feeding: breast and formula  PE:  BP 127/70 mmHg  Pulse 99  Temp(Src) 98.1 F (36.7 C) (Oral)  Resp 18  Ht 5\' 3"  (1.6 m)  Wt 193 lb (87.544 kg)  BMI 34.20 kg/m2  SpO2 100%  LMP 05/29/2014  Breastfeeding? Unknown Fundus firm  Plan for discharge: PPD2  [name], MD 9:16 AM

## 2015-03-11 MED ORDER — IBUPROFEN 600 MG PO TABS: 600.0000 mg | ORAL_TABLET | Freq: Four times a day (QID) | ORAL | Status: DC

## 2015-03-11 MED ORDER — DOCUSATE SODIUM 100 MG PO CAPS: 100.0000 mg | ORAL_CAPSULE | Freq: Two times a day (BID) | ORAL | Status: DC

## 2015-03-11 NOTE — Discharge Instructions (Signed)

## 2015-03-11 NOTE — Discharge Summary (Signed)
Obstetric Discharge Summary Reason for Admission: induction of labor for A2GDM Prenatal Procedures: ultrasound Intrapartum Procedures: spontaneous vaginal delivery Postpartum Procedures: none Complications-Operative and Postpartum: 2nd degree perineal laceration, s/p repair HEMOGLOBIN  Date Value Ref Range Status  03/08/2015 10.9* 12.0 - 15.0 g/dL Final   HCT  Date Value Ref Range Status  03/08/2015 31.9* 36.0 - 46.0 % Final    Discharge Diagnoses: Term Pregnancy-delivered, h/o A2GDM  Hospital Course:  Jasmin Ellis is a 24 y.o. G2P2002 at [redacted]w[redacted]d who was admitted on 03/08/15 for IOL [id number], complicating pregnancy. Maternal GBS positive, received adequate PCN prophylaxis. Progressed to vaginal delivery within 31 hours (see copied delivery note below). Postpartum course was uncomplicated, tolerating PO and ambulation, pain controlled, bleeding improved, +flatus/BM, and no barriers to discharge. Plan for continue breast feeding, contraception with natural family planning, follow-up in 4-6 weeks for postpartum visit.  Delivery Note At 4:17 PM a viable female was delivered via Vaginal, Spontaneous Delivery (Presentation: ROA). APGAR: 9, 9; weight pending.  Placenta status: Intact, Spontaneous. Cord: 3 vessels with the following complications: None. Cord pH: n/a  Anesthesia: Epidural  Episiotomy: None Lacerations: 2nd degree Suture Repair: 2.0 vicryl rapide Est. Blood Loss (mL): 150  Mom to postpartum. Baby to Couplet care / Skin to Skin.  [name] 03/09/2015, 4:51 PM  -------------------------- Physical Exam:  General: alert and cooperative, well-appearing, NAD Lochia: appropriate Uterine Fundus: firm DVT Evaluation: No evidence of DVT seen on physical exam. Negative Homan's sign. No cords or calf tenderness. No significant calf/ankle edema.  Discharge Information: Date: 03/11/2015 Activity: pelvic rest Diet: routine Medications: PNV, Ibuprofen and Colace Baby  feeding: plans to breastfeed Contraception: natural family planning (NFP) Condition: stable Instructions: refer to practice specific booklet Discharge to: home   Newborn Data: Live born female  Birth Weight: 6 lb 10 oz (3005 g) APGAR: 9, 9  Anticipated discharge home with mother.  [name], [place], PGY-2 03/11/2015, 7:27 AM  Seen also by me Wants circumcision, has [place] with note [name], CNM

## 2015-03-11 NOTE — Lactation Note (Signed)
This note was copied from the chart of Jasmin Janissa [place]. Lactation Consultation Note: infant is 41 hours. Mother has been supplementing with formula as well as breastfeeding ..infant was given 20 ml of formula with a bottle 5 hours ago. Advised mother to wake infant . Mothers breast are full . Assist mother with hand expressing a small amt. Infant latched well with good depth. Observed feeding for 15 mins. Mother taught breast compression. Mother has a hand pump and ask about giving bottles and father states that he prefers her to breastfeed since she has so much milk. Advised to continue to breastfeed 8-12 times daily. Mother is aware of available lactation services and community support.   Patient Name: Jasmin Ellis EBRAX'E Date: 03/11/2015 Reason for consult: Follow-up assessment   Maternal Data    Feeding Feeding Type: Breast Fed Length of feed: 15 min  LATCH Score/Interventions Latch: Grasps breast easily, tongue down, lips flanged, rhythmical sucking. Intervention(s): Skin to skin;Waking techniques Intervention(s): Adjust position;Assist with latch;Breast compression  Audible Swallowing: Spontaneous and intermittent Intervention(s): Hand expression  Type of Nipple: Everted at rest and after stimulation  Comfort (Breast/Nipple): Filling, red/small blisters or bruises, mild/mod discomfort  Problem noted: Filling Interventions (Filling): Hand pump Interventions (Mild/moderate discomfort): Hand massage;Hand expression  Hold (Positioning): Assistance needed to correctly position infant at breast and maintain latch. Intervention(s): Position options  LATCH Score: 8  Lactation Tools Discussed/Used     Consult Status Consult Status: Complete    Darla Lesches 03/11/2015, 11:30 AM

## 2015-04-08 ENCOUNTER — Encounter: Payer: Self-pay | Admitting: Obstetrics and Gynecology

## 2015-04-08 ENCOUNTER — Ambulatory Visit (INDEPENDENT_AMBULATORY_CARE_PROVIDER_SITE_OTHER): Payer: 59 | Admitting: Obstetrics and Gynecology

## 2015-04-08 NOTE — Progress Notes (Signed)
  Subjective:     Jasmin Ellis is a 25 y.o. female who presents for a postpartum visit. She is 4 weeks postpartum following a spontaneous vaginal delivery. I have fully reviewed the prenatal and intrapartum course. The delivery was at 13 gestational weeks. Outcome: spontaneous vaginal delivery. Anesthesia: epidural. Postpartum course has been uncomplicated. Baby's course has been uncomplicated. Baby is feeding by breast. Bleeding thin lochia. Bowel function is normal. Bladder function is normal. Patient is not sexually active. Contraception method is none. Postpartum depression screening: negative.     Review of Systems A comprehensive review of systems was negative.   Objective:    BP 123/81 mmHg  Pulse 87  Temp(Src) 97.7 F (36.5 C) (Oral)  Wt 171 lb 4.8 oz (77.701 kg)  Breastfeeding? Yes  General:  alert, cooperative and no distress   Breasts:  inspection negative, no nipple discharge or bleeding, no masses or nodularity palpable  Lungs: clear to auscultation bilaterally  Heart:  regular rate and rhythm  Abdomen: soft, non-tender; bowel sounds normal; no masses,  no organomegaly   Vulva:  normal  Vagina: normal vagina, no discharge, exudate, lesion, or erythema  Cervix:  multiparous appearance  Corpus: normal size, contour, position, consistency, mobility, non-tender  Adnexa:  normal adnexa and no mass, fullness, tenderness  Rectal Exam: Not performed.        Assessment:     Normal postpartum exam. Pap smear not done at today's visit.   Plan:    1. Contraception: none 2. Patient is medically cleared to resume all activities of daily living.  3. Follow up in: 1 year for annual exam or as needed.   4. Follow up with PCP for diabetic management

## 2015-05-27 ENCOUNTER — Ambulatory Visit (INDEPENDENT_AMBULATORY_CARE_PROVIDER_SITE_OTHER): Payer: 59 | Admitting: Obstetrics & Gynecology

## 2015-05-27 ENCOUNTER — Encounter: Payer: Self-pay | Admitting: Obstetrics & Gynecology

## 2015-05-27 VITALS — BP 148/79 | HR 75 | Temp 98.3°F | Ht 64.0 in | Wt 172.8 lb

## 2015-05-27 DIAGNOSIS — N938 Other specified abnormal uterine and vaginal bleeding: Secondary | ICD-10-CM

## 2015-05-27 MED ORDER — NORETHINDRONE 0.35 MG PO TABS: 1.0000 | ORAL_TABLET | Freq: Every day | ORAL | Status: DC

## 2015-05-27 NOTE — Progress Notes (Signed)
   Subjective:    Patient ID: Jasmin Ellis, female    DOB: March 25, 1991, 24 y.o.   MRN: [id number]  HPI  24 yo AA P2 now 9 weeks pp s/p NSVD is here with the complaint of irregular bleeding since delivery. She says that she can skip 2-3 days but then the bleeding will restart. She is using condoms for contraception.  Review of Systems Pap normal and UTD    Objective:   Physical Exam  WNWHBFNAD Breathing, conversing, and ambulating normally Abd- benign EG, vagina, and cervix all normal. Bimanual NSSA, NT, mobile, no adnexal masses No blood visible     Assessment & Plan:  Complaint of irregular bleeding. I gave her reassurance that she was normal. She did not want to hear this and requests medication. I agreed to give her POPs.

## 2015-05-27 NOTE — Progress Notes (Signed)
Not bleeding today last bleeding was three days ago.  Does not c/o heavy bleeding.

## 2015-07-06 ENCOUNTER — Emergency Department (HOSPITAL_COMMUNITY)
Admission: EM | Admit: 2015-07-06 | Discharge: 2015-07-06 | Disposition: A | Discharge status: Home / Self Care | Payer: 59 | Attending: Emergency Medicine | Admitting: Emergency Medicine

## 2015-07-06 ENCOUNTER — Telehealth: Payer: Self-pay | Admitting: *Deleted

## 2015-07-06 ENCOUNTER — Encounter (HOSPITAL_COMMUNITY): Payer: Self-pay | Admitting: Emergency Medicine

## 2015-07-06 DIAGNOSIS — H0011 Chalazion right upper eyelid: Secondary | ICD-10-CM

## 2015-07-06 DIAGNOSIS — R220 Localized swelling, mass and lump, head: Secondary | ICD-10-CM | POA: Diagnosis present

## 2015-07-06 DIAGNOSIS — E119 Type 2 diabetes mellitus without complications: Secondary | ICD-10-CM | POA: Diagnosis not present

## 2015-07-06 DIAGNOSIS — Z79899 Other long term (current) drug therapy: Secondary | ICD-10-CM | POA: Diagnosis not present

## 2015-07-06 DIAGNOSIS — Z86018 Personal history of other benign neoplasm: Secondary | ICD-10-CM | POA: Insufficient documentation

## 2015-07-06 DIAGNOSIS — G8929 Other chronic pain: Secondary | ICD-10-CM | POA: Insufficient documentation

## 2015-07-06 MED ORDER — ERYTHROMYCIN 2 % EX OINT: TOPICAL_OINTMENT | CUTANEOUS | Status: DC

## 2015-07-06 NOTE — ED Provider Notes (Signed)
CSN: [id number]     Arrival date & time 07/06/15  0745 History   First MD Initiated Contact with Patient 07/06/15 0840     Chief Complaint  Patient presents with  . Facial Swelling     (Consider location/radiation/quality/duration/timing/severity/associated sxs/prior Treatment) HPI   PCP: [facility] PMH: diabetes, back pain, fibroids  Jasmin Ellis is a 24 y.o.  female  CHIEF COMPLAINT: eye swelling to the right  Time of onset: 1 week ago        Quality:  Sharp pains to eyelid        Severity:  mild        Progression:  worsening Chronicity: acute, has had this once before when she lived in [place] Risk factors: diabetes Treatments tried:  none Relieved by: None tried Worsened by: touching Associated Symptoms:  None  ROS: The patient denies diaphoresis, fever, headache, weakness (general or focal), confusion, change of vision,  dysphagia, aphagia, shortness of breath,  abdominal pains, nausea, vomiting, diarrhea, lower extremity swelling, rash, neck pain, chest pain   Past Medical History  Diagnosis Date  . Back pain, chronic     s/p fall 8 years ago  . Fibroids   . Diabetes mellitus without complication    History reviewed. No pertinent past surgical history. History reviewed. No pertinent family history. Social History  Substance Use Topics  . Smoking status: Never Smoker   . Smokeless tobacco: Never Used  . Alcohol Use: No   OB History    Gravida Para Term Preterm AB TAB SAB Ectopic Multiple Living   2 2 2  0 0 0 0 0 0 2     Review of Systems  10 Systems reviewed and are negative for acute change except as noted in the HPI.     Allergies  Review of patient's allergies indicates no known allergies.  Home Medications   Prior to Admission medications   Medication Sig Start Date End Date Taking? Authorizing Provider  acetaminophen (TYLENOL) 325 MG tablet Take 650 mg by mouth every 6 (six) hours as needed for pain or fever.    Historical  Provider, MD  docusate sodium (COLACE) 100 MG capsule Take 1 capsule (100 mg total) by mouth 2 (two) times daily. Patient not taking: Reported on 05/27/2015 03/11/15   Olin Hauser, DO  Erythromycin 2 % ointment Apply to affected area 2 times daily 07/06/15   Delos Haring, PA-C  glucose blood (ONE TOUCH ULTRA TEST) test strip Use as instructed 12/22/14   [name], MD  glucose blood test strip Use as instructed 08/11/14   Tanna Savoy Stinson, DO  ibuprofen (ADVIL,MOTRIN) 600 MG tablet Take 1 tablet (600 mg total) by mouth every 6 (six) hours. Patient not taking: Reported on 05/27/2015 03/11/15   Olin Hauser, DO  Lancets [facility] ULTRASOFT) lancets Use as instructed 12/22/14   [name], MD  metFORMIN (GLUCOPHAGE) 500 MG tablet Take by mouth 2 (two) times daily with a meal.    Historical Provider, MD  norethindrone (MICRONOR,CAMILA,ERRIN) 0.35 MG tablet Take 1 tablet (0.35 mg total) by mouth daily. 05/27/15   Emily Filbert, MD  ONETOUCH DELICA LANCETS 14H MISC 1 Units by Does not apply route 4 (four) times daily. 08/11/14   Truett Mainland, DO  Prenatal Vit-DSS-Fe Cbn-FA (OBSTETRIX EC) 29-1 MG TABS Take 1 tablet by mouth 1 day or 1 dose. 07/29/14   Deirdre C Poe, CNM   BP 136/73 mmHg  Pulse 77  Temp(Src) 97.5  F (36.4 C) (Oral)  Resp 18  SpO2 100% Physical Exam  Constitutional: She appears well-developed and well-nourished. No distress.  HENT:  Head: Normocephalic and atraumatic.  Eyes: Conjunctivae and EOM are normal. Pupils are equal, round, and reactive to light.  Small pustule noted underneath right eyelid. Consistent with Chalazion. Mild swelling to the eyelid. No periorbital swelling or tenderness. No globe tenderness.  Neck: Normal range of motion. Neck supple.  Cardiovascular: Normal rate and regular rhythm.   Pulmonary/Chest: Effort normal.  Abdominal: Soft.  Neurological: She is alert.  Skin: Skin is warm and dry.  Nursing note and vitals  reviewed.   ED Course  Procedures (including critical care time) Labs Review Labs Reviewed - No data to display  Imaging Review No results found. I have personally reviewed and evaluated these images and lab results as part of my medical decision-making.   EKG Interpretation None      MDM   Final diagnoses:  Chalazion of right upper eyelid    Rx: erythromycin and warm moist compresses. Referral to eye doctor Normal vitals, denies change in vision, dysuria, polyuria, increase thirst or fevers.  Medications - No data to display  24 y.o.Kella Kardell's evaluation in the Emergency Department is complete. It has been determined that no acute conditions requiring further emergency intervention are present at this time. The patient/guardian have been advised of the diagnosis and plan. We have discussed signs and symptoms that warrant return to the ED, such as changes or worsening in symptoms.  Vital signs are stable at discharge. Filed Vitals:   07/06/15 0801  BP: 136/73  Pulse: 77  Temp: 97.5 F (36.4 C)  Resp: 18    Patient/guardian has voiced understanding and agreed to follow-up with the PCP or specialist.    Delos Haring, PA-C 07/06/15 [place], MD 07/06/15 2328

## 2015-07-06 NOTE — ED Notes (Signed)
PT ambulated with baseline gait; VSS; A&Ox3; no signs of distress; respirations even and unlabored; skin warm and dry; no questions upon discharge.  

## 2015-07-06 NOTE — Discharge Instructions (Signed)
Chalazion A chalazion is a swelling or hard lump on the eyelid caused by a blocked oil gland. Chalazions may occur on the upper or the lower eyelid.  CAUSES  Oil gland in the eyelid becomes blocked. SYMPTOMS   Swelling or hard lump on the eyelid. This lump may make it hard to see out of the eye.  The swelling may spread to areas around the eye. TREATMENT   Although some chalazions disappear by themselves in 1 or 2 months, some chalazions may need to be removed.  Medicines to treat an infection may be required. HOME CARE INSTRUCTIONS   Wash your hands often and dry them with a clean towel. Do not touch the chalazion.  Apply heat to the eyelid several times a day for 10 minutes to help ease discomfort and bring any yellowish white fluid (pus) to the surface. One way to apply heat to a chalazion is to use the handle of a metal spoon.  Hold the handle under hot water until it is hot, and then wrap the handle in paper towels so that the heat can come through without burning your skin.  Hold the wrapped handle against the chalazion and reheat the spoon handle as needed.  Apply heat in this fashion for 10 minutes, 4 times per day.  Return to your caregiver to have the pus removed if it does not break (rupture) on its own.  Do not try to remove the pus yourself by squeezing the chalazion or sticking it with a pin or needle.  Only take over-the-counter or prescription medicines for pain, discomfort, or fever as directed by your caregiver. SEEK IMMEDIATE MEDICAL CARE IF:   You have pain in your eye.  Your vision changes.  The chalazion does not go away.  The chalazion becomes painful, red, or swollen, grows larger, or does not start to disappear after 2 weeks. MAKE SURE YOU:   Understand these instructions.  Will watch your condition.  Will get help right away if you are not doing well or get worse. Document Released: 09/30/2000 Document Revised: 12/26/2011 Document Reviewed:  01/18/2010 ExitCare Patient Information 2015 [employer]. This information is not intended to replace advice given to you by your health care provider. Make sure you discuss any questions you have with your health care provider.  

## 2015-07-06 NOTE — Telephone Encounter (Signed)
Pharmacy called related to VP:[id number] 2 % ointment. Dispense 1 gram; pharmacy does not carry that dose- it has to be made....NCM clarified with EDP to change Rx to: Erythromycin 0.5% which is in stock.

## 2015-07-06 NOTE — ED Notes (Addendum)
Pt's R eye swollen for 1 week; no drainage noted. No medical problems. No issues with vision or complaints.

## 2015-08-11 ENCOUNTER — Inpatient Hospital Stay (HOSPITAL_COMMUNITY)
Admission: AD | Admit: 2015-08-11 | Discharge: 2015-08-11 | Disposition: A | Discharge status: Home / Self Care | Payer: 59 | Source: Ambulatory Visit | Attending: Obstetrics & Gynecology | Admitting: Obstetrics & Gynecology

## 2015-08-11 ENCOUNTER — Encounter (HOSPITAL_COMMUNITY): Payer: Self-pay | Admitting: *Deleted

## 2015-08-11 DIAGNOSIS — B9689 Other specified bacterial agents as the cause of diseases classified elsewhere: Secondary | ICD-10-CM

## 2015-08-11 DIAGNOSIS — N760 Acute vaginitis: Secondary | ICD-10-CM | POA: Insufficient documentation

## 2015-08-11 DIAGNOSIS — G8929 Other chronic pain: Secondary | ICD-10-CM | POA: Insufficient documentation

## 2015-08-11 DIAGNOSIS — Z7984 Long term (current) use of oral hypoglycemic drugs: Secondary | ICD-10-CM | POA: Insufficient documentation

## 2015-08-11 DIAGNOSIS — A499 Bacterial infection, unspecified: Secondary | ICD-10-CM | POA: Diagnosis not present

## 2015-08-11 DIAGNOSIS — E119 Type 2 diabetes mellitus without complications: Secondary | ICD-10-CM | POA: Diagnosis not present

## 2015-08-11 LAB — URINALYSIS, ROUTINE W REFLEX MICROSCOPIC
Bilirubin Urine: NEGATIVE
GLUCOSE, UA: NEGATIVE mg/dL
Hgb urine dipstick: NEGATIVE
Ketones, ur: NEGATIVE mg/dL
LEUKOCYTES UA: NEGATIVE
Nitrite: NEGATIVE
PH: 7.5 (ref 5.0–8.0)
Protein, ur: NEGATIVE mg/dL
SPECIFIC GRAVITY, URINE: 1.02 (ref 1.005–1.030)
Urobilinogen, UA: 0.2 mg/dL (ref 0.0–1.0)

## 2015-08-11 LAB — POCT PREGNANCY, URINE: Preg Test, Ur: NEGATIVE

## 2015-08-11 LAB — WET PREP, GENITAL
CLUE CELLS WET PREP: NONE SEEN
Trich, Wet Prep: NONE SEEN
Yeast Wet Prep HPF POC: NONE SEEN

## 2015-08-11 MED ORDER — METRONIDAZOLE 0.75 % VA GEL
1.0000 | Freq: Every day | VAGINAL | Status: DC
Start: 2015-08-11 — End: 2015-10-06

## 2015-08-11 MED ORDER — METRONIDAZOLE 500 MG PO TABS: 500.0000 mg | ORAL_TABLET | Freq: Two times a day (BID) | ORAL | Status: DC

## 2015-08-11 NOTE — Discharge Instructions (Signed)

## 2015-08-11 NOTE — MAU Note (Signed)
Large amt of watery d/c, bad odor, first noted 2 wks ago.  Some pain in lower abd

## 2015-08-11 NOTE — MAU Provider Note (Signed)
Chief Complaint: Vaginal Discharge   None     SUBJECTIVE HPI: Jasmin Ellis is a 24 y.o. G2P2002 who presents to maternity admissions reporting onset if large amount thin malodorous discharge this week. She reports she was diagnosed with BV a long time ago and thinks her symptoms are similar.  She is breastfeeding her 58 month old and uses condoms for contraception. She is not having periods r/t breastfeeding.. She denies vaginal bleeding, vaginal itching/burning, urinary symptoms, h/a, dizziness, n/v, or fever/chills.     Vaginal Discharge The patient's primary symptoms include vaginal discharge. The patient's pertinent negatives include no pelvic pain or vaginal bleeding. This is a recurrent problem. The current episode started in the past 7 days. The problem occurs constantly. The problem has been unchanged. The patient is experiencing no pain. She is not pregnant. Pertinent negatives include no abdominal pain, chills, constipation, diarrhea, dysuria, fever, flank pain, frequency, headaches, nausea, urgency or vomiting. The vaginal discharge was watery, thin and malodorous. There has been no bleeding. She has not been passing clots. She has not been passing tissue. Nothing aggravates the symptoms. She has tried nothing for the symptoms. The treatment provided no relief. She is sexually active. No, her partner does not have an STD. She uses condoms for contraception.    Past Medical History  Diagnosis Date  . Back pain, chronic     s/p fall 8 years ago  . Fibroids   . Diabetes mellitus without complication [facility])    Past Surgical History  Procedure Laterality Date  . No past surgeries     Social History   Social History  . Marital Status: Married    Spouse Name: N/A  . Number of Children: N/A  . Years of Education: N/A   Occupational History  . Not on file.   Social History Main Topics  . Smoking status: Never Smoker   . Smokeless tobacco: Never Used  . Alcohol Use: No  .  Drug Use: No  . Sexual Activity: Yes    Birth Control/ Protection: Condom   Other Topics Concern  . Not on file   Social History Narrative   No current facility-administered medications on file prior to encounter.   Current Outpatient Prescriptions on File Prior to Encounter  Medication Sig Dispense Refill  . metFORMIN (GLUCOPHAGE) 500 MG tablet Take 500 mg by mouth 2 (two) times daily with a meal.     . Prenatal Vit-DSS-Fe Cbn-FA (OBSTETRIX EC) 29-1 MG TABS Take 1 tablet by mouth 1 day or 1 dose. 30 each 0  . glucose blood (ONE TOUCH ULTRA TEST) test strip Use as instructed 100 each 12  . glucose blood test strip Use as instructed 100 each 12  . Lancets (ONETOUCH ULTRASOFT) lancets Use as instructed 100 each 12  . ONETOUCH DELICA LANCETS 79G MISC 1 Units by Does not apply route 4 (four) times daily. 100 each 12   No Known Allergies  ROS:  Review of Systems  Constitutional: Negative for fever, chills and fatigue.  HENT: Negative for sinus pressure.   Eyes: Negative for photophobia.  Respiratory: Negative for shortness of breath.   Cardiovascular: Negative for chest pain.  Gastrointestinal: Negative for nausea, vomiting, abdominal pain, diarrhea and constipation.  Genitourinary: Positive for vaginal discharge. Negative for dysuria, urgency, frequency, flank pain, vaginal bleeding, difficulty urinating, vaginal pain and pelvic pain.  Musculoskeletal: Negative for neck pain.  Neurological: Negative for dizziness, weakness and headaches.  Psychiatric/Behavioral: Negative.      I  have reviewed patient's Past Medical Hx, Surgical Hx, Family Hx, Social Hx, medications and allergies.   Physical Exam   Patient Vitals for the past 24 hrs:  BP Temp Temp src Pulse Resp Weight  08/11/15 1217 151/78 mmHg 97.9 F (36.6 C) - 70 18 -  08/11/15 1019 137/71 mmHg 97.6 F (36.4 C) Oral 83 18 81.103 kg (178 lb 12.8 oz)   Constitutional: Well-developed, well-nourished female in no acute  distress.  Cardiovascular: normal rate Respiratory: normal effort GI: Abd soft, non-tender. Pos BS x 4 MS: Extremities nontender, no edema, normal ROM Neurologic: Alert and oriented x 4.  GU: Neg CVAT.  PELVIC EXAM: Cervix pink, visually closed, without lesion, moderate amount thin white discharge with light odor, vaginal walls and external genitalia normal Bimanual exam: Cervix 0/long/high, firm, anterior, neg CMT, uterus nontender, nonenlarged, adnexa without tenderness, enlargement, or mass   LAB RESULTS Results for orders placed or performed during the hospital encounter of 08/11/15 (from the past 24 hour(s))  Urinalysis, Routine w reflex microscopic (not at [facility])     Status: None   Collection Time: 08/11/15 10:25 AM  Result Value Ref Range   Color, Urine YELLOW YELLOW   APPearance CLEAR CLEAR   Specific Gravity, Urine 1.020 1.005 - 1.030   pH 7.5 5.0 - 8.0   Glucose, UA NEGATIVE NEGATIVE mg/dL   Hgb urine dipstick NEGATIVE NEGATIVE   Bilirubin Urine NEGATIVE NEGATIVE   Ketones, ur NEGATIVE NEGATIVE mg/dL   Protein, ur NEGATIVE NEGATIVE mg/dL   Urobilinogen, UA 0.2 0.0 - 1.0 mg/dL   Nitrite NEGATIVE NEGATIVE   Leukocytes, UA NEGATIVE NEGATIVE  Pregnancy, urine POC     Status: None   Collection Time: 08/11/15 10:25 AM  Result Value Ref Range   Preg Test, Ur NEGATIVE NEGATIVE  Wet prep, genital     Status: Abnormal   Collection Time: 08/11/15 11:15 AM  Result Value Ref Range   Yeast Wet Prep HPF POC NONE SEEN NONE SEEN   Trich, Wet Prep NONE SEEN NONE SEEN   Clue Cells Wet Prep HPF POC NONE SEEN NONE SEEN   WBC, Wet Prep HPF POC FEW (A) NONE SEEN    --/--/O POS, O POS (05/22 0850)  IMAGING No results found.  MAU Management/MDM: Ordered labs and reviewed results.  Wet prep negative but clinical exam and symptoms indicate BV.   Discussed Rx for Flagyl or Metrogel with pt. Sent both to pharmacy, pt to try topical medication and if symptoms persist, oral medication x 1  week.  Pt stable at time of discharge.  ASSESSMENT 1. BV (bacterial vaginosis)     PLAN Discharge home Metrogel Q HS x 5 nights Flagyl 500 mg PO BID x 7 days Reviewed ways to prevent vaginal infections including wearing cotton breathable underwear, unscented soaps and no perfumes in vaginal area, and use of oral probiotics daily including yogurt.  F/U in [place] for routine Gyn care    Medication List    TAKE these medications        glucose blood test strip  Use as instructed     glucose blood test strip  Commonly known as:  ONE TOUCH ULTRA TEST  Use as instructed     metFORMIN 500 MG tablet  Commonly known as:  GLUCOPHAGE  Take 500 mg by mouth 2 (two) times daily with a meal.     metroNIDAZOLE 0.75 % vaginal gel  Commonly known as:  METROGEL  Place 1 Applicatorful vaginally at bedtime.  Apply one applicatorful to vagina at bedtime for 5 days     metroNIDAZOLE 500 MG tablet  Commonly known as:  FLAGYL  Take 1 tablet (500 mg total) by mouth 2 (two) times daily.     OBSTETRIX EC 29-1 MG Tabs  Take 1 tablet by mouth 1 day or 1 dose.     ONETOUCH DELICA LANCETS 87N Misc  1 Units by Does not apply route 4 (four) times daily.     onetouch ultrasoft lancets  Use as instructed       Follow-up Information    Follow up with [place].   Why:  As needed for emergencies   Contact information:   [address] [id number] [place] Certified Nurse-Midwife 08/11/2015  4:19 PM

## 2015-08-11 NOTE — Progress Notes (Signed)
Jasmin Ellis CNM discussed plan of care with pt earlier. Written and verbal d/c instructions given and understanding voiced.

## 2015-08-12 LAB — GC/CHLAMYDIA PROBE AMP (~~LOC~~) NOT AT ARMC
CHLAMYDIA, DNA PROBE: NEGATIVE
NEISSERIA GONORRHEA: NEGATIVE

## 2015-10-06 ENCOUNTER — Emergency Department (HOSPITAL_COMMUNITY)
Admission: EM | Admit: 2015-10-06 | Discharge: 2015-10-06 | Disposition: A | Discharge status: Home / Self Care | Payer: 59 | Attending: Emergency Medicine | Admitting: Emergency Medicine

## 2015-10-06 ENCOUNTER — Emergency Department (HOSPITAL_COMMUNITY): Payer: 59

## 2015-10-06 ENCOUNTER — Encounter (HOSPITAL_COMMUNITY): Payer: Self-pay | Admitting: Emergency Medicine

## 2015-10-06 DIAGNOSIS — R52 Pain, unspecified: Secondary | ICD-10-CM | POA: Diagnosis present

## 2015-10-06 DIAGNOSIS — G8929 Other chronic pain: Secondary | ICD-10-CM | POA: Diagnosis not present

## 2015-10-06 DIAGNOSIS — J069 Acute upper respiratory infection, unspecified: Secondary | ICD-10-CM | POA: Insufficient documentation

## 2015-10-06 DIAGNOSIS — M791 Myalgia, unspecified site: Secondary | ICD-10-CM

## 2015-10-06 DIAGNOSIS — Z86018 Personal history of other benign neoplasm: Secondary | ICD-10-CM | POA: Diagnosis not present

## 2015-10-06 DIAGNOSIS — E119 Type 2 diabetes mellitus without complications: Secondary | ICD-10-CM | POA: Insufficient documentation

## 2015-10-06 DIAGNOSIS — Z79899 Other long term (current) drug therapy: Secondary | ICD-10-CM | POA: Diagnosis not present

## 2015-10-06 DIAGNOSIS — Z3202 Encounter for pregnancy test, result negative: Secondary | ICD-10-CM | POA: Insufficient documentation

## 2015-10-06 LAB — URINE MICROSCOPIC-ADD ON

## 2015-10-06 LAB — URINALYSIS, ROUTINE W REFLEX MICROSCOPIC
BILIRUBIN URINE: NEGATIVE
Glucose, UA: NEGATIVE mg/dL
HGB URINE DIPSTICK: NEGATIVE
Ketones, ur: NEGATIVE mg/dL
NITRITE: NEGATIVE
Protein, ur: NEGATIVE mg/dL
SPECIFIC GRAVITY, URINE: 1.019 (ref 1.005–1.030)
pH: 7 (ref 5.0–8.0)

## 2015-10-06 LAB — CBC WITH DIFFERENTIAL/PLATELET
BASOS ABS: 0 10*3/uL (ref 0.0–0.1)
BASOS PCT: 1 %
Eosinophils Absolute: 0.5 10*3/uL (ref 0.0–0.7)
Eosinophils Relative: 7 %
HEMATOCRIT: 34.3 % — AB (ref 36.0–46.0)
Hemoglobin: 11.8 g/dL — ABNORMAL LOW (ref 12.0–15.0)
LYMPHS PCT: 47 %
Lymphs Abs: 3.7 10*3/uL (ref 0.7–4.0)
MCH: 29.6 pg (ref 26.0–34.0)
MCHC: 34.4 g/dL (ref 30.0–36.0)
MCV: 86.2 fL (ref 78.0–100.0)
Monocytes Absolute: 0.3 10*3/uL (ref 0.1–1.0)
Monocytes Relative: 4 %
NEUTROS ABS: 3.2 10*3/uL (ref 1.7–7.7)
NEUTROS PCT: 41 %
Platelets: 321 10*3/uL (ref 150–400)
RBC: 3.98 MIL/uL (ref 3.87–5.11)
RDW: 12.4 % (ref 11.5–15.5)
WBC: 7.8 10*3/uL (ref 4.0–10.5)

## 2015-10-06 LAB — BASIC METABOLIC PANEL
ANION GAP: 6 (ref 5–15)
BUN: 7 mg/dL (ref 6–20)
CALCIUM: 9.3 mg/dL (ref 8.9–10.3)
CHLORIDE: 105 mmol/L (ref 101–111)
CO2: 27 mmol/L (ref 22–32)
Creatinine, Ser: 0.63 mg/dL (ref 0.44–1.00)
GFR calc non Af Amer: >60 mL/min (ref >60)
Glucose, Bld: 79 mg/dL (ref 65–99)
POTASSIUM: 4.3 mmol/L (ref 3.5–5.1)
Sodium: 138 mmol/L (ref 135–145)

## 2015-10-06 LAB — I-STAT BETA HCG BLOOD, ED (MC, WL, AP ONLY)

## 2015-10-06 LAB — CBG MONITORING, ED
GLUCOSE-CAPILLARY: 90 mg/dL (ref 65–99)
Glucose-Capillary: 67 mg/dL (ref 65–99)

## 2015-10-06 MED ORDER — FLUCONAZOLE 150 MG PO TABS: 150.0000 mg | ORAL_TABLET | Freq: Once | ORAL | Status: DC

## 2015-10-06 MED ORDER — NAPROXEN 500 MG PO TABS: 500.0000 mg | ORAL_TABLET | Freq: Two times a day (BID) | ORAL | Status: DC

## 2015-10-06 MED ORDER — AZITHROMYCIN 250 MG PO TABS: ORAL_TABLET | ORAL | Status: DC

## 2015-10-06 NOTE — ED Notes (Signed)
CBG 67 

## 2015-10-06 NOTE — ED Provider Notes (Signed)
CSN: [id number]     Arrival date & time 10/06/15  1457 History   First MD Initiated Contact with Patient 10/06/15 1800     Chief Complaint  Patient presents with  . Generalized Body Aches     (Consider location/radiation/quality/duration/timing/severity/associated sxs/prior Treatment) HPI   Jasmin Ellis is a 24 y.o. female who complains of congestion, sore throat, productive cough, myalgias and headache for 4 days. She denies a history of anorexia, chest pain, fevers, rash, sweats, vomiting, weakness, weight loss and wheezing and denies a history of asthma. Patient does not smoke cigarettes. She has 2 children at home with similar sx   Past Medical History  Diagnosis Date  . Back pain, chronic     s/p fall 8 years ago  . Fibroids   . Diabetes mellitus without complication [facility])    Past Surgical History  Procedure Laterality Date  . No past surgeries     Family History  Problem Relation Age of Onset  . Diabetes Father    Social History  Substance Use Topics  . Smoking status: Never Smoker   . Smokeless tobacco: Never Used  . Alcohol Use: No   OB History    Gravida Para Term Preterm AB TAB SAB Ectopic Multiple Living   2 2 2  0 0 0 0 0 0 2     Review of Systems   Ten systems reviewed and are negative for acute change, except as noted in the HPI.   Allergies  Review of patient's allergies indicates no known allergies.  Home Medications   Prior to Admission medications   Medication Sig Start Date End Date Taking? Authorizing Provider  LACTOBACILLUS RHAMNOSUS, GG, PO Take 1 capsule by mouth daily.    Yes Historical Provider, MD  metFORMIN (GLUCOPHAGE) 500 MG tablet Take 500 mg by mouth 2 (two) times daily with a meal.    Yes Historical Provider, MD  Prenatal Vit-DSS-Fe Cbn-FA (OBSTETRIX EC) 29-1 MG TABS Take 1 tablet by mouth 1 day or 1 dose. 07/29/14  Yes Deirdre C Poe, CNM  azithromycin (ZITHROMAX Z-PAK) 250 MG tablet 2 po day one, then 1 daily x 4 days  10/06/15   Margarita Mail, PA-C  fluconazole (DIFLUCAN) 150 MG tablet Take 1 tablet (150 mg total) by mouth once. 10/06/15   Margarita Mail, PA-C  glucose blood (ONE TOUCH ULTRA TEST) test strip Use as instructed 12/22/14   [name], MD  glucose blood test strip Use as instructed 08/11/14   Truett Mainland, DO  Lancets [facility] ULTRASOFT) lancets Use as instructed 12/22/14   [name], MD  naproxen (NAPROSYN) 500 MG tablet Take 1 tablet (500 mg total) by mouth 2 (two) times daily with a meal. 10/06/15   Margarita Mail, PA-C  ONETOUCH DELICA LANCETS 99991111 MISC 1 Units by Does not apply route 4 (four) times daily. 08/11/14   Tanna Savoy Stinson, DO   BP 124/76 mmHg  Pulse 62  Temp(Src) 98 F (36.7 C) (Oral)  Resp 18  SpO2 100%  LMP 09/25/2015 Physical Exam Physical Exam  Constitutional: Pt  is oriented to person, place, and time. Appears well-developed and well-nourished. No distress.  HENT:  Head: Normocephalic and atraumatic.  Right Ear: Tympanic membrane, external ear and ear canal normal.  Left Ear: Tympanic membrane, external ear and ear canal normal.  Nose: Mucosal edema and nasal congestion present. No epistaxis. Right sinus exhibits no maxillary sinus tenderness and no frontal sinus tenderness. Left sinus exhibits no maxillary sinus tenderness and  no frontal sinus tenderness.  Mouth/Throat: Uvula is midline and mucous membranes are normal. Mucous membranes are not pale and not cyanotic. No oropharyngeal exudate, posterior oropharyngeal edema, posterior oropharyngeal erythema or tonsillar abscesses.  Eyes: Conjunctivae are normal. Pupils are equal, round, and reactive to light.  Neck: Normal range of motion and full passive range of motion without pain.  Cardiovascular: Normal rate and intact distal pulses.   Pulmonary/Chest: Effort normal and breath sounds normal. No stridor.  Clear and equal breath sounds without focal wheezes, rhonchi, rales  Abdominal: Soft. Bowel sounds  are normal. There is no tenderness.  Musculoskeletal: Normal range of motion.  Lymphadenopathy:    Pthas no cervical adenopathy.  Neurological: Pt is alert and oriented to person, place, and time.  Skin: Skin is warm and dry. No rash noted. Pt is not diaphoretic.  Psychiatric: Normal mood and affect.  Nursing note and vitals reviewed.   ED Course  Procedures (including critical care time) Labs Review Labs Reviewed  CBC WITH DIFFERENTIAL/PLATELET - Abnormal; Notable for the following:    Hemoglobin 11.8 (*)    HCT 34.3 (*)    All other components within normal limits  URINALYSIS, ROUTINE W REFLEX MICROSCOPIC (NOT AT [facility]) - Abnormal; Notable for the following:    APPearance CLOUDY (*)    Leukocytes, UA MODERATE (*)    All other components within normal limits  URINE MICROSCOPIC-ADD ON - Abnormal; Notable for the following:    Squamous Epithelial / LPF 0-5 (*)    Bacteria, UA FEW (*)    All other components within normal limits  BASIC METABOLIC PANEL  CBG MONITORING, ED  CBG MONITORING, ED  I-STAT BETA HCG BLOOD, ED ([facility], [facility], [facility] ONLY)    Imaging Review Dg Chest 2 View  10/06/2015  CLINICAL DATA:  24 year old female with cough headache and generalized body aches. EXAM: CHEST  2 VIEW COMPARISON:  None. FINDINGS: The heart size and mediastinal contours are within normal limits. Both lungs are clear. The visualized skeletal structures are unremarkable. IMPRESSION: No active cardiopulmonary disease. Electronically Signed   By: Anner Crete M.D.   On: 10/06/2015 18:59   I have personally reviewed and evaluated these images and lab results as part of my medical decision-making.   EKG Interpretation None      MDM   Final diagnoses:  URI (upper respiratory infection)  Myalgia   Pt CXR negative for acute infiltrate. Patients symptoms are consistent with URI, likely viral etiology. Discussed that antibiotics are not indicated for viral infections. Pt will be discharged with  symptomatic treatment.  Verbalizes understanding and is agreeable with plan. Pt is hemodynamically stable & in NAD prior to dc.     Margarita Mail, PA-C 10/08/15 [place], MD 10/21/15 [phone]

## 2015-10-06 NOTE — Discharge Instructions (Signed)
You appear to have an upper respiratory infection (URI). An upper respiratory tract infection, or cold, is a viral infection of the air passages leading to the lungs. It is contagious and can be spread to others, especially during the first 3 or 4 days. It cannot be cured by antibiotics or other medicines. RETURN IMMEDIATELY IF you develop shortness of breath, confusion or altered mental status, a new rash, become dizzy, faint, or poorly responsive, or are unable to be cared for at home.   Cough, Adult Coughing is a reflex that clears your throat and your airways. Coughing helps to heal and protect your lungs. It is normal to cough occasionally, but a cough that happens with other symptoms or lasts a long time may be a sign of a condition that needs treatment. A cough may last only 2-3 weeks (acute), or it may last longer than 8 weeks (chronic). CAUSES Coughing is commonly caused by:  Breathing in substances that irritate your lungs.  A viral or bacterial respiratory infection.  Allergies.  Asthma.  Postnasal drip.  Smoking.  Acid backing up from the stomach into the esophagus (gastroesophageal reflux).  Certain medicines.  Chronic lung problems, including COPD (or rarely, lung cancer).  Other medical conditions such as heart failure. HOME CARE INSTRUCTIONS  Pay attention to any changes in your symptoms. Take these actions to help with your discomfort:  Take medicines only as told by your health care provider.  If you were prescribed an antibiotic medicine, take it as told by your health care provider. Do not stop taking the antibiotic even if you start to feel better.  Talk with your health care provider before you take a cough suppressant medicine.  Drink enough fluid to keep your urine clear or pale yellow.  If the air is dry, use a cold steam vaporizer or humidifier in your bedroom or your home to help loosen secretions.  Avoid anything that causes you to cough at work or  at home.  If your cough is worse at night, try sleeping in a semi-upright position.  Avoid cigarette smoke. If you smoke, quit smoking. If you need help quitting, ask your health care provider.  Avoid caffeine.  Avoid alcohol.  Rest as needed. SEEK MEDICAL CARE IF:   You have new symptoms.  You cough up pus.  Your cough does not get better after 2-3 weeks, or your cough gets worse.  You cannot control your cough with suppressant medicines and you are losing sleep.  You develop pain that is getting worse or pain that is not controlled with pain medicines.  You have a fever.  You have unexplained weight loss.  You have night sweats. SEEK IMMEDIATE MEDICAL CARE IF:  You cough up blood.  You have difficulty breathing.  Your heartbeat is very fast.   This information is not intended to replace advice given to you by your health care provider. Make sure you discuss any questions you have with your health care provider.   Document Released: 04/01/2011 Document Revised: 06/24/2015 Document Reviewed: 12/10/2014 Elsevier Interactive Patient Education 2016 [employer]  Muscle Pain, Adult Muscle pain (myalgia) may be caused by many things, including:  Overuse or muscle strain, especially if you are not in shape. This is the most common cause of muscle pain.  Injury.  Bruises.  Viruses, such as the flu.  Infectious diseases.  Fibromyalgia, which is a chronic condition that causes muscle tenderness, fatigue, and headache.  Autoimmune diseases, including lupus.  Certain  drugs, including ACE inhibitors and statins. Muscle pain may be mild or severe. In most cases, the pain lasts only a short time and goes away without treatment. To diagnose the cause of your muscle pain, your health care provider will take your medical history. This means he or she will ask you when your muscle pain began and what has been happening. If you have not had muscle pain for very long, your  health care provider may want to wait before doing much testing. If your muscle pain has lasted a long time, your health care provider may want to run tests right away. If your health care provider thinks your muscle pain may be caused by illness, you may need to have additional tests to rule out certain conditions.  Treatment for muscle pain depends on the cause. Home care is often enough to relieve muscle pain. Your health care provider may also prescribe anti-inflammatory medicine. HOME CARE INSTRUCTIONS Watch your condition for any changes. The following actions may help to lessen any discomfort you are feeling:  Only take over-the-counter or prescription medicines as directed by your health care provider.  Apply ice to the sore muscle:  Put ice in a plastic bag.  Place a towel between your skin and the bag.  Leave the ice on for 15-20 minutes, 3-4 times a day.  You may alternate applying hot and cold packs to the muscle as directed by your health care provider.  If overuse is causing your muscle pain, slow down your activities until the pain goes away.  Remember that it is normal to feel some muscle pain after starting a workout program. Muscles that have not been used often will be sore at first.  Do regular, gentle exercises if you are not usually active.  Warm up before exercising to lower your risk of muscle pain.  Do not continue working out if the pain is very bad. Bad pain could mean you have injured a muscle. SEEK MEDICAL CARE IF:  Your muscle pain gets worse, and medicines do not help.  You have muscle pain that lasts longer than 3 days.  You have a rash or fever along with muscle pain.  You have muscle pain after a tick bite.  You have muscle pain while working out, even though you are in good physical condition.  You have redness, soreness, or swelling along with muscle pain.  You have muscle pain after starting a new medicine or changing the dose of a  medicine. SEEK IMMEDIATE MEDICAL CARE IF:  You have trouble breathing.  You have trouble swallowing.  You have muscle pain along with a stiff neck, fever, and vomiting.  You have severe muscle weakness or cannot move part of your body. MAKE SURE YOU:   Understand these instructions.  Will watch your condition.  Will get help right away if you are not doing well or get worse.   This information is not intended to replace advice given to you by your health care provider. Make sure you discuss any questions you have with your health care provider.   Document Released: 08/25/2006 Document Revised: 10/24/2014 Document Reviewed: 07/30/2013 Elsevier Interactive Patient Education [employer].

## 2015-10-06 NOTE — ED Notes (Signed)
Pt sts body aches and fatigue; pt sts she is diabetic

## 2015-10-06 NOTE — ED Notes (Signed)
Patient transported to X-ray 

## 2015-10-08 ENCOUNTER — Telehealth: Payer: Self-pay | Admitting: *Deleted

## 2015-10-08 NOTE — Telephone Encounter (Signed)
Pharmacy called related to Rx: azithromycin (ZITHROMAX Z-PAK) 250 MG tablet quantity 5 tablets.Marland KitchenMarland KitchenNCM clarified with EDP to change Rx to: quantity 6 tablets to fulfil Z-pack dosage.

## 2015-10-18 NOTE — L&D Delivery Note (Addendum)
Delivery Note At 10:19 PM a viable female was delivered via  (Presentation:OA  ).  APGAR: , ; weight  .   Placenta status: Spontaneous, intact, with small accessory lobe noted.  Cord:  WNL, with the following complications: None.  Cord pH: NA  Anesthesia:  Epidural Episiotomy: None Lacerations: 1st degree perineal, no repair required Suture Repair: NA Est. Blood Loss (mL):  150 cc  Mom to postpartum.  Baby to Couplet care / Skin to Skin. Family declines circumcision.  Patient undecided regarding contraception.  Donnel Saxon 06/24/2016, 10:48 PM  Addendum: Will check FBS and 2 hour pcs starting on day 1 pp. Restart Metformin 500 mg po BID.  Donnel Saxon, CNM 06/24/16 11:15p

## 2015-10-31 ENCOUNTER — Inpatient Hospital Stay (HOSPITAL_COMMUNITY): Payer: BLUE CROSS/BLUE SHIELD

## 2015-10-31 ENCOUNTER — Encounter (HOSPITAL_COMMUNITY): Payer: Self-pay

## 2015-10-31 ENCOUNTER — Inpatient Hospital Stay (HOSPITAL_COMMUNITY)
Admission: AD | Admit: 2015-10-31 | Discharge: 2015-10-31 | Disposition: A | Discharge status: Home / Self Care | Payer: BLUE CROSS/BLUE SHIELD | Source: Ambulatory Visit | Attending: Obstetrics & Gynecology | Admitting: Obstetrics & Gynecology

## 2015-10-31 DIAGNOSIS — R109 Unspecified abdominal pain: Secondary | ICD-10-CM

## 2015-10-31 DIAGNOSIS — O26891 Other specified pregnancy related conditions, first trimester: Secondary | ICD-10-CM | POA: Diagnosis present

## 2015-10-31 DIAGNOSIS — Z7984 Long term (current) use of oral hypoglycemic drugs: Secondary | ICD-10-CM | POA: Insufficient documentation

## 2015-10-31 DIAGNOSIS — Z3A01 Less than 8 weeks gestation of pregnancy: Secondary | ICD-10-CM | POA: Insufficient documentation

## 2015-10-31 DIAGNOSIS — O24311 Unspecified pre-existing diabetes mellitus in pregnancy, first trimester: Secondary | ICD-10-CM | POA: Insufficient documentation

## 2015-10-31 DIAGNOSIS — O26899 Other specified pregnancy related conditions, unspecified trimester: Secondary | ICD-10-CM

## 2015-10-31 DIAGNOSIS — R1031 Right lower quadrant pain: Secondary | ICD-10-CM | POA: Insufficient documentation

## 2015-10-31 DIAGNOSIS — E119 Type 2 diabetes mellitus without complications: Secondary | ICD-10-CM | POA: Insufficient documentation

## 2015-10-31 DIAGNOSIS — R1032 Left lower quadrant pain: Secondary | ICD-10-CM | POA: Diagnosis not present

## 2015-10-31 DIAGNOSIS — O3680X0 Pregnancy with inconclusive fetal viability, not applicable or unspecified: Secondary | ICD-10-CM

## 2015-10-31 DIAGNOSIS — O9989 Other specified diseases and conditions complicating pregnancy, childbirth and the puerperium: Secondary | ICD-10-CM | POA: Diagnosis not present

## 2015-10-31 LAB — WET PREP, GENITAL
CLUE CELLS WET PREP: NONE SEEN
SPERM: NONE SEEN
TRICH WET PREP: NONE SEEN
Yeast Wet Prep HPF POC: NONE SEEN

## 2015-10-31 LAB — COMPREHENSIVE METABOLIC PANEL
ALBUMIN: 4.2 g/dL (ref 3.5–5.0)
ALK PHOS: 59 U/L (ref 38–126)
ALT: 11 U/L — ABNORMAL LOW (ref 14–54)
ANION GAP: 7 (ref 5–15)
AST: 17 U/L (ref 15–41)
BUN: 8 mg/dL (ref 6–20)
CALCIUM: 10 mg/dL (ref 8.9–10.3)
CO2: 26 mmol/L (ref 22–32)
Chloride: 106 mmol/L (ref 101–111)
Creatinine, Ser: 0.55 mg/dL (ref 0.44–1.00)
GFR calc Af Amer: >60 mL/min (ref >60)
GFR calc non Af Amer: >60 mL/min (ref >60)
GLUCOSE: 89 mg/dL (ref 65–99)
Potassium: 4.2 mmol/L (ref 3.5–5.1)
SODIUM: 139 mmol/L (ref 135–145)
Total Bilirubin: 0.7 mg/dL (ref 0.3–1.2)
Total Protein: 7.6 g/dL (ref 6.5–8.1)

## 2015-10-31 LAB — CBC
HEMATOCRIT: 35.8 % — AB (ref 36.0–46.0)
Hemoglobin: 12.2 g/dL (ref 12.0–15.0)
MCH: 29 pg (ref 26.0–34.0)
MCHC: 34.1 g/dL (ref 30.0–36.0)
MCV: 85 fL (ref 78.0–100.0)
Platelets: 343 10*3/uL (ref 150–400)
RBC: 4.21 MIL/uL (ref 3.87–5.11)
RDW: 12.7 % (ref 11.5–15.5)
WBC: 6.4 10*3/uL (ref 4.0–10.5)

## 2015-10-31 LAB — URINALYSIS, ROUTINE W REFLEX MICROSCOPIC
BILIRUBIN URINE: NEGATIVE
GLUCOSE, UA: NEGATIVE mg/dL
HGB URINE DIPSTICK: NEGATIVE
Ketones, ur: 15 mg/dL — AB
Leukocytes, UA: NEGATIVE
Nitrite: NEGATIVE
Protein, ur: NEGATIVE mg/dL
SPECIFIC GRAVITY, URINE: 1.025 (ref 1.005–1.030)
pH: 6 (ref 5.0–8.0)

## 2015-10-31 LAB — GLUCOSE, CAPILLARY: Glucose-Capillary: 84 mg/dL (ref 65–99)

## 2015-10-31 LAB — POCT PREGNANCY, URINE: PREG TEST UR: POSITIVE — AB

## 2015-10-31 LAB — HCG, QUANTITATIVE, PREGNANCY: hCG, Beta Chain, Quant, S: 748 m[IU]/mL — ABNORMAL HIGH (ref <5)

## 2015-10-31 NOTE — Discharge Instructions (Signed)

## 2015-10-31 NOTE — MAU Note (Signed)
Lower abdominal pain for 3 days.  

## 2015-10-31 NOTE — MAU Note (Signed)
No vaginal discharge, no N/V/D, regular bowel movement, patient reports history of irregular periods.

## 2015-10-31 NOTE — MAU Provider Note (Signed)
History     CSN: [id number]  Arrival date and time: 10/31/15 1026   First Provider Initiated Contact with Patient 10/31/15 1118      Chief Complaint  Patient presents with  . Abdominal Pain   HPI   Ms.Chani Kallstrom is a 25 y.o. female G3P2002 at 26w1dwith a history of DM, on Metformin presenting to MAU with abdominal pain for 3 days. The pain comes and goes. She currently rates her pain 5/10. The pain is located in her lower abdomen, on both sides. The pain is crampy and sharp at times.  She denies vaginal bleeding or discharge.  She checks her blood sugars irregularly; she takes her metformin as prescribed.   OB History    Gravida Para Term Preterm AB TAB SAB Ectopic Multiple Living   '3 2 2 '$ 0 0 0 0 0 0 2      Past Medical History  Diagnosis Date  . Back pain, chronic     s/p fall 8 years ago  . Fibroids   . Diabetes mellitus without complication ([facility]     Past Surgical History  Procedure Laterality Date  . No past surgeries      Family History  Problem Relation Age of Onset  . Diabetes Father     Social History  Substance Use Topics  . Smoking status: Never Smoker   . Smokeless tobacco: Never Used  . Alcohol Use: No    Allergies: No Known Allergies  Prescriptions prior to admission  Medication Sig Dispense Refill Last Dose  . acetaminophen (TYLENOL) 325 MG tablet Take 650 mg by mouth every 6 (six) hours as needed for mild pain or headache.   10/31/2015 at Unknown time  . glucose blood (ONE TOUCH ULTRA TEST) test strip Use as instructed 100 each 12 10/31/2015 at Unknown time  . glucose blood test strip Use as instructed 100 each 12 10/31/2015 at Unknown time  . Lancets (ONETOUCH ULTRASOFT) lancets Use as instructed 100 each 12 10/31/2015 at Unknown time  . metFORMIN (GLUCOPHAGE) 500 MG tablet Take 500 mg by mouth 2 (two) times daily with a meal.    10/31/2015 at Unknown time  . ONETOUCH DELICA LANCETS 300QMISC 1 Units by Does not apply route 4 (four) times  daily. 100 each 12 10/31/2015 at Unknown time  . Prenatal Vit-DSS-Fe Cbn-FA (OBSTETRIX EC) 29-1 MG TABS Take 1 tablet by mouth 1 day or 1 dose. 30 each 0 10/31/2015 at Unknown time  . azithromycin (ZITHROMAX Z-PAK) 250 MG tablet 2 po day one, then 1 daily x 4 days (Patient not taking: Reported on 10/31/2015) 5 tablet 0   . fluconazole (DIFLUCAN) 150 MG tablet Take 1 tablet (150 mg total) by mouth once. (Patient not taking: Reported on 10/31/2015) 1 tablet 0   . naproxen (NAPROSYN) 500 MG tablet Take 1 tablet (500 mg total) by mouth 2 (two) times daily with a meal. (Patient not taking: Reported on 10/31/2015) 30 tablet 0    Results for orders placed or performed during the hospital encounter of 10/31/15 (from the past 48 hour(s))  Urinalysis, [facility] w reflex microscopic [facility]     Status: Abnormal   Collection Time: 10/31/15 10:40 AM  Result Value Ref Range   Color, Urine YELLOW YELLOW   APPearance HAZY (A) CLEAR   Specific Gravity, Urine 1.025 1.005 - 1.030   pH 6.0 5.0 - 8.0   Glucose, UA NEGATIVE NEGATIVE mg/dL   Hgb urine dipstick NEGATIVE NEGATIVE  Bilirubin Urine NEGATIVE NEGATIVE   Ketones, ur 15 (A) NEGATIVE mg/dL   Protein, ur NEGATIVE NEGATIVE mg/dL   Nitrite NEGATIVE NEGATIVE   Leukocytes, UA NEGATIVE NEGATIVE    Comment: MICROSCOPIC NOT DONE ON URINES WITH NEGATIVE PROTEIN, BLOOD, LEUKOCYTES, NITRITE, OR GLUCOSE <1000 mg/dL.  Pregnancy, urine POC     Status: Abnormal   Collection Time: 10/31/15 10:46 AM  Result Value Ref Range   Preg Test, Ur POSITIVE (A) NEGATIVE    Comment:        THE SENSITIVITY OF THIS METHODOLOGY IS >24 mIU/mL   CBC     Status: Abnormal   Collection Time: 10/31/15 11:30 AM  Result Value Ref Range   WBC 6.4 4.0 - 10.5 K/uL   RBC 4.21 3.87 - 5.11 MIL/uL   Hemoglobin 12.2 12.0 - 15.0 g/dL   HCT 35.8 (L) 36.0 - 46.0 %   MCV 85.0 78.0 - 100.0 fL   MCH 29.0 26.0 - 34.0 pg   MCHC 34.1 30.0 - 36.0 g/dL   RDW 12.7 11.5 - 15.5 %   Platelets 343 150  - 400 K/uL  hCG, quantitative, pregnancy     Status: Abnormal   Collection Time: 10/31/15 11:30 AM  Result Value Ref Range   hCG, Beta Chain, Quant, S 748 (H) <5 mIU/mL    Comment:          GEST. AGE      CONC.  (mIU/mL)   <=1 WEEK        5 - 50     2 WEEKS       50 - 500     3 WEEKS       100 - 10,000     4 WEEKS     1,000 - 30,000     5 WEEKS     3,500 - 115,000   6-8 WEEKS     12,000 - 270,000    12 WEEKS     15,000 - 220,000        FEMALE AND NON-PREGNANT FEMALE:     LESS THAN 5 mIU/mL   Comprehensive metabolic panel     Status: Abnormal   Collection Time: 10/31/15 11:30 AM  Result Value Ref Range   Sodium 139 135 - 145 mmol/L   Potassium 4.2 3.5 - 5.1 mmol/L   Chloride 106 101 - 111 mmol/L   CO2 26 22 - 32 mmol/L   Glucose, Bld 89 65 - 99 mg/dL   BUN 8 6 - 20 mg/dL   Creatinine, Ser 0.55 0.44 - 1.00 mg/dL   Calcium 10.0 8.9 - 10.3 mg/dL   Total Protein 7.6 6.5 - 8.1 g/dL   Albumin 4.2 3.5 - 5.0 g/dL   AST 17 15 - 41 U/L   ALT 11 (L) 14 - 54 U/L   Alkaline Phosphatase 59 38 - 126 U/L   Total Bilirubin 0.7 0.3 - 1.2 mg/dL   GFR calc non Af Amer >60 >60 mL/min   GFR calc Af Amer >60 >60 mL/min    Comment: (NOTE) The eGFR has been calculated using the CKD EPI equation. This calculation has not been validated in all clinical situations. eGFR's persistently <60 mL/min signify possible Chronic Kidney Disease.    Anion gap 7 5 - 15  Glucose, capillary     Status: None   Collection Time: 10/31/15 11:32 AM  Result Value Ref Range   Glucose-Capillary 84 65 - 99 mg/dL  Wet prep, genital  Status: Abnormal   Collection Time: 10/31/15  1:10 PM  Result Value Ref Range   Yeast Wet Prep HPF POC NONE SEEN NONE SEEN   Trich, Wet Prep NONE SEEN NONE SEEN   Clue Cells Wet Prep HPF POC NONE SEEN NONE SEEN   WBC, Wet Prep HPF POC FEW (A) NONE SEEN    Comment: MANY BACTERIA SEEN   Sperm NONE SEEN    US Ob Comp Less 14 Wks  10/31/2015  CLINICAL DATA:  Abdominal pain in first  trimester pregnancy. Cramping. Gestational age by LMP of 5 weeks 1 day. EXAM: OBSTETRIC <14 WK [place] AND TRANSVAGINAL OB US TECHNIQUE: Both transabdominal and transvaginal ultrasound examinations were performed for complete evaluation of the gestation as well as the maternal uterus, adnexal regions, and pelvic cul-de-sac. Transvaginal technique was performed to assess early pregnancy. COMPARISON:  None. FINDINGS: No intrauterine gestational sac visualized in endometrial cavity. Diffuse thickening of endometrium is seen measuring 40 mm. Trace amount of fluid noted in endometrial cavity. A calcified fibroid measuring 1.6 cm is seen in the left posterior corpus. This has a partial submucosal component indenting the endometrium. No other definite fibroids identified. Both ovaries are normal in appearance. No adnexal mass identified. Tiny amount of simple free fluid noted. IMPRESSION: Pregnancy location not visualized sonographically. Differential diagnosis includes recent spontaneous abortion, IUP too early to visualize, and non-visualized ectopic pregnancy. Recommend close follow up of quantitative B-HCG levels, and follow up US as clinically warranted. 1.6 cm calcified submucosal fibroid. Normal appearance of both ovaries.  No adnexal mass identified. Electronically Signed   By: Earle Gell M.D.   On: 10/31/2015 12:45   US Ob Transvaginal  10/31/2015  CLINICAL DATA:  Abdominal pain in first trimester pregnancy. Cramping. Gestational age by LMP of 5 weeks 1 day. EXAM: OBSTETRIC <14 WK [place] AND TRANSVAGINAL OB US TECHNIQUE: Both transabdominal and transvaginal ultrasound examinations were performed for complete evaluation of the gestation as well as the maternal uterus, adnexal regions, and pelvic cul-de-sac. Transvaginal technique was performed to assess early pregnancy. COMPARISON:  None. FINDINGS: No intrauterine gestational sac visualized in endometrial cavity. Diffuse thickening of endometrium is seen measuring 40  mm. Trace amount of fluid noted in endometrial cavity. A calcified fibroid measuring 1.6 cm is seen in the left posterior corpus. This has a partial submucosal component indenting the endometrium. No other definite fibroids identified. Both ovaries are normal in appearance. No adnexal mass identified. Tiny amount of simple free fluid noted. IMPRESSION: Pregnancy location not visualized sonographically. Differential diagnosis includes recent spontaneous abortion, IUP too early to visualize, and non-visualized ectopic pregnancy. Recommend close follow up of quantitative B-HCG levels, and follow up US as clinically warranted. 1.6 cm calcified submucosal fibroid. Normal appearance of both ovaries.  No adnexal mass identified. Electronically Signed   By: Earle Gell M.D.   On: 10/31/2015 12:45    Review of Systems  Constitutional: Negative for fever and chills.  Gastrointestinal: Positive for abdominal pain. Negative for nausea, vomiting and constipation.  Genitourinary: Negative for dysuria.   Physical Exam   Blood pressure 121/64, pulse 93, temperature 97.6 F (36.4 C), temperature source Oral, resp. rate 16, last menstrual period 09/25/2015, currently breastfeeding.  Physical Exam  Constitutional: She is oriented to person, place, and time. She appears well-developed and well-nourished. No distress.  HENT:  Head: Normocephalic.  Eyes: Pupils are equal, round, and reactive to light.  Cardiovascular: Normal rate and normal heart sounds.   GI: Soft. She exhibits no  distension and no mass. There is no tenderness. There is no rebound and no guarding.  Genitourinary: Vaginal discharge found.  Speculum exam: Vagina - Small amount of creamy discharge, no odor Cervix - No contact bleeding Bimanual exam: Cervix closed Uterus non tender, normal size Adnexa non tender, no masses bilaterally GC/Chlam, wet prep done Chaperone present for exam.  Musculoskeletal: Normal range of motion.  Neurological:  She is alert and oriented to person, place, and time.  Skin: She is not diaphoretic.    MAU Course  Procedures  None  MDM   Assessment and Plan    A:  1. Pregnancy of unknown anatomic location   2. Abdominal pain in pregnancy, antepartum     P:  Discharge home in stable condition Follow up on Monday in the [place] for repeat quant. Go to the clinic '@1'$ :00 Return to MAU if symptoms worsen Pelvic rest Ectopic precautions Continue metformin as prescribed.    Lezlie Lye, NP] 10/31/2015 2:47 PM

## 2015-11-01 LAB — HIV ANTIBODY (ROUTINE TESTING W REFLEX): HIV SCREEN 4TH GENERATION: NONREACTIVE

## 2015-11-02 ENCOUNTER — Ambulatory Visit: Payer: BLUE CROSS/BLUE SHIELD | Admitting: Obstetrics & Gynecology

## 2015-11-02 ENCOUNTER — Telehealth: Payer: Self-pay | Admitting: *Deleted

## 2015-11-02 LAB — GC/CHLAMYDIA PROBE AMP (~~LOC~~) NOT AT ARMC
Chlamydia: NEGATIVE
NEISSERIA GONORRHEA: NEGATIVE

## 2015-11-02 NOTE — Telephone Encounter (Signed)
Patient did not show for stat hcg and provider appointment for plan. I called patient and her husband answered. He was not with her and they do not have another phone. I advised that she missed an important appointment today. He stated that he will see her tonight and have her call us tomorrow morning. Advised if she had any problems before that time to go to MAU. He voiced understanding and said patient will call to reschedule tomorrow.

## 2016-01-15 ENCOUNTER — Other Ambulatory Visit: Payer: Self-pay | Admitting: Obstetrics

## 2016-01-18 LAB — CYTOLOGY - PAP

## 2016-02-05 LAB — OB RESULTS CONSOLE ABO/RH: RH Type: POSITIVE

## 2016-02-05 LAB — OB RESULTS CONSOLE HEPATITIS B SURFACE ANTIGEN: Hepatitis B Surface Ag: NEGATIVE

## 2016-02-05 LAB — OB RESULTS CONSOLE GC/CHLAMYDIA
CHLAMYDIA, DNA PROBE: NEGATIVE
GC PROBE AMP, GENITAL: NEGATIVE

## 2016-02-05 LAB — OB RESULTS CONSOLE ANTIBODY SCREEN: Antibody Screen: NEGATIVE

## 2016-02-05 LAB — OB RESULTS CONSOLE RPR: RPR: NONREACTIVE

## 2016-02-05 LAB — OB RESULTS CONSOLE HIV ANTIBODY (ROUTINE TESTING): HIV: NONREACTIVE

## 2016-02-05 LAB — OB RESULTS CONSOLE RUBELLA ANTIBODY, IGM: RUBELLA: IMMUNE

## 2016-02-23 ENCOUNTER — Emergency Department (HOSPITAL_COMMUNITY)
Admission: EM | Admit: 2016-02-23 | Discharge: 2016-02-23 | Disposition: A | Discharge status: Home / Self Care | Payer: BLUE CROSS/BLUE SHIELD | Attending: Emergency Medicine | Admitting: Emergency Medicine

## 2016-02-23 ENCOUNTER — Encounter (HOSPITAL_COMMUNITY): Payer: Self-pay | Admitting: Emergency Medicine

## 2016-02-23 DIAGNOSIS — O9989 Other specified diseases and conditions complicating pregnancy, childbirth and the puerperium: Secondary | ICD-10-CM | POA: Insufficient documentation

## 2016-02-23 DIAGNOSIS — Z79899 Other long term (current) drug therapy: Secondary | ICD-10-CM | POA: Diagnosis not present

## 2016-02-23 DIAGNOSIS — M778 Other enthesopathies, not elsewhere classified: Secondary | ICD-10-CM | POA: Diagnosis not present

## 2016-02-23 DIAGNOSIS — Z7984 Long term (current) use of oral hypoglycemic drugs: Secondary | ICD-10-CM | POA: Diagnosis not present

## 2016-02-23 DIAGNOSIS — G8929 Other chronic pain: Secondary | ICD-10-CM | POA: Diagnosis not present

## 2016-02-23 DIAGNOSIS — Z3A20 20 weeks gestation of pregnancy: Secondary | ICD-10-CM | POA: Diagnosis not present

## 2016-02-23 DIAGNOSIS — Z86018 Personal history of other benign neoplasm: Secondary | ICD-10-CM | POA: Diagnosis not present

## 2016-02-23 DIAGNOSIS — M779 Enthesopathy, unspecified: Secondary | ICD-10-CM

## 2016-02-23 DIAGNOSIS — O24312 Unspecified pre-existing diabetes mellitus in pregnancy, second trimester: Secondary | ICD-10-CM | POA: Insufficient documentation

## 2016-02-23 NOTE — ED Notes (Signed)
Ortho Tech in to apply wrist splint.

## 2016-02-23 NOTE — Progress Notes (Signed)
Orthopedic Tech Progress Note Patient Details:  Jasmin Ellis February 09, 1991 [id number]  Ortho Devices Type of Ortho Device: Velcro wrist splint Ortho Device/Splint Location: rue Ortho Device/Splint Interventions: Application   Jasmin Ellis 02/23/2016, 10:28 AM

## 2016-02-23 NOTE — ED Provider Notes (Signed)
CSN: [id number]     Arrival date & time 02/23/16  1003 History  By signing my name below, I, Jasmin Ellis, attest that this documentation has been prepared under the direction and in the presence of non-physician practitioner, Glendell Docker, NP. Electronically Signed: Evelene Ellis, Scribe. 02/23/2016. 10:18 AM.    Chief Complaint  Patient presents with  . Wrist Pain    The history is provided by the patient. No language interpreter was used.   HPI Comments:  Jasmin Ellis is a 25 y.o. female who presents to the Emergency Department complaining of moderate, constant, right wrist pain x 1 week. Her pain is exacerbated with movement of the wrist. She denies radiation of pain to the hand/fingers. She also denies recent fall/injury. No alleviating factors noted; no treatments tried. Pt is currently ~[redacted] weeks pregnant; G3P1A1. Pt has OB appointment scheduled for tomorrow, 02/24/16. Pt has no other complaints or symptoms at this time.   Past Medical History  Diagnosis Date  . Back pain, chronic     s/p fall 8 years ago  . Fibroids   . Diabetes mellitus without complication [facility])    Past Surgical History  Procedure Laterality Date  . No past surgeries     Family History  Problem Relation Age of Onset  . Diabetes Father    Social History  Substance Use Topics  . Smoking status: Never Smoker   . Smokeless tobacco: Never Used  . Alcohol Use: No   OB History    Gravida Para Term Preterm AB TAB SAB Ectopic Multiple Living   3 2 2  0 0 0 0 0 0 2     Review of Systems  Gastrointestinal: Negative for vomiting and abdominal pain.  Musculoskeletal: Positive for arthralgias (right wrist).  Neurological: Negative for weakness and numbness.    Allergies  Review of patient's allergies indicates no known allergies.  Home Medications   Prior to Admission medications   Medication Sig Start Date End Date Taking? Authorizing Provider  acetaminophen (TYLENOL) 325 MG tablet Take 650 mg by  mouth every 6 (six) hours as needed for mild pain or headache.    Historical Provider, MD  glucose blood (ONE TOUCH ULTRA TEST) test strip Use as instructed 12/22/14   [name], MD  glucose blood test strip Use as instructed 08/11/14   Truett Mainland, DO  Lancets [facility] ULTRASOFT) lancets Use as instructed 12/22/14   [name], MD  metFORMIN (GLUCOPHAGE) 500 MG tablet Take 500 mg by mouth 2 (two) times daily with a meal.     Historical Provider, MD  [facility] DELICA LANCETS [id number] MISC 1 Units by Does not apply route 4 (four) times daily. 08/11/14   Truett Mainland, DO  Prenatal Vit-DSS-Fe Cbn-FA (OBSTETRIX EC) 29-1 MG TABS Take 1 tablet by mouth 1 day or 1 dose. 07/29/14   Deirdre C Poe, CNM   BP 120/68 mmHg  Pulse 95  Temp(Src) 98.1 F (36.7 C) (Oral)  Resp 16  SpO2 100%  LMP 09/25/2015 Physical Exam  Constitutional: She is oriented to person, place, and time. She appears well-developed and well-nourished. No distress.  HENT:  Head: Normocephalic and atraumatic.  Eyes: Conjunctivae are normal.  Cardiovascular: Normal rate.   Pulmonary/Chest: Effort normal.  Abdominal: She exhibits no distension.  Musculoskeletal: Normal range of motion.  Tender along the medial aspect of the right wrist. Full rom. Pulses intact. Equal grip strength bilaterally  Neurological: She is alert and oriented to person, place, and time.  Skin: Skin is warm and dry.  Psychiatric: She has a normal mood and affect.  Nursing note and vitals reviewed.   ED Course  Procedures   DIAGNOSTIC STUDIES:    COORDINATION OF CARE:  10:16 AM Discussed treatment plan with pt at bedside and pt agreed to plan.  MDM   Final diagnoses:  Tendonitis    Likely carpal tunnel from pregnancy. Will splint. Discussed use of tylenol and follow up with ortho as needed  I personally performed the services described in this documentation, which was scribed in my presence. The recorded information has been  reviewed and is accurate.    Glendell Docker, NP 02/23/16 1024  Elnora Morrison, MD 02/23/16 [phone]

## 2016-02-23 NOTE — ED Notes (Signed)
C/O RIGHT WRIST PAIN X 1 WEEK. PT IS [redacted] WEEKS PREGNANT.

## 2016-06-03 LAB — OB RESULTS CONSOLE GBS: GBS: POSITIVE

## 2016-06-15 ENCOUNTER — Telehealth (HOSPITAL_COMMUNITY): Payer: Self-pay | Admitting: *Deleted

## 2016-06-15 ENCOUNTER — Encounter (HOSPITAL_COMMUNITY): Payer: Self-pay | Admitting: *Deleted

## 2016-06-15 NOTE — Telephone Encounter (Signed)
Preadmission screen  

## 2016-06-23 ENCOUNTER — Other Ambulatory Visit: Payer: Self-pay | Admitting: Obstetrics and Gynecology

## 2016-06-24 ENCOUNTER — Encounter (HOSPITAL_COMMUNITY): Payer: Self-pay

## 2016-06-24 ENCOUNTER — Other Ambulatory Visit: Payer: Self-pay | Admitting: Obstetrics and Gynecology

## 2016-06-24 ENCOUNTER — Inpatient Hospital Stay (HOSPITAL_COMMUNITY): Payer: BLUE CROSS/BLUE SHIELD | Admitting: Anesthesiology

## 2016-06-24 ENCOUNTER — Inpatient Hospital Stay (HOSPITAL_COMMUNITY)
Admission: RE | Admit: 2016-06-24 | Discharge: 2016-06-26 | DRG: 774 | Disposition: A | Discharge status: Home / Self Care | Payer: BLUE CROSS/BLUE SHIELD | Source: Ambulatory Visit | Attending: Obstetrics and Gynecology | Admitting: Obstetrics and Gynecology

## 2016-06-24 DIAGNOSIS — O9902 Anemia complicating childbirth: Secondary | ICD-10-CM | POA: Diagnosis present

## 2016-06-24 DIAGNOSIS — D649 Anemia, unspecified: Secondary | ICD-10-CM | POA: Diagnosis present

## 2016-06-24 DIAGNOSIS — O358XX0 Maternal care for other (suspected) fetal abnormality and damage, not applicable or unspecified: Secondary | ICD-10-CM | POA: Diagnosis present

## 2016-06-24 DIAGNOSIS — E119 Type 2 diabetes mellitus without complications: Secondary | ICD-10-CM | POA: Diagnosis present

## 2016-06-24 DIAGNOSIS — O403XX0 Polyhydramnios, third trimester, not applicable or unspecified: Secondary | ICD-10-CM | POA: Diagnosis present

## 2016-06-24 DIAGNOSIS — O24919 Unspecified diabetes mellitus in pregnancy, unspecified trimester: Secondary | ICD-10-CM | POA: Diagnosis present

## 2016-06-24 DIAGNOSIS — O99824 Streptococcus B carrier state complicating childbirth: Secondary | ICD-10-CM | POA: Diagnosis present

## 2016-06-24 DIAGNOSIS — Z7984 Long term (current) use of oral hypoglycemic drugs: Secondary | ICD-10-CM | POA: Diagnosis not present

## 2016-06-24 DIAGNOSIS — H00016 Hordeolum externum left eye, unspecified eyelid: Secondary | ICD-10-CM | POA: Diagnosis present

## 2016-06-24 DIAGNOSIS — O2412 Pre-existing diabetes mellitus, type 2, in childbirth: Principal | ICD-10-CM | POA: Diagnosis present

## 2016-06-24 DIAGNOSIS — O24913 Unspecified diabetes mellitus in pregnancy, third trimester: Secondary | ICD-10-CM | POA: Diagnosis present

## 2016-06-24 DIAGNOSIS — Z3A39 39 weeks gestation of pregnancy: Secondary | ICD-10-CM | POA: Diagnosis not present

## 2016-06-24 LAB — GLUCOSE, CAPILLARY
GLUCOSE-CAPILLARY: 77 mg/dL (ref 65–99)
GLUCOSE-CAPILLARY: 74 mg/dL (ref 65–99)
GLUCOSE-CAPILLARY: 67 mg/dL (ref 65–99)

## 2016-06-24 LAB — CBC
HCT: 30.9 % — ABNORMAL LOW (ref 36.0–46.0)
HEMOGLOBIN: 10.7 g/dL — AB (ref 12.0–15.0)
MCH: 29.1 pg (ref 26.0–34.0)
MCHC: 34.6 g/dL (ref 30.0–36.0)
MCV: 84 fL (ref 78.0–100.0)
Platelets: 197 10*3/uL (ref 150–400)
RBC: 3.68 MIL/uL — AB (ref 3.87–5.11)
RDW: 13.6 % (ref 11.5–15.5)
WBC: 7 10*3/uL (ref 4.0–10.5)

## 2016-06-24 LAB — TYPE AND SCREEN
ABO/RH(D): O POS
Antibody Screen: NEGATIVE

## 2016-06-24 MED ORDER — IBUPROFEN 600 MG PO TABS
600.0000 mg | ORAL_TABLET | Freq: Four times a day (QID) | ORAL | Status: DC
  Administered 2016-06-25 – 2016-06-26 (×7): 600 mg via ORAL
  Filled 2016-06-24 (×7): qty 1

## 2016-06-24 MED ORDER — ONDANSETRON HCL 4 MG/2ML IJ SOLN: 4.0000 mg | INTRAMUSCULAR | Status: DC | PRN

## 2016-06-24 MED ORDER — PENICILLIN G POTASSIUM 5000000 UNITS IJ SOLR
5.0000 10*6.[IU] | Freq: Once | INTRAVENOUS | Status: AC
  Administered 2016-06-24: 5 10*6.[IU] via INTRAVENOUS
  Filled 2016-06-24: qty 5

## 2016-06-24 MED ORDER — TERBUTALINE SULFATE 1 MG/ML IJ SOLN: 0.2500 mg | Freq: Once | INTRAMUSCULAR | Status: DC | PRN

## 2016-06-24 MED ORDER — EPHEDRINE 5 MG/ML INJ: 10.0000 mg | INTRAVENOUS | Status: DC | PRN

## 2016-06-24 MED ORDER — ONDANSETRON HCL 4 MG PO TABS: 4.0000 mg | ORAL_TABLET | ORAL | Status: DC | PRN

## 2016-06-24 MED ORDER — OXYTOCIN 40 UNITS IN LACTATED RINGERS INFUSION - SIMPLE MED
2.5000 [IU]/h | INTRAVENOUS | Status: DC
  Filled 2016-06-24: qty 1000

## 2016-06-24 MED ORDER — LACTATED RINGERS IV SOLN: 500.0000 mL | INTRAVENOUS | Status: DC | PRN

## 2016-06-24 MED ORDER — WITCH HAZEL-GLYCERIN EX PADS: 1.0000 "application " | MEDICATED_PAD | CUTANEOUS | Status: DC | PRN

## 2016-06-24 MED ORDER — LACTATED RINGERS IV SOLN: 500.0000 mL | Freq: Once | INTRAVENOUS | Status: DC

## 2016-06-24 MED ORDER — FENTANYL CITRATE (PF) 100 MCG/2ML IJ SOLN: 50.0000 ug | INTRAMUSCULAR | Status: DC | PRN

## 2016-06-24 MED ORDER — ONDANSETRON HCL 4 MG/2ML IJ SOLN: 4.0000 mg | Freq: Four times a day (QID) | INTRAMUSCULAR | Status: DC | PRN

## 2016-06-24 MED ORDER — PHENYLEPHRINE 40 MCG/ML (10ML) SYRINGE FOR IV PUSH (FOR BLOOD PRESSURE SUPPORT): 80.0000 ug | PREFILLED_SYRINGE | INTRAVENOUS | Status: DC | PRN

## 2016-06-24 MED ORDER — OXYCODONE-ACETAMINOPHEN 5-325 MG PO TABS: 2.0000 | ORAL_TABLET | ORAL | Status: DC | PRN

## 2016-06-24 MED ORDER — ACETAMINOPHEN 325 MG PO TABS
650.0000 mg | ORAL_TABLET | ORAL | Status: DC | PRN
Start: 2016-06-24 — End: 2016-06-26

## 2016-06-24 MED ORDER — LACTATED RINGERS IV SOLN
INTRAVENOUS | Status: DC
  Administered 2016-06-24: 12:00:00 via INTRAVENOUS

## 2016-06-24 MED ORDER — METFORMIN HCL 500 MG PO TABS
500.0000 mg | ORAL_TABLET | Freq: Two times a day (BID) | ORAL | Status: DC
  Administered 2016-06-25 – 2016-06-26 (×3): 500 mg via ORAL
  Filled 2016-06-24 (×5): qty 1

## 2016-06-24 MED ORDER — PHENYLEPHRINE 40 MCG/ML (10ML) SYRINGE FOR IV PUSH (FOR BLOOD PRESSURE SUPPORT)
PREFILLED_SYRINGE | INTRAVENOUS | Status: AC
  Filled 2016-06-24: qty 10

## 2016-06-24 MED ORDER — COCONUT OIL OIL
1.0000 "application " | TOPICAL_OIL | Status: DC | PRN
  Filled 2016-06-24 (×2): qty 120

## 2016-06-24 MED ORDER — OXYCODONE HCL 5 MG PO TABS: 5.0000 mg | ORAL_TABLET | ORAL | Status: DC | PRN

## 2016-06-24 MED ORDER — FENTANYL 2.5 MCG/ML BUPIVACAINE 1/10 % EPIDURAL INFUSION (WH - ANES): 16.0000 mL/h | INTRAMUSCULAR | Status: DC | PRN

## 2016-06-24 MED ORDER — PRENATAL MULTIVITAMIN CH
1.0000 | ORAL_TABLET | Freq: Every day | ORAL | Status: DC
  Administered 2016-06-25 – 2016-06-26 (×3): 1 via ORAL
  Filled 2016-06-24 (×4): qty 1

## 2016-06-24 MED ORDER — ACETAMINOPHEN 325 MG PO TABS: 650.0000 mg | ORAL_TABLET | ORAL | Status: DC | PRN

## 2016-06-24 MED ORDER — DIPHENHYDRAMINE HCL 25 MG PO CAPS: 25.0000 mg | ORAL_CAPSULE | Freq: Four times a day (QID) | ORAL | Status: DC | PRN

## 2016-06-24 MED ORDER — OXYTOCIN 40 UNITS IN LACTATED RINGERS INFUSION - SIMPLE MED
1.0000 m[IU]/min | INTRAVENOUS | Status: DC
  Administered 2016-06-24: 2 m[IU]/min via INTRAVENOUS

## 2016-06-24 MED ORDER — DIPHENHYDRAMINE HCL 50 MG/ML IJ SOLN: 12.5000 mg | INTRAMUSCULAR | Status: DC | PRN

## 2016-06-24 MED ORDER — SOD CITRATE-CITRIC ACID 500-334 MG/5ML PO SOLN: 30.0000 mL | ORAL | Status: DC | PRN

## 2016-06-24 MED ORDER — FENTANYL 2.5 MCG/ML BUPIVACAINE 1/10 % EPIDURAL INFUSION (WH - ANES)
INTRAMUSCULAR | Status: AC
  Filled 2016-06-24: qty 125

## 2016-06-24 MED ORDER — SIMETHICONE 80 MG PO CHEW
80.0000 mg | CHEWABLE_TABLET | ORAL | Status: DC | PRN
  Filled 2016-06-24: qty 1

## 2016-06-24 MED ORDER — PENICILLIN G POTASSIUM 5000000 UNITS IJ SOLR
2.5000 10*6.[IU] | INTRAVENOUS | Status: DC
  Administered 2016-06-24 (×2): 2.5 10*6.[IU] via INTRAVENOUS
  Filled 2016-06-24 (×3): qty 2.5

## 2016-06-24 MED ORDER — TETANUS-DIPHTH-ACELL PERTUSSIS 5-2.5-18.5 LF-MCG/0.5 IM SUSP
0.5000 mL | Freq: Once | INTRAMUSCULAR | Status: DC
Start: 2016-06-25 — End: 2016-06-26
  Filled 2016-06-24: qty 0.5

## 2016-06-24 MED ORDER — DIBUCAINE 1 % RE OINT
1.0000 | TOPICAL_OINTMENT | RECTAL | Status: DC | PRN
Start: 2016-06-24 — End: 2016-06-26
  Filled 2016-06-24: qty 28.4

## 2016-06-24 MED ORDER — OXYCODONE-ACETAMINOPHEN 5-325 MG PO TABS: 1.0000 | ORAL_TABLET | ORAL | Status: DC | PRN

## 2016-06-24 MED ORDER — OXYCODONE HCL 5 MG PO TABS: 10.0000 mg | ORAL_TABLET | ORAL | Status: DC | PRN

## 2016-06-24 MED ORDER — FENTANYL 2.5 MCG/ML BUPIVACAINE 1/10 % EPIDURAL INFUSION (WH - ANES)
14.0000 mL/h | INTRAMUSCULAR | Status: DC | PRN
  Administered 2016-06-24: 14 mL/h via EPIDURAL

## 2016-06-24 MED ORDER — SENNOSIDES-DOCUSATE SODIUM 8.6-50 MG PO TABS
2.0000 | ORAL_TABLET | ORAL | Status: DC
  Administered 2016-06-25 (×2): 2 via ORAL
  Filled 2016-06-24 (×2): qty 2

## 2016-06-24 MED ORDER — BENZOCAINE-MENTHOL 20-0.5 % EX AERO
1.0000 "application " | INHALATION_SPRAY | CUTANEOUS | Status: DC | PRN
  Administered 2016-06-25: 1 via TOPICAL
  Filled 2016-06-24 (×2): qty 56

## 2016-06-24 MED ORDER — LIDOCAINE HCL (PF) 1 % IJ SOLN: 30.0000 mL | INTRAMUSCULAR | Status: DC | PRN

## 2016-06-24 MED ORDER — OXYTOCIN BOLUS FROM INFUSION
500.0000 mL | Freq: Once | INTRAVENOUS | Status: AC
  Administered 2016-06-24: 500 mL via INTRAVENOUS

## 2016-06-24 MED ORDER — ZOLPIDEM TARTRATE 5 MG PO TABS: 5.0000 mg | ORAL_TABLET | Freq: Every evening | ORAL | Status: DC | PRN

## 2016-06-24 NOTE — Progress Notes (Signed)
  Subjective: Feeling much better after epidural redose.  Aware of some mild pain, but minimal.  Denies sensation of pressure.  Objective: BP 127/69   Pulse 79   Temp 97.3 F (36.3 C) (Oral)   Resp 20   Ht 5' 3.5" (1.613 m)   Wt 88.9 kg (196 lb)   LMP 09/25/2015   SpO2 100%   BMI 34.18 kg/m  No intake/output data recorded. No intake/output data recorded.  FHT: Category 1 UC:   regular, every 2 minutes SVE:  Dilation 10 cm, vtx, 0 station at 2100--no descent with trial push Pitocin previously on 10 mu/min, cut down to 4 due to frequency of UCs, now turned off due to UCs still q 2 min.  Assessment:  2nd stage labor--no urge to push GBS positive  Plan: Allow to labor down, await increased pressure or further descent. Position to facilitate descent.  Donnel Saxon CNM 06/24/2016, 9:28 PM

## 2016-06-24 NOTE — Progress Notes (Signed)
  Subjective: Epidural just placed, patient a little more comfortable, but still 6/10 on pain scale.  Objective: BP 118/72   Pulse 79   Temp 97.5 F (36.4 C) (Oral)   Resp 16   Ht 5' 3.5" (1.613 m)   Wt 88.9 kg (196 lb)   LMP 09/25/2015   SpO2 100%   BMI 34.18 kg/m  No intake/output data recorded. No intake/output data recorded.  FHT: Category 1 UC:   regular, every 2 minutes SVE:   Dilation: 5 Effacement (%): 90 Station: -2, -1 Exam by:: Rivard at 7pm Pitocin at 10 mu/min  CBG (last 3)   Recent Labs  06/24/16 1237 06/24/16 1636  GLUCAP 77 74     Assessment:  IUP at 56 weeks--induction for Type 2 DM, on Metformin GBS positive Polyhydramnios Anemia (Hgb 10.7 on admission) Mild fetal pleural effusion noted at 37 weeks    Plan: Continue current care Epidural PCA doses prn, anesthesia consult if ineffective. Anticipate vaginal delivery. Plan FBS and 2 hour pcs after delivery Nursery notified regarding fetal pleural effusion.   Donnel Saxon CNM 06/24/2016, 8:19 PM

## 2016-06-24 NOTE — Progress Notes (Signed)
Subjective:  Comfortable with  Labor support without medications Contractions every 2 minutes, lasting 45 seconds, intensity 5/10    Objective: BP 127/74   Pulse 86   Temp 97.5 F (36.4 C) (Oral)   Resp 18   Ht 5' 3.5" (1.613 m)   Wt 196 lb (88.9 kg)   LMP 09/25/2015   BMI 34.18 kg/m  No intake/output data recorded. No intake/output data recorded.  FHT:  Category 1 SVE:   Dilation: 4 Effacement (%): 70 Station: -2, -1 Exam by:: [id number]   Forebag of water ruptured: clear fluid  Labs: Lab Results  Component Value Date   WBC 7.0 06/24/2016   HGB 10.7 (L) 06/24/2016   HCT 30.9 (L) 06/24/2016   MCV 84.0 06/24/2016   PLT 197 06/24/2016    Assessment / Plan:  Fetal Wellbeing: reassuring Anticipated MOD:  NSVD  Teyanna Thielman A 06/24/2016, 4:49 PM

## 2016-06-24 NOTE — Anesthesia Preprocedure Evaluation (Signed)
Anesthesia Evaluation  Patient identified by MRN, date of birth, ID band Patient awake    Reviewed: Allergy & Precautions, H&P , Patient's Chart, lab work & pertinent test results  Airway Mallampati: III  TM Distance: >3 FB Neck ROM: full    Dental no notable dental hx. (+) Teeth Intact   Pulmonary neg pulmonary ROS,    Pulmonary exam normal breath sounds clear to auscultation       Cardiovascular hypertension, negative cardio ROS Normal cardiovascular exam Rhythm:regular Rate:Normal     Neuro/Psych negative neurological ROS  negative psych ROS   GI/Hepatic negative GI ROS, Neg liver ROS,   Endo/Other  diabetes, Well Controlled, Type 2, Oral Hypoglycemic AgentsObesity  Renal/GU negative Renal ROS  negative genitourinary   Musculoskeletal   Abdominal   Peds  Hematology  (+) anemia ,   Anesthesia Other Findings   Reproductive/Obstetrics (+) Pregnancy                               Chemistry      Component Value Date/Time   NA 139 10/31/2015 1130   K 4.2 10/31/2015 1130   CL 106 10/31/2015 1130   CO2 26 10/31/2015 1130   BUN 8 10/31/2015 1130   CREATININE 0.55 10/31/2015 1130   CREATININE 0.40 (L) 08/25/2014 1427      Component Value Date/Time   CALCIUM 10.0 10/31/2015 1130   ALKPHOS 59 10/31/2015 1130   AST 17 10/31/2015 1130   ALT 11 (L) 10/31/2015 1130   BILITOT 0.7 10/31/2015 1130     Lab Results  Component Value Date   WBC 7.0 06/24/2016   HGB 10.7 (L) 06/24/2016   HCT 30.9 (L) 06/24/2016   MCV 84.0 06/24/2016   PLT 197 06/24/2016    Anesthesia Physical  Anesthesia Plan  ASA: II  Anesthesia Plan: Epidural   Post-op Pain Management:    Induction:   Airway Management Planned:   Additional Equipment:   Intra-op Plan:   Post-operative Plan:   Informed Consent: I have reviewed the patients History and Physical, chart, labs and discussed the procedure  including the risks, benefits and alternatives for the proposed anesthesia with the patient or authorized representative who has indicated his/her understanding and acceptance.     Plan Discussed with: Anesthesiologist  Anesthesia Plan Comments:         Anesthesia Quick Evaluation

## 2016-06-24 NOTE — Progress Notes (Signed)
Subjective:  Patient is requesting epidural Contractions every 2 minutes, lasting 50 seconds, intensity 9/10    Objective: BP 124/87   Pulse 84   Temp 97.5 F (36.4 C) (Oral)   Resp (!) 22   Ht 5' 3.5" (1.613 m)   Wt 196 lb (88.9 kg)   LMP 09/25/2015   BMI 34.18 kg/m  No intake/output data recorded. No intake/output data recorded.  FHT: category 1 SVE:   Dilation: 5 Effacement (%): 90 Station: -2, -1 Exam by:: [id number]  Labs: Lab Results  Component Value Date   WBC 7.0 06/24/2016   HGB 10.7 (L) 06/24/2016   HCT 30.9 (L) 06/24/2016   MCV 84.0 06/24/2016   PLT 197 06/24/2016    Assessment / Plan: Induction of labor due to type 2 diabetes,  progressing well on pitocin Fetal Wellbeing: reassuring Anticipated MOD:  NSVD  Marcial Pless A 06/24/2016, 7:01 PM

## 2016-06-24 NOTE — Anesthesia Procedure Notes (Signed)
Epidural Patient location during procedure: OB  Staffing Anesthesiologist: Willia Lampert Performed: anesthesiologist   Preanesthetic Checklist Completed: patient identified, pre-op evaluation, timeout performed, IV checked, risks and benefits discussed and monitors and equipment checked  Epidural Patient position: sitting Prep: site prepped and draped and DuraPrep Patient monitoring: heart rate Approach: midline Location: L2-L3 Injection technique: LOR air and LOR saline  Needle:  Needle type: Tuohy  Needle gauge: 17 G Needle length: 9 cm Needle insertion depth: 6 cm Catheter type: closed end flexible Catheter size: 19 Gauge Catheter at skin depth: 12 cm Test dose: negative  Assessment Sensory level: T8 Events: blood not aspirated, injection not painful, no injection resistance, negative IV test and no paresthesia  Additional Notes Reason for block:procedure for pain     

## 2016-06-24 NOTE — Anesthesia Pain Management Evaluation Note (Signed)
  CRNA Pain Management Visit Note  Patient: Jasmin Ellis, 25 y.o., female  "Hello I am a member of the anesthesia team at [facility]. We have an anesthesia team available at all times to provide care throughout the hospital, including epidural management and anesthesia for C-section. I don't know your plan for the delivery whether it a natural birth, water birth, IV sedation, nitrous supplementation, doula or epidural, but we want to meet your pain goals."   1.Was your pain managed to your expectations on prior hospitalizations?   Yes   2.What is your expectation for pain management during this hospitalization?     Epidural  3.How can we help you reach that goal? unsure  Record the patient's initial score and the patient's pain goal.   Pain: 0  Pain Goal: 8 [facility] wants you to be able to say your pain was always managed very well.  Casimer Lanius 06/24/2016

## 2016-06-24 NOTE — H&P (Signed)
  Jasmin Ellis is a 25 y.o. female G3P2 at 20 weeks with EDD: 06/17/16,presenting for induction of labor for Type 2 Diabetes in pregnancy.  Pregnancy followed at [facility] since 19  weeks and remarkable for:  1. Type 2 Diabetes on Metformin 500 mg BID 2. Polyhydramnios 3. Anemia 4. GBS +  OB History    Gravida Para Term Preterm AB Living   3 2 2  0 0 2   SAB TAB Ectopic Multiple Live Births   0 0 0 0 2     Past Medical History:  Diagnosis Date  . Anemia   . Back pain, chronic    s/p fall 8 years ago  . Diabetes mellitus without complication ([place])    type 2  . Fibroids    Past Surgical History:  Procedure Laterality Date  . NO PAST SURGERIES      Family History:   family history includes Diabetes in her father. Social History:    reports that she has never smoked. She has never used smokeless tobacco. She reports that she does not drink alcohol or use drugs.   Prenatal labs: ABO, Rh: O/Positive/-- (04/21 0000) Antibody: Negative (04/21 0000) Rubella: imune RPR: Nonreactive (04/21 0000)  HBsAg: Negative (04/21 0000)  HIV: Non-reactive (04/21 0000)  GBS: Positive (08/18 0000)    Prenatal Transfer Tool  Maternal Diabetes: Yes:  Diabetes Type:  Insulin/Medication controlled Genetic Screening: Declined Maternal Ultrasounds/Referrals: Abnormal:  Findings:   Other:mild polyhydramnios at 38+ weeks with AFI 22, mild pleural effusion noted at 37 weeks remained unchanged Fetal Ultrasounds or other Referrals:  Fetal echo: normal Maternal Substance Abuse:  No Significant Maternal Medications:  Meds include: Other: Metformin 500 mg BID Significant Maternal Lab Results: None     Last menstrual period 09/25/2015, currently breastfeeding.  General Appearance: Alert, appropriate appearance for age. No acute distress HEENT Exam: Grossly normal Chest/Respiratory Exam: Normal chest wall and respirations. Clear to auscultation  Cardiovascular Exam: Regular rate and rhythm. S1,  S2, no murmur Gastrointestinal Exam: soft, non-tender, Uterus gravid with size compatible with GA, Vertex presentation by Leopold's maneuvers Psychiatric Exam: Alert and oriented, appropriate affect  ++++++++++++++++++++++++++++++++++++++++++++++++++++++++++++++++  Vaginal exam: 3/50/-2 vertex AROM clear fluid   Fetal tracings: category 1  ++++++++++++++++++++++++++++++++++++++++++++++++++++++++++++++++   Assessment/Plan:  SIUP at 39 weeks with well controlled Type 2 diabetes for IOL Plan of care reviewed with patient Questions answered   Delsa Bern MD 06/24/2016, 7:25 AM

## 2016-06-25 LAB — CBC
HEMATOCRIT: 31.3 % — AB (ref 36.0–46.0)
Hemoglobin: 10.9 g/dL — ABNORMAL LOW (ref 12.0–15.0)
MCH: 29.3 pg (ref 26.0–34.0)
MCHC: 34.8 g/dL (ref 30.0–36.0)
MCV: 84.1 fL (ref 78.0–100.0)
PLATELETS: 196 10*3/uL (ref 150–400)
RBC: 3.72 MIL/uL — ABNORMAL LOW (ref 3.87–5.11)
RDW: 13.6 % (ref 11.5–15.5)
WBC: 10.5 10*3/uL (ref 4.0–10.5)

## 2016-06-25 LAB — GLUCOSE, RANDOM: Glucose, Bld: 76 mg/dL (ref 65–99)

## 2016-06-25 LAB — GLUCOSE, CAPILLARY: Glucose-Capillary: 132 mg/dL — ABNORMAL HIGH (ref 65–99)

## 2016-06-25 LAB — RPR: RPR Ser Ql: NONREACTIVE

## 2016-06-25 MED ORDER — LIDOCAINE HCL (PF) 1 % IJ SOLN
INTRAMUSCULAR | Status: DC | PRN
  Administered 2016-06-24 (×2): 4 mL

## 2016-06-25 MED ORDER — SULFACETAMIDE SODIUM 10 % OP SOLN
2.0000 [drp] | OPHTHALMIC | Status: DC
  Administered 2016-06-25 – 2016-06-26 (×8): 2 [drp] via OPHTHALMIC
  Filled 2016-06-25: qty 15

## 2016-06-25 MED ORDER — FENTANYL 2.5 MCG/ML BUPIVACAINE 1/10 % EPIDURAL INFUSION (WH - ANES)
INTRAMUSCULAR | Status: DC | PRN
  Administered 2016-06-24: 14 mL/h via EPIDURAL

## 2016-06-25 NOTE — Progress Notes (Signed)
Subjective: Postpartum Day 1: Vaginal delivery, 1st degree perineal laceration, no repair required. Patient up ad lib, reports no syncope or dizziness. Feeding:  Breasts Contraceptive plan:  Undecided  Patient reporting "sty" in left eye that has been there for 2-3 days, but now more swollen--hx recurrent stys, usually uses ATB opthalmic solution with benefit.    Objective: Vital signs in last 24 hours: Temp:  [97.3 F (36.3 C)-98.2 F (36.8 C)] 98.2 F (36.8 C) (09/09 0525) Pulse Rate:  [73-98] 86 (09/09 0525) Resp:  [16-22] 18 (09/09 0525) BP: (99-137)/(46-87) 118/76 (09/09 0525) SpO2:  [100 %] 100 % (09/09 0525) Weight:  [88.9 kg (196 lb)] 88.9 kg (196 lb) (09/08 1124)  Physical Exam:  General: alert  Left eye lower lid is swollen--no drainage noted, but tender to touch.  Normal visual fields. Lochia: appropriate Uterine Fundus: firm Perineum: healing well DVT Evaluation: No evidence of DVT seen on physical exam. Negative Homan's sign.   CBC Latest Ref Rng & Units 06/24/2016 10/31/2015 10/06/2015  WBC 4.0 - 10.5 K/uL 7.0 6.4 7.8  Hemoglobin 12.0 - 15.0 g/dL 10.7(L) 12.2 11.8(L)  Hematocrit 36.0 - 46.0 % 30.9(L) 35.8(L) 34.3(L)  Platelets 150 - 400 K/uL 197 343 321   CBG (last 3)   Recent Labs  06/24/16 1237 06/24/16 1636 06/24/16 2101  GLUCAP 77 74 67     Assessment/Plan: Status post vaginal delivery day 1. Type 2 DM--on Metformin 500 mg po BID Sty in left eye CBC pending. Stable Continue current care. Warm compresses to left eye q 4 hours while awake Per pharmacy consult, sulfacetamide opthalmic solution 2 drops in left eye q 2 hours while awake. Plan for discharge tomorrow  Continue FBS and 2 hour PC CBGs   Maxx Calaway, VICKICNM 06/25/2016, 5:53 AM

## 2016-06-25 NOTE — Anesthesia Postprocedure Evaluation (Signed)
Anesthesia Post Note  Patient: Jasmin Ellis  Procedure(s) Performed: * No procedures listed *  Patient location during evaluation: Mother Baby Anesthesia Type: Epidural Level of consciousness: awake and alert Pain management: satisfactory to patient Vital Signs Assessment: post-procedure vital signs reviewed and stable Respiratory status: respiratory function stable Cardiovascular status: stable Postop Assessment: no headache, no backache, epidural receding, patient able to bend at knees, no signs of nausea or vomiting and adequate PO intake Anesthetic complications: no     Last Vitals:  Vitals:   06/25/16 0130 06/25/16 0525  BP: 117/63 118/76  Pulse: 73 86  Resp: 18 18  Temp: 36.8 C 36.8 C    Last Pain:  Vitals:   06/25/16 0818  TempSrc:   PainSc: 0-No pain   Pain Goal: Patients Stated Pain Goal: 4 (06/24/16 1116)               [name]

## 2016-06-25 NOTE — Lactation Note (Signed)
This note was copied from a baby's chart. Lactation Consultation Note  Patient Name: Boy Keelynn Yew [id number] Date: 06/25/2016 Reason for consult: Initial assessment  Baby 71 hours old. Mom reports that baby is out of the room for an x-ray. Mom states that she is offering breast first and then giving a small amount of formula. Mom reports that she fed her 2 older children this way. Enc mom to continue offering the breast first and then supplement in order to protect her milk supply. Mom given [facility] brochure, aware of OP/BFSG and [place] phone line assistance after D/C. Enc mom to call for assistance as needed.  Maternal Data    Feeding    LATCH Score/Interventions                      Lactation Tools Discussed/Used     Consult Status Consult Status: Follow-up Date: 06/26/16 Follow-up type: In-patient    Andres Labrum 06/25/2016, 5:12 PM

## 2016-06-25 NOTE — Discharge Instructions (Signed)

## 2016-06-25 NOTE — Discharge Summary (Signed)
[facility] Discharge Summary   Patient Name:   Jasmin Ellis DOB:     26-Dec-1990 MRN:     [id number]  Date of Admission:   06/24/2016 Date of Discharge:  06/26/2016  Admitting diagnosis:    39wks, induction Principal Problem:   Vaginal delivery Active Problems:   Type 2 diabetes mellitus affecting pregnancy, antepartum      Discharge diagnosis:    39wks, induction Principal Problem:   Vaginal delivery Active Problems:   Type 2 diabetes mellitus affecting pregnancy, antepartum                                                                   Post partum procedures: NA  Type of Delivery:  SVB  Delivering Provider: Donnel Saxon   Date of Delivery:  06/24/16  Newborn Data:    Live born female  Birth Weight: 7 lb 6.2 oz (3350 g) APGAR: 9, 9  Baby Feeding:   Breast Disposition:   home with mother  Complications:   None  Hospital course:      Induction of Labor With Vaginal Delivery   25 y.o. yo G3P3003 at [redacted]w[redacted]d was admitted to the hospital 06/24/2016 for induction of labor.  Indication for induction: TYPE 2 DM.  Patient had an uncomplicated labor course as follows: Membrane Rupture Time/Date: 12:23 PM ,06/24/2016   Intrapartum Procedures: Episiotomy: None [1]                                         Lacerations:  1st degree [2];Perineal [11]  Patient had delivery of a Viable infant.  Information for the patient's newborn:  Miabelle, Lieberman [id number]  Delivery Method: Vaginal, Spontaneous Delivery (Filed from Delivery Summary)   06/24/2016  Details of delivery can be found in separate delivery note.  Patient had a routine postpartum course. Patient is discharged home 06/26/16.   Physical Exam:   Vitals:   06/25/16 0030 06/25/16 0130 06/25/16 0525 06/25/16 1808  BP: 137/75 117/63 118/76 129/67  Pulse: 83 73 86 95  Resp: 18 18 18 18   Temp: 97.5 F (36.4 C) 98.2 F (36.8 C) 98.2 F (36.8 C) 97.6 F (36.4 C)  TempSrc: Oral Axillary Oral Oral   SpO2: 100% 100% 100%   Weight:      Height:       General: alert Lochia: appropriate Uterine Fundus: firm Incision: Healing well with no significant drainage DVT Evaluation: No evidence of DVT seen on physical exam. Negative Homan's sign.  Labs:  CBC Latest Ref Rng & Units 06/25/2016 06/24/2016 10/31/2015  WBC 4.0 - 10.5 K/uL 10.5 7.0 6.4  Hemoglobin 12.0 - 15.0 g/dL 10.9(L) 10.7(L) 12.2  Hematocrit 36.0 - 46.0 % 31.3(L) 30.9(L) 35.8(L)  Platelets 150 - 400 K/uL 196 197 343   CBG (last 3)   Recent Labs  06/24/16 1636 06/24/16 2101 06/25/16 2109  GLUCAP 74 67 132*      Discharge instruction: per After Visit Summary and "Baby and Me Booklet".  After Visit Meds:    Medication List    TAKE these medications   acetaminophen 325 MG tablet Commonly known as:  TYLENOL Take [id number]  mg by mouth every 6 (six) hours as needed for mild pain or headache.   glucose blood test strip Use as instructed   glucose blood test strip Commonly known as:  ONE TOUCH ULTRA TEST Use as instructed   ibuprofen 600 MG tablet Commonly known as:  ADVIL,MOTRIN Take 1 tablet (600 mg total) by mouth every 6 (six) hours as needed.   metFORMIN 500 MG tablet Commonly known as:  GLUCOPHAGE Take 500 mg by mouth 2 (two) times daily with a meal.   OBSTETRIX EC 29-1 MG Tabs Take 1 tablet by mouth 1 day or 1 dose.   ONETOUCH DELICA LANCETS 99991111 Misc 1 Units by Does not apply route 4 (four) times daily.   onetouch ultrasoft lancets Use as instructed       Diet: carb modified diet  Activity: Advance as tolerated. Pelvic rest for 6 weeks.   Outpatient follow up:6 weeks Follow up Appt:No future appointments. Follow up visit: No Follow-up on file.  Postpartum contraception: Undecided  06/26/2016 Donnel Saxon, CNM

## 2016-06-26 LAB — GLUCOSE, RANDOM: Glucose, Bld: 85 mg/dL (ref 65–99)

## 2016-06-26 LAB — GLUCOSE, CAPILLARY: Glucose-Capillary: 142 mg/dL — ABNORMAL HIGH (ref 65–99)

## 2016-06-26 MED ORDER — IBUPROFEN 600 MG PO TABS: 600.0000 mg | ORAL_TABLET | Freq: Four times a day (QID) | ORAL | 2 refills | Status: DC | PRN

## 2016-06-26 NOTE — Lactation Note (Signed)
This note was copied from a baby's chart. Lactation Consultation Note  Mother denies problems or questions regarding bf. Mom encouraged to feed baby 8-12 times/24 hours and with feeding cues.  Provided mother w/ manual pump. Reviewed engorgement care and monitoring voids/stools.   Patient Name: Jasmin Ellis [id number] Date: 06/26/2016 Reason for consult: Follow-up assessment   Maternal Data    Feeding    LATCH Score/Interventions                      Lactation Tools Discussed/Used     Consult Status Consult Status: Complete    Carlye Grippe 06/26/2016, 11:30 AM

## 2016-07-17 ENCOUNTER — Emergency Department (HOSPITAL_COMMUNITY)
Admission: EM | Admit: 2016-07-17 | Discharge: 2016-07-17 | Disposition: A | Discharge status: Home / Self Care | Payer: BLUE CROSS/BLUE SHIELD | Attending: Emergency Medicine | Admitting: Emergency Medicine

## 2016-07-17 ENCOUNTER — Encounter (HOSPITAL_COMMUNITY): Payer: Self-pay | Admitting: *Deleted

## 2016-07-17 ENCOUNTER — Emergency Department (HOSPITAL_COMMUNITY): Payer: BLUE CROSS/BLUE SHIELD

## 2016-07-17 DIAGNOSIS — Z7984 Long term (current) use of oral hypoglycemic drugs: Secondary | ICD-10-CM | POA: Insufficient documentation

## 2016-07-17 DIAGNOSIS — L03213 Periorbital cellulitis: Secondary | ICD-10-CM

## 2016-07-17 DIAGNOSIS — H05011 Cellulitis of right orbit: Secondary | ICD-10-CM | POA: Insufficient documentation

## 2016-07-17 DIAGNOSIS — E119 Type 2 diabetes mellitus without complications: Secondary | ICD-10-CM | POA: Insufficient documentation

## 2016-07-17 DIAGNOSIS — H5711 Ocular pain, right eye: Secondary | ICD-10-CM | POA: Diagnosis present

## 2016-07-17 LAB — I-STAT CHEM 8, ED
BUN: 10 mg/dL (ref 6–20)
CALCIUM ION: 1.19 mmol/L (ref 1.15–1.40)
CHLORIDE: 102 mmol/L (ref 101–111)
Creatinine, Ser: 0.6 mg/dL (ref 0.44–1.00)
GLUCOSE: 93 mg/dL (ref 65–99)
HCT: 40 % (ref 36.0–46.0)
Hemoglobin: 13.6 g/dL (ref 12.0–15.0)
Potassium: 3.8 mmol/L (ref 3.5–5.1)
Sodium: 141 mmol/L (ref 135–145)
TCO2: 28 mmol/L (ref 0–100)

## 2016-07-17 MED ORDER — CEPHALEXIN 500 MG PO CAPS: 500.0000 mg | ORAL_CAPSULE | Freq: Two times a day (BID) | ORAL | 0 refills | Status: AC

## 2016-07-17 MED ORDER — SULFAMETHOXAZOLE-TRIMETHOPRIM 800-160 MG PO TABS: 1.0000 | ORAL_TABLET | Freq: Two times a day (BID) | ORAL | 0 refills | Status: AC

## 2016-07-17 MED ORDER — IOPAMIDOL (ISOVUE-300) INJECTION 61%
INTRAVENOUS | Status: AC
  Administered 2016-07-17: 75 mL
  Filled 2016-07-17: qty 75

## 2016-07-17 MED ORDER — CLINDAMYCIN HCL 300 MG PO CAPS: 300.0000 mg | ORAL_CAPSULE | Freq: Three times a day (TID) | ORAL | 0 refills | Status: DC

## 2016-07-17 NOTE — Discharge Instructions (Signed)
Please read and follow all provided instructions.  Your diagnoses today include:  1. Periorbital cellulitis of right eye    Tests performed today include: Vital signs. See below for your results today.   Medications prescribed:  Take as prescribed   Home care instructions:  Follow any educational materials contained in this packet.  Follow-up instructions: Please follow-up with your primary care provider for further evaluation of symptoms and treatment   Return instructions:  Please return to the Emergency Department if you do not get better, if you get worse, or new symptoms OR  - Fever (temperature greater than 101.68F)  - Bleeding that does not stop with holding pressure to the area    -Severe pain (please note that you may be more sore the day after your accident)  - Chest Pain  - Difficulty breathing  - Severe nausea or vomiting  - Inability to tolerate food and liquids  - Passing out  - Skin becoming red around your wounds  - Change in mental status (confusion or lethargy)  - New numbness or weakness    Please return if you have any other emergent concerns.  Additional Information:  Your vital signs today were: BP 130/85 (BP Location: Left Arm)    Pulse 77    Temp 98.2 F (36.8 C) (Oral)    Resp 20    Ht 5' 3.5" (1.613 m)    Wt 80 kg    SpO2 99%    Breastfeeding? Yes    BMI 30.75 kg/m  If your blood pressure (BP) was elevated above 135/85 this visit, please have this repeated by your doctor within one month. ---------------

## 2016-07-17 NOTE — ED Provider Notes (Signed)
[facility] DEPT Provider Note   CSN: [id number] Arrival date & time: 07/17/16  1131  By signing my name below, I, Irene Pap, attest that this documentation has been prepared under the direction and in the presence of Shary Decamp, PA-C. Electronically Signed: Irene Pap, ED Scribe. 07/17/16. 11:46 AM.  History   Chief Complaint Chief Complaint  Patient presents with  . Eye Problem   The history is provided by the patient. No language interpreter was used.   HPI Comments: Jasmin Ellis is a 25 y.o. female with a hx of Type II DM who presents to the Emergency Department complaining of right eye swelling onset one week ago. She reports associated eye itching and eye pain. She says that her symptoms first began with the itching and the swelling came later. Pt has worsening pain with extraocular movements. She has been rubbing her eyes when they itch. Notes some drainage underneath eyelid. She denies injury to the eye, fever, chills, nausea, or vomiting. Pt is 3 weeks postpartum and is breastfeeding.   Past Medical History:  Diagnosis Date  . Anemia   . Back pain, chronic    s/p fall 8 years ago  . Diabetes mellitus without complication ([place])    type 2  . Fibroids     Patient Active Problem List   Diagnosis Date Noted  . Type 2 diabetes mellitus affecting pregnancy, antepartum 06/24/2016  . Vaginal delivery 06/24/2016  . Group B streptococcus urinary tract infection affecting pregnancy, antepartum 08/29/2014  . Back pain, chronic     Past Surgical History:  Procedure Laterality Date  . NO PAST SURGERIES      OB History    Gravida Para Term Preterm AB Living   3 3 3  0 0 3   SAB TAB Ectopic Multiple Live Births   0 0 0 0 3       Home Medications    Prior to Admission medications   Medication Sig Start Date End Date Taking? Authorizing Provider  acetaminophen (TYLENOL) 325 MG tablet Take 650 mg by mouth every 6 (six) hours as needed for mild pain or  headache.    Historical Provider, MD  glucose blood (ONE TOUCH ULTRA TEST) test strip Use as instructed 12/22/14   [name], MD  glucose blood test strip Use as instructed 08/11/14   Tanna Savoy Stinson, DO  ibuprofen (ADVIL,MOTRIN) 600 MG tablet Take 1 tablet (600 mg total) by mouth every 6 (six) hours as needed. 06/26/16   Donnel Saxon, CNM  Lancets [facility] ULTRASOFT) lancets Use as instructed 12/22/14   [name], MD  metFORMIN (GLUCOPHAGE) 500 MG tablet Take 500 mg by mouth 2 (two) times daily with a meal.     Historical Provider, MD  [facility] DELICA LANCETS 99991111 MISC 1 Units by Does not apply route 4 (four) times daily. 08/11/14   Truett Mainland, DO  Prenatal Vit-DSS-Fe Cbn-FA (OBSTETRIX EC) 29-1 MG TABS Take 1 tablet by mouth 1 day or 1 dose. 07/29/14   Deirdre Freida Busman, CNM    Family History Family History  Problem Relation Age of Onset  . Diabetes Father     Social History Social History  Substance Use Topics  . Smoking status: Never Smoker  . Smokeless tobacco: Never Used  . Alcohol use No     Allergies   Review of patient's allergies indicates no known allergies.   Review of Systems Review of Systems  ROS reviewed and all are negative for acute change except  as noted in the HPI.  Physical Exam Updated Vital Signs BP 130/85 (BP Location: Left Arm)   Pulse 77   Temp 98.2 F (36.8 C) (Oral)   Resp 20   SpO2 99%   Physical Exam  Constitutional: She is oriented to person, place, and time. Vital signs are normal. She appears well-developed and well-nourished.  HENT:  Head: Normocephalic and atraumatic.  Right Ear: Hearing normal.  Left Ear: Hearing normal.  Eyes: Conjunctivae and lids are normal. Pupils are equal, round, and reactive to light. Lids are everted and swept, no foreign bodies found. Right eye exhibits discharge. Left eye exhibits no chemosis, no discharge, no exudate and no hordeolum. No foreign body present in the left eye. Right conjunctiva is  not injected. Left conjunctiva is not injected. Left conjunctiva has no hemorrhage. No scleral icterus. Left eye exhibits normal extraocular motion and no nystagmus.  No proptosis; pain with EOMs of right eye. Eversion of lower eyelid showed purulence below orbit.    Neck: Normal range of motion. Neck supple.  Cardiovascular: Normal rate and regular rhythm.   Pulmonary/Chest: Effort normal.  Musculoskeletal: Normal range of motion.  Neurological: She is alert and oriented to person, place, and time.  Skin: Skin is warm and dry.  Psychiatric: She has a normal mood and affect. Her speech is normal and behavior is normal. Thought content normal.  Nursing note and vitals reviewed.  ED Treatments / Results  DIAGNOSTIC STUDIES: Oxygen Saturation is 99% on RA, normal by my interpretation.    COORDINATION OF CARE: 11:44 AM-Discussed treatment plan with pt at bedside and pt agreed to plan.    Labs (all labs ordered are listed, but only abnormal results are displayed) Labs Reviewed  I-STAT CHEM 8, ED   EKG  EKG Interpretation None       Radiology Ct Orbits W Contrast  Result Date: 07/17/2016 CLINICAL DATA:  Right orbital redness, swelling for 1 week. Denies blurred vision. EXAM: CT ORBITS WITH CONTRAST TECHNIQUE: Multidetector CT imaging of the orbits was performed following the bolus administration of intravenous contrast. CONTRAST:  6mL ISOVUE-300 IOPAMIDOL (ISOVUE-300) INJECTION 61% COMPARISON:  None. FINDINGS: There is ill-defined soft tissue thickening within the superficial subcutaneous soft tissues along the anterior-inferior margin of the right orbital globe. No circumscribed fluid collection or abscess like collection identified in this area. No retro-orbital fluid or edema. Overlying orbital globe appears normal in density and configuration. Osseous structures about the orbits appear intact and normal in mineralization bilaterally. Paranasal sinuses are clear. IMPRESSION: 1.  Ill-defined soft tissue thickening/edema within the superficial soft tissues along the anterior-inferior margin of the right orbital globe, cellulitis versus phlegmonous. No circumscribed abscess collection. No retro-orbital fluid or edema. Right orbital globe appears normal in density and configuration. 2. Osseous structures about the orbits appear normal. Adjacent paranasal sinuses are clear. Electronically Signed   By: Franki Cabot M.D.   On: 07/17/2016 12:59    Procedures Procedures (including critical care time)  Medications Ordered in ED Medications - No data to display   Initial Impression / Assessment and Plan / ED Course  I have reviewed the triage vital signs and the nursing notes.  Pertinent labs & imaging results that were available during my care of the patient were reviewed by me and considered in my medical decision making (see chart for details).  Clinical Course   Final Clinical Impressions(s) / ED Diagnoses   I have reviewed and evaluated the relevant laboratory values I have reviewed  and evaluated the relevant imaging studies. I have reviewed the relevant previous healthcare records.I obtained HPI from historian. Patient discussed with supervising physician  ED Course:  Assessment:  Pt is a 25yF who presents with right eye swelling x 1 week. No fevers. No trauma to area. Notes itching and pain with EOM. Discharge noted below lower eyelid underneath orbit with eversion. On exam, pt in NAD. Nontoxic/nonseptic appearing. VSS. Afebrile. Right eye with no proptosis. Pain on EOM. No injected sclera. CT Orbits showed no evidence of abscess or orbital cellulitis. Will treat as periorbital cellulitis with keflex and bactrim due to patient still breastfeeding. Plan is to [place]. Strict return precautions given. At time of discharge, Patient is in no acute distress. Vital Signs are stable. Patient is able to ambulate. Patient able to tolerate PO.    Disposition/Plan:  DC  Home Additional Verbal discharge instructions given and discussed with patient.  Pt Instructed to f/u with PCP in the next week for evaluation and treatment of symptoms. Return precautions given Pt acknowledges and agrees with plan  Supervising Physician Isla Pence, MD   Final diagnoses:  Periorbital cellulitis of right eye   I personally performed the services described in this documentation, which was scribed in my presence. The recorded information has been reviewed and is accurate.   New Prescriptions New Prescriptions   No medications on file      Shary Decamp, PA-C 07/17/16 [place], MD 07/17/16 1348

## 2016-07-17 NOTE — ED Triage Notes (Signed)
1 week ago pt reports RT eye itching,pain and swelling to tissue surrounding RT eye. Pt reports her RT eye hurts when moving RT eye .

## 2016-10-03 IMAGING — US US OB FOLLOW-UP
1 series · 13 of 28 positions shown · non-contrast
Comparison: none

[Series 1: us ob follow-up · 0.21mm/px · 29 acquisitions, 13 frames shown]
[im 2/29]
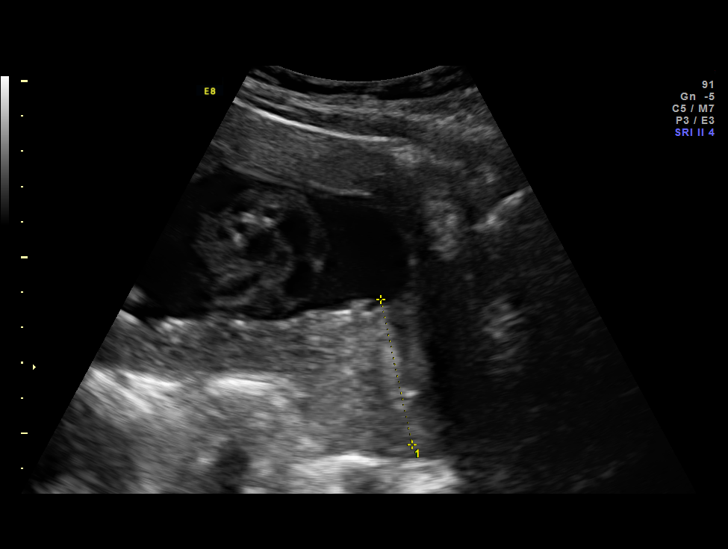
[im 4/29]
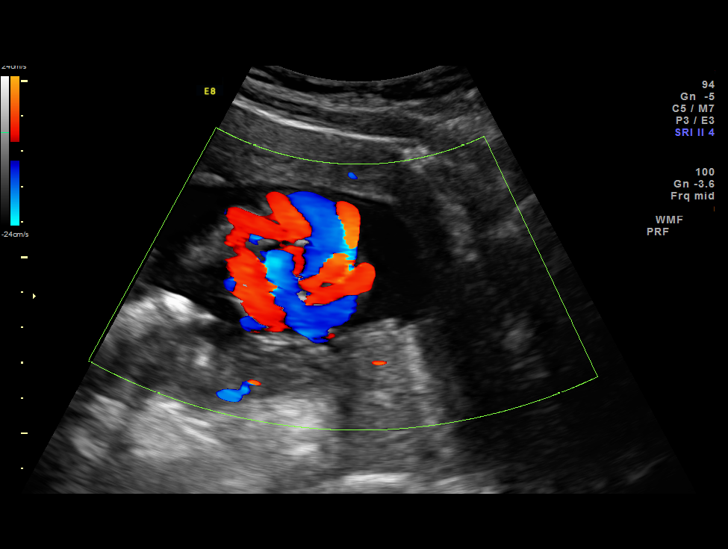
[im 6/29]
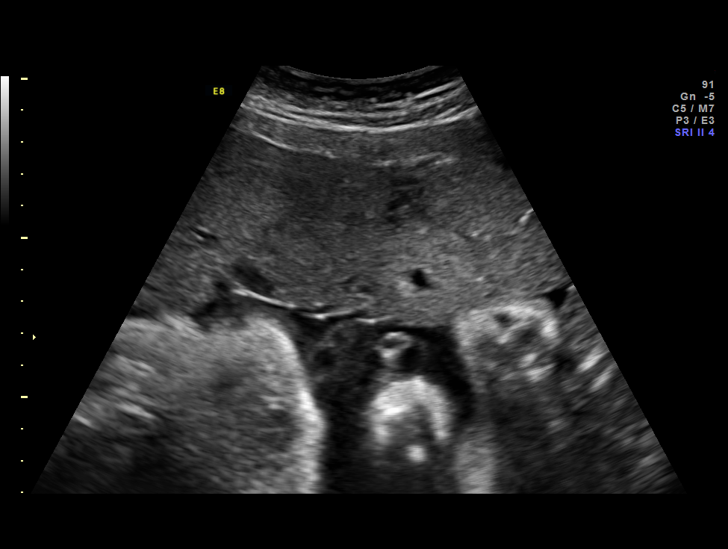
[im 8/29]
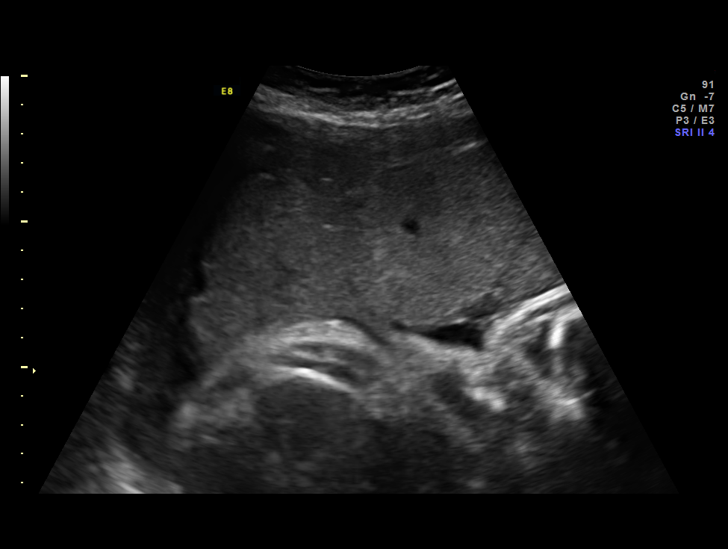
[im 10/29]
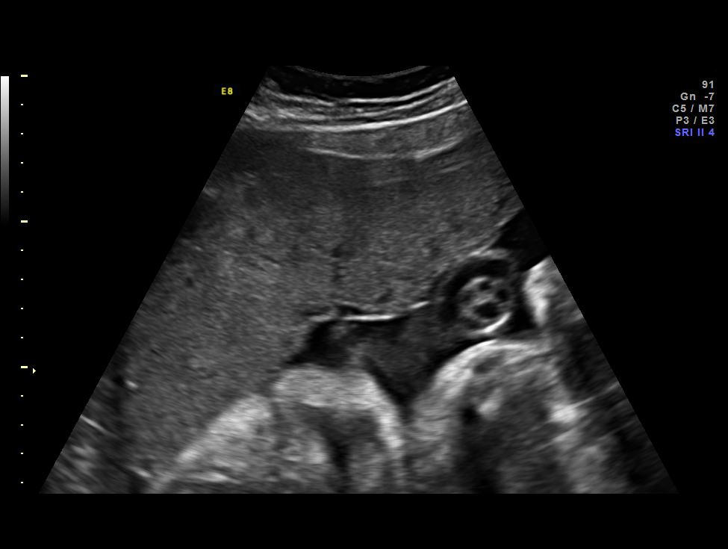
[im 12/29]
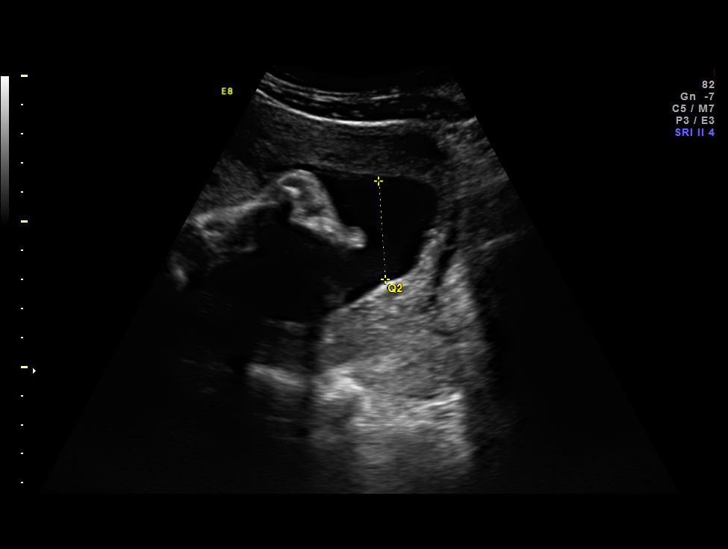
[im 15/29]
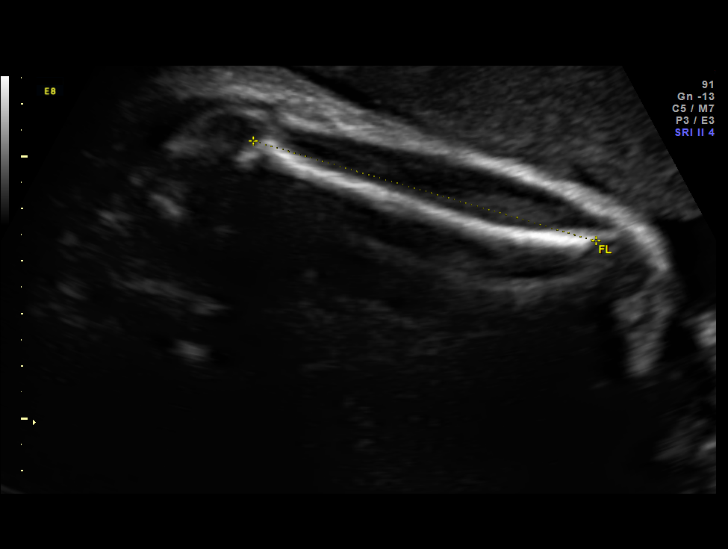
[im 17/29]
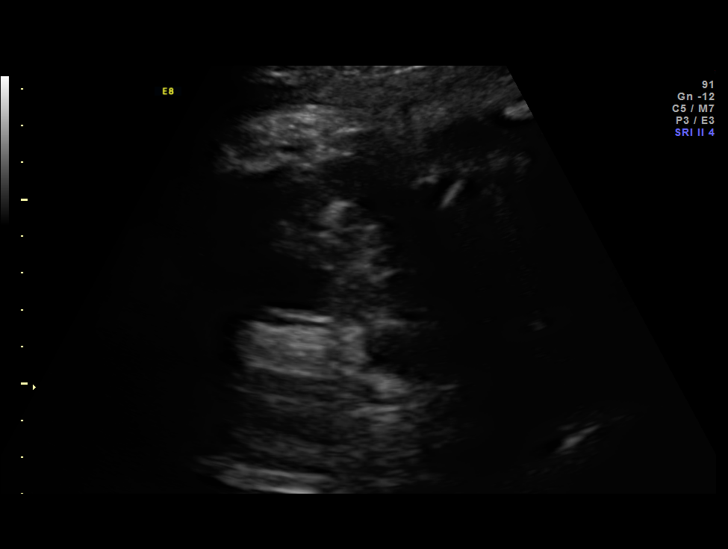
[im 19/29]
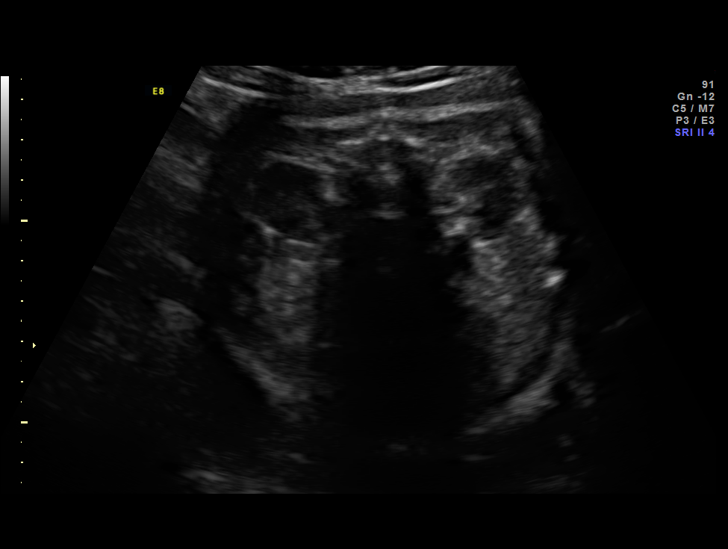
[im 21/29]
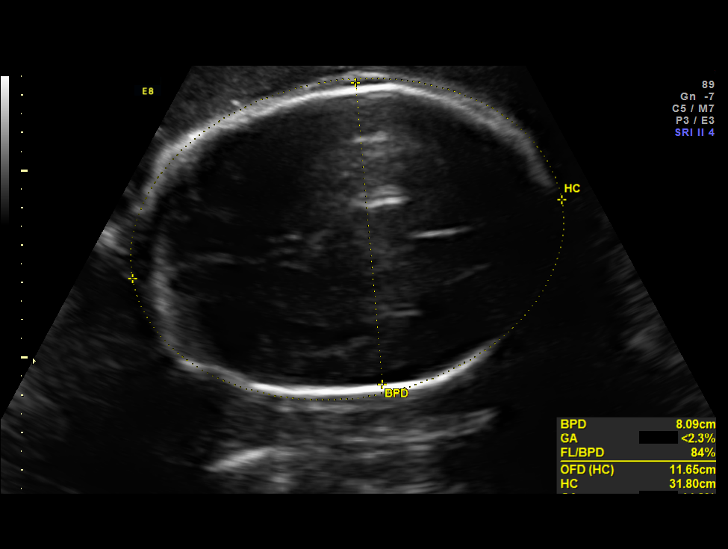
[im 23/29]
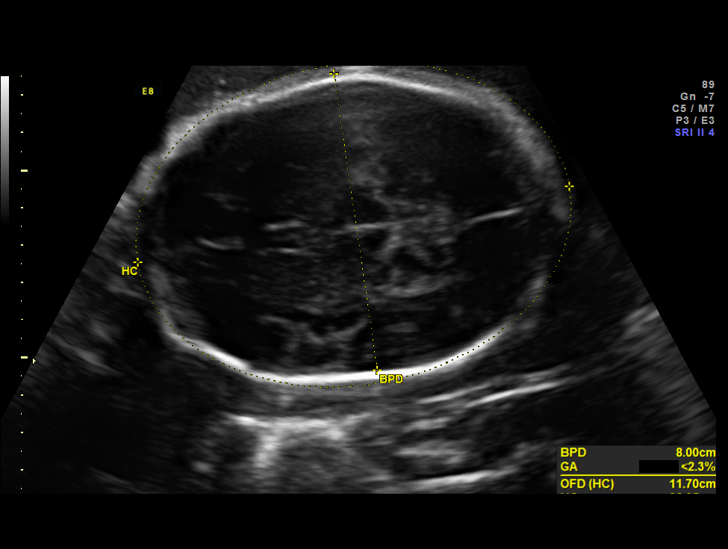
[im 25/29]
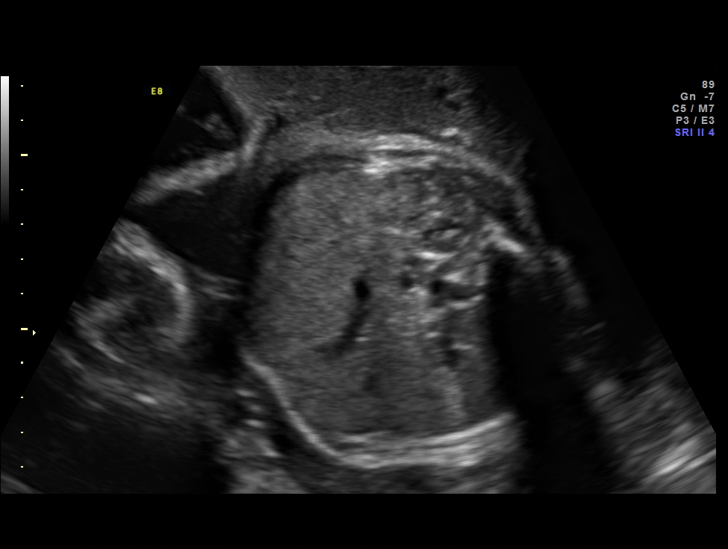
[im 27/29]
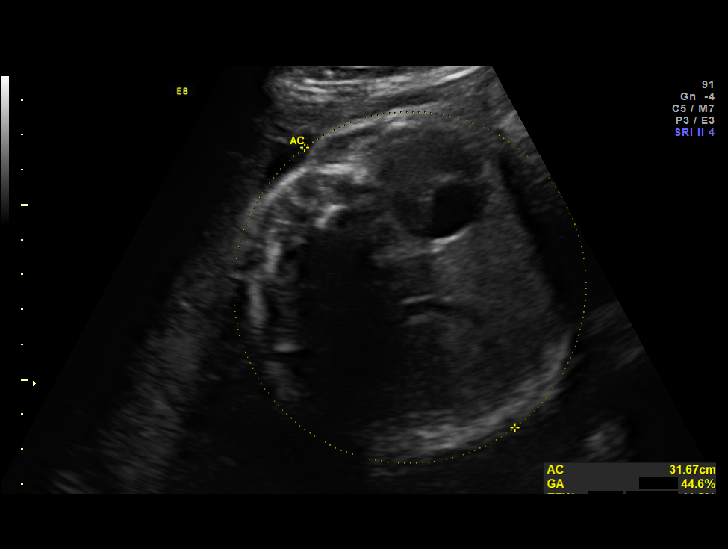

[13 of 28 positions shown; findings below may reference images not displayed]

OBSTETRICS REPORT
(Signed Final 02/16/2015 [DATE])

Faculty Physician
Service(s) Provided

US OB FOLLOW UP                                       76816.1
Indications

Pre-existing diabetes, type 2, in pregnancy, third
trimester (on metformin)
Uterine fibroids
36 weeks gestation of pregnancy
Fetal Evaluation

Num Of Fetuses:    1
Fetal Heart Rate:  147                          bpm
Cardiac Activity:  Observed
Presentation:      Transverse, head to
maternal left
Placenta:          Anterior, above cervical os
P. Cord            Previously Visualized
Insertion:

Amniotic Fluid
AFI FV:      Subjectively within normal limits
AFI Sum:     18.57   cm       70  %Tile     Larg Pckt:    5.41  cm
RUQ:   5.33    cm   RLQ:    4.44   cm    LUQ:   3.39    cm   LLQ:    5.41   cm
Biometry

BPD:     80.2  mm     G. Age:  32w 1d                CI:         68.7   70 - 86
OFD:    116.7  mm                                    FL/HC:      21.5   20.1 -
22.1
HC:     318.8  mm     G. Age:  35w 6d       17  %    HC/AC:      1.02   0.93 -
1.11
AC:     313.7  mm     G. Age:  35w 2d       37  %    FL/BPD:     85.3   71 - 87
FL:      68.4  mm     G. Age:  35w 1d       22  %    FL/AC:      21.8   20 - 24

Est. FW:    7383  gm    5 lb 10 oz      35  %
Gestational Age

U/S Today:     34w 4d                                        EDD:   03/26/15
Best:          36w 1d     Det. By:  Early Ultrasound         EDD:   03/15/15
(07/29/14)
Anatomy
Cranium:          Previously seen        Aortic Arch:      Previously seen
Fetal Cavum:      Previously seen        Ductal Arch:      Previously seen
Ventricles:       Appears normal         Diaphragm:        Previously seen
Choroid Plexus:   Previously seen        Stomach:          Appears normal, left
sided
Cerebellum:       Previously seen        Abdomen:          Previously seen
Posterior Fossa:  Previously seen        Abdominal Wall:   Previously seen
Nuchal Fold:      Previously seen        Cord Vessels:     Previously seen
Face:             Orbits and profile     Kidneys:          Appear normal
previously seen
Lips:             Previously seen        Bladder:          Appears normal
Heart:            Previously seen        Spine:            Previously seen
RVOT:             Previously seen        Lower             Previously seen
Extremities:
LVOT:             Previously seen        Upper             Previously seen
Extremities:

Other:  Male gender.
Cervix Uterus Adnexa

Cervix:       Not visualized (advanced GA >51wks)
Impression

SIUP at 36+1 weeks, DM
Normal interval anatomy;
Normal amniotic fluid volume
Appropriate interval growth with EFW at the 35th %tile
Recommendations

Continue antenatal testing and fetal Arissa Billiot.
Recommend delivery at 39 weeks.

## 2017-10-02 ENCOUNTER — Encounter (HOSPITAL_COMMUNITY): Payer: Self-pay | Admitting: Neurology

## 2017-10-02 ENCOUNTER — Other Ambulatory Visit: Payer: Self-pay

## 2017-10-02 ENCOUNTER — Emergency Department (HOSPITAL_COMMUNITY)
Admission: EM | Admit: 2017-10-02 | Discharge: 2017-10-02 | Disposition: A | Discharge status: Home / Self Care | Payer: BLUE CROSS/BLUE SHIELD | Attending: Emergency Medicine | Admitting: Emergency Medicine

## 2017-10-02 DIAGNOSIS — N898 Other specified noninflammatory disorders of vagina: Secondary | ICD-10-CM

## 2017-10-02 DIAGNOSIS — R1030 Lower abdominal pain, unspecified: Secondary | ICD-10-CM | POA: Insufficient documentation

## 2017-10-02 DIAGNOSIS — E119 Type 2 diabetes mellitus without complications: Secondary | ICD-10-CM | POA: Insufficient documentation

## 2017-10-02 DIAGNOSIS — Z79899 Other long term (current) drug therapy: Secondary | ICD-10-CM | POA: Insufficient documentation

## 2017-10-02 LAB — URINALYSIS, ROUTINE W REFLEX MICROSCOPIC
Bilirubin Urine: NEGATIVE
Glucose, UA: NEGATIVE mg/dL
Hgb urine dipstick: NEGATIVE
Ketones, ur: NEGATIVE mg/dL
Leukocytes, UA: NEGATIVE
Nitrite: NEGATIVE
Protein, ur: NEGATIVE mg/dL
Specific Gravity, Urine: 1.015 (ref 1.005–1.030)
pH: 6 (ref 5.0–8.0)

## 2017-10-02 LAB — WET PREP, GENITAL
Clue Cells Wet Prep HPF POC: NONE SEEN
Sperm: NONE SEEN
Trich, Wet Prep: NONE SEEN
Yeast Wet Prep HPF POC: NONE SEEN

## 2017-10-02 LAB — CBG MONITORING, ED: Glucose-Capillary: 59 mg/dL — ABNORMAL LOW (ref 65–99)

## 2017-10-02 LAB — POC URINE PREG, ED: PREG TEST UR: NEGATIVE

## 2017-10-02 MED ORDER — FLUCONAZOLE 150 MG PO TABS
150.0000 mg | ORAL_TABLET | Freq: Once | ORAL | Status: AC
  Administered 2017-10-02: 150 mg via ORAL
  Filled 2017-10-02 (×2): qty 1

## 2017-10-02 MED ORDER — FLUCONAZOLE 150 MG PO TABS: 150.0000 mg | ORAL_TABLET | Freq: Every day | ORAL | 0 refills | Status: AC

## 2017-10-02 NOTE — ED Notes (Signed)
Gave pt orange Juice

## 2017-10-02 NOTE — ED Provider Notes (Signed)
[place] EMERGENCY DEPARTMENT Provider Note   CSN: [id number] Arrival date & time: 10/02/17  1138     History   Chief Complaint Chief Complaint  Patient presents with  . Vaginal Itching    HPI Jasmin Ellis is a 26 y.o. female with history of type 2 diabetes who presents with a one-week history of vaginal itching and vaginal discharge.  Patient denies any concern for STD exposure, she only has sexual intercourse with her husband.  She reports having lower abdominal pain for a few minutes 2 days ago, however it resolved after Tylenol.  She has not have it since.  She has had normal menstrual cycle, last being 09/09/2017.  She denies any abnormal vaginal bleeding.  She has not changed any soaps or underwear recently. She denies urinary symptoms.  HPI  Past Medical History:  Diagnosis Date  . Anemia   . Back pain, chronic    s/p fall 8 years ago  . Diabetes mellitus without complication ([place])    type 2  . Fibroids     Patient Active Problem List   Diagnosis Date Noted  . Type 2 diabetes mellitus affecting pregnancy, antepartum 06/24/2016  . Vaginal delivery 06/24/2016  . Group B Streptococcus urinary tract infection affecting pregnancy, antepartum 08/29/2014  . Back pain, chronic     Past Surgical History:  Procedure Laterality Date  . NO PAST SURGERIES      OB History    Gravida Para Term Preterm AB Living   3 3 3  0 0 3   SAB TAB Ectopic Multiple Live Births   0 0 0 0 3       Home Medications    Prior to Admission medications   Medication Sig Start Date End Date Taking? Authorizing Provider  ibuprofen (ADVIL,MOTRIN) 600 MG tablet Take 1 tablet (600 mg total) by mouth every 6 (six) hours as needed. 06/26/16  Yes Donnel Saxon, CNM  fluconazole (DIFLUCAN) 150 MG tablet Take 1 tablet (150 mg total) by mouth daily for 1 day. If your symptoms are still present in 3 days. 10/02/17 10/03/17  Frederica Kuster, PA-C  glucose blood (ONE TOUCH  ULTRA TEST) test strip Use as instructed Patient not taking: Reported on 10/02/2017 12/22/14   [name], MD  glucose blood test strip Use as instructed Patient not taking: Reported on 10/02/2017 08/11/14   Truett Mainland, DO  Lancets [facility] ULTRASOFT) lancets Use as instructed Patient not taking: Reported on 10/02/2017 12/22/14   [name], MD  [facility] DELICA LANCETS 54M MISC 1 Units by Does not apply route 4 (four) times daily. Patient not taking: Reported on 10/02/2017 08/11/14   Truett Mainland, DO  Prenatal Vit-DSS-Fe Cbn-FA (OBSTETRIX EC) 29-1 MG TABS Take 1 tablet by mouth 1 day or 1 dose. Patient not taking: Reported on 10/02/2017 07/29/14   Lorene Dy, CNM    Family History Family History  Problem Relation Age of Onset  . Diabetes Father     Social History Social History   Tobacco Use  . Smoking status: Never Smoker  . Smokeless tobacco: Never Used  Substance Use Topics  . Alcohol use: No  . Drug use: No     Allergies   Patient has no known allergies.   Review of Systems Review of Systems  Constitutional: Negative for chills and fever.  HENT: Negative for facial swelling and sore throat.   Respiratory: Negative for shortness of breath.   Cardiovascular: Negative for chest  pain.  Gastrointestinal: Negative for abdominal pain, nausea and vomiting.  Genitourinary: Positive for vaginal discharge. Negative for dysuria and vaginal bleeding.  Musculoskeletal: Negative for back pain.  Skin: Negative for rash and wound.  Neurological: Negative for headaches.  Psychiatric/Behavioral: The patient is not nervous/anxious.      Physical Exam Updated Vital Signs BP (!) 138/98   Pulse 73   Temp 98.3 F (36.8 C) (Oral)   Resp 16   Ht 5\' 4"  (1.626 m)   LMP 09/10/2017   SpO2 100%   BMI 30.27 kg/m   Physical Exam  Constitutional: She appears well-developed and well-nourished. No distress.  HENT:  Head: Normocephalic and atraumatic.    Mouth/Throat: Oropharynx is clear and moist. No oropharyngeal exudate.  Eyes: Conjunctivae are normal. Pupils are equal, round, and reactive to light. Right eye exhibits no discharge. Left eye exhibits no discharge. No scleral icterus.  Neck: Normal range of motion. Neck supple. No thyromegaly present.  Cardiovascular: Normal rate, regular rhythm, normal heart sounds and intact distal pulses. Exam reveals no gallop and no friction rub.  No murmur heard. Pulmonary/Chest: Effort normal and breath sounds normal. No stridor. No respiratory distress. She has no wheezes. She has no rales.  Abdominal: Soft. Bowel sounds are normal. She exhibits no distension. There is no tenderness. There is no rebound and no guarding.  Genitourinary: Pelvic exam was performed with patient supine. There is no rash, tenderness or lesion on the right labia. There is no rash, tenderness or lesion on the left labia. Cervix exhibits discharge (clear, could be physiologic). Cervix exhibits no motion tenderness. Right adnexum displays no mass and no tenderness. Left adnexum displays no mass and no tenderness. No tenderness in the vagina. Vaginal discharge (white, milky) found.  Genitourinary Comments: Chaperone present Patient does have dry, white, yeast-like collection between labia majora and minora  Musculoskeletal: She exhibits no edema.  Lymphadenopathy:    She has no cervical adenopathy.  Neurological: She is alert. Coordination normal.  Skin: Skin is warm and dry. No rash noted. She is not diaphoretic. No pallor.  Psychiatric: She has a normal mood and affect.  Nursing note and vitals reviewed.    ED Treatments / Results  Labs (all labs ordered are listed, but only abnormal results are displayed) Labs Reviewed  WET PREP, GENITAL - Abnormal; Notable for the following components:      Result Value   WBC, Wet Prep HPF POC FEW (*)    All other components within normal limits  URINALYSIS, ROUTINE W REFLEX  MICROSCOPIC - Abnormal; Notable for the following components:   Color, Urine STRAW (*)    All other components within normal limits  CBG MONITORING, ED - Abnormal; Notable for the following components:   Glucose-Capillary 59 (*)    All other components within normal limits  POC URINE PREG, ED  GC/CHLAMYDIA PROBE AMP ([place]) NOT AT [facility]    EKG  EKG Interpretation None       Radiology No results found.  Procedures Procedures (including critical care time)  Medications Ordered in ED Medications  fluconazole (DIFLUCAN) tablet 150 mg (150 mg Oral Given 10/02/17 1720)     Initial Impression / Assessment and Plan / ED Course  I have reviewed the triage vital signs and the nursing notes.  Pertinent labs & imaging results that were available during my care of the patient were reviewed by me and considered in my medical decision making (see chart for details).  Patient with suspected vaginal candidiasis.  Although wet prep is negative, yeastlike substance found externally may be the cause of patient's symptoms.  No rash or lesions otherwise to account for patient's symptoms.  Will treat with Diflucan.  GC/chlamydia sent and pending.  UA is negative.  Urine pregnancy negative.  Patient advised to follow-up with OB/GYN for further evaluation and treatment.  Return precautions discussed.  Patient understands and agrees with plan.  Patient vitals stable and discharged in satisfactory condition.  Final Clinical Impressions(s) / ED Diagnoses   Final diagnoses:  Vaginal irritation    ED Discharge Orders        Ordered    fluconazole (DIFLUCAN) 150 MG tablet  Daily     10/02/17 [address] [place] 10/03/17 1623    Malvin Johns, MD 10/13/17 1517

## 2017-10-02 NOTE — Discharge Instructions (Signed)
Medications: Diflucan  Treatment and follow-up: Take Diflucan in 3 days if your itching and discharge are still present.  You will be called in 3 days if you test positive for gonorrhea or chlamydia.  You will need to follow-up at the health department for treatment in this case.  If positive, your sexual partner will need to be treated as well.  Please follow-up with the women's outpatient clinic (information below) if your symptoms are not improving.  Please return to the emergency department if you develop any new or worsening symptoms.

## 2017-10-02 NOTE — ED Triage Notes (Signed)
Pt reports vaginal itching, d/c x 1 week. LMP 09/10/17.

## 2017-10-02 NOTE — ED Notes (Signed)
Pt stable and states understanding d/s instructions and follow up

## 2017-10-03 LAB — GC/CHLAMYDIA PROBE AMP (~~LOC~~) NOT AT ARMC
Chlamydia: NEGATIVE
Neisseria Gonorrhea: NEGATIVE

## 2017-11-28 ENCOUNTER — Encounter (HOSPITAL_COMMUNITY): Payer: Self-pay | Admitting: Family Medicine

## 2017-11-28 ENCOUNTER — Ambulatory Visit (HOSPITAL_COMMUNITY)
Admission: EM | Admit: 2017-11-28 | Discharge: 2017-11-28 | Disposition: A | Discharge status: Home / Self Care | Payer: BLUE CROSS/BLUE SHIELD | Attending: Family Medicine | Admitting: Family Medicine

## 2017-11-28 DIAGNOSIS — R197 Diarrhea, unspecified: Secondary | ICD-10-CM

## 2017-11-28 LAB — GLUCOSE, CAPILLARY: Glucose-Capillary: 83 mg/dL (ref 65–99)

## 2017-11-28 NOTE — Discharge Instructions (Signed)
Please do your best to ensure adequate fluid intake in order to avoid dehydration. If you find that you are unable to tolerate drinking fluids regularly please proceed to the Emergency Department for evaluation. ° ° °

## 2017-11-28 NOTE — Medical Student Note (Signed)
[facility] Statistician Note For educational purposes for Medical, PA and NP students only and not part of the legal medical record.   CSN: [id number] Arrival date & time: 11/28/17  1227     History   Chief Complaint Chief Complaint  Patient presents with  . Diarrhea  . Dizziness    HPI Jasmin Ellis is a 27 y.o. female.  HPI 27 year old African female presents with watery diarrhea, generalized cramping abdominal pain and weakness since last night. States she has had 6-7 diarrheal episodes since this morning, denies mucous or blood in her stool. Had an episode last night where she felt so weak while standing that she thought she was going to pass out so she lowered herself to the ground. Took a juice home remedy for diarrhea without relief. Denies loss of consciousness, syncope, head trauma, visual changes, headache, palpitations, chest pain. Still drinking fluids and denies nausea/vomiting. Kids and husband are asymptomatic. No recent travel or dietary changes. Has a history of diabetes and was previously on medication but stopped it a year ago on her own, has a PCP appointment on 12/01/17 to reestablish care. Denies numbness/tingling, polydipsia, confusion, or open wounds.   History reviewed. No pertinent past medical history.  There are no active problems to display for this patient.   History reviewed. No pertinent surgical history.  OB History    No data available       Home Medications    Prior to Admission medications   Not on File    Family History History reviewed. No pertinent family history.  Social History Social History   Tobacco Use  . Smoking status: Never Smoker  Substance Use Topics  . Alcohol use: Not on file  . Drug use: Not on file     Allergies   Patient has no known allergies.   Review of Systems Review of Systems  Constitutional: Positive for appetite change and fatigue. Negative for chills, diaphoresis and  fever.  Respiratory: Negative for cough, chest tightness and shortness of breath.   Cardiovascular: Negative for chest pain, palpitations and leg swelling.  Gastrointestinal: Positive for abdominal pain and diarrhea. Negative for abdominal distention, anal bleeding, blood in stool, constipation, nausea, rectal pain and vomiting.  Endocrine: Negative for polydipsia and polyuria.  Genitourinary: Negative for decreased urine volume and hematuria.  Musculoskeletal: Negative for myalgias.  Skin: Negative for pallor and rash.  Neurological: Positive for weakness and light-headedness. Negative for headaches.  Psychiatric/Behavioral: Negative for confusion.     Physical Exam Updated Vital Signs BP 122/76   Pulse 83   Temp 97.9 F (36.6 C)   Resp 18   LMP 11/07/2017   SpO2 100%   Physical Exam  Constitutional: She appears well-developed and well-nourished. No distress.  Appears fatigued  HENT:  Head: Normocephalic and atraumatic.  Eyes: Conjunctivae are normal. Pupils are equal, round, and reactive to light. Right eye exhibits no discharge. Left eye exhibits no discharge. No scleral icterus.  Neck: Neck supple.  Cardiovascular: Normal rate and regular rhythm.  No murmur heard. Pulmonary/Chest: Effort normal and breath sounds normal. No respiratory distress.  Abdominal: Soft. Bowel sounds are normal. She exhibits no distension. There is tenderness. There is no rebound and no guarding.  Mild tenderness with deep palpation around umbilicus. Negative Murphy's and McBurney's.   Musculoskeletal: She exhibits no edema.  Neurological: She is alert.  Skin: Skin is warm and dry. Capillary refill takes less than 2 seconds.  Psychiatric:  She has a normal mood and affect.  Nursing note and vitals reviewed.    ED Treatments / Results  Labs (all labs ordered are listed, but only abnormal results are displayed) Labs Reviewed - No data to display  EKG  EKG Interpretation None        Radiology No results found.  Procedures Procedures (including critical care time)  Medications Ordered in ED Medications - No data to display   Initial Impression / Assessment and Plan / ED Course  I have reviewed the triage vital signs and the nursing notes.  Pertinent labs & imaging results that were available during my care of the patient were reviewed by me and considered in my medical decision making (see chart for details).     Viral gastroenteritis Signs and symptoms are consistent with a viral gastroenteric infection. She is not hypotensive or tachycardic and she is afebrile. She does not appear to be dehydrated or show any indication of requiring IV fluids. She continues to tolerate PO well. Counseled to drink plenty of fluids and bland foods and rest for the next couple days. POC glucose normal, not hypoglycemic. Follow up with her PCP appointment on Friday to manage her diabetes. If her symptoms worsen or do not improve in the next 3 days or she feels presyncopal, cannot tolerate PO fluids, or develops intractable fever/chills, return to clinic or ED for further evaluation. Patient is agreeable to plan.   Final Clinical Impressions(s) / ED Diagnoses   Final diagnoses:  None    New Prescriptions New Prescriptions   No medications on file

## 2017-11-28 NOTE — ED Triage Notes (Signed)
Pt here for abd cramping, diarrhea and dizziness since last night.

## 2017-11-29 ENCOUNTER — Encounter (HOSPITAL_COMMUNITY): Payer: Self-pay | Admitting: Neurology

## 2017-11-29 NOTE — ED Provider Notes (Signed)
[place] [place]   [id number] 11/28/17 Arrival Time: 1227  ASSESSMENT & PLAN:  1. Diarrhea, unspecified type    Signs and symptoms are consistent with a viral gastroenteric infection. She is not hypotensive or tachycardic and she is afebrile. She does not appear to be dehydrated or show any indication of requiring IV fluids. She continues to tolerate PO well. Counseled to drink plenty of fluids and bland foods and rest for the next couple days. POC glucose normal, not hypoglycemic. Follow up with her PCP appointment on Friday to manage her diabetes. If her symptoms worsen or do not improve in the next 3 days or she feels presyncopal, cannot tolerate PO fluids, or develops intractable fever/chills, return to clinic or ED for further evaluation. Patient is agreeable to plan.   Reviewed expectations re: course of current medical issues. Questions answered. Outlined signs and symptoms indicating need for more acute intervention. Patient verbalized understanding. After Visit Summary given.   SUBJECTIVE: History from: patient.  Jasmin Ellis is a 27 y.o. female who presents with watery diarrhea, generalized cramping abdominal pain and weakness since last night. States she has had 6-7 diarrheal episodes since this morning, denies mucous or blood in her stool. Had an episode last night where she felt so weak while standing that she thought she was going to pass out so she lowered herself to the ground. Took a juice home remedy for diarrhea without relief. Denies loss of consciousness, syncope, head trauma, visual changes, headache, palpitations, chest pain. Still drinking fluids and denies nausea/vomiting. Kids and husband are asymptomatic. No recent travel or dietary changes. Has a history of diabetes and was previously on medication but stopped it a year ago on her own, has a PCP appointment on 12/01/17 to reestablish care. Denies numbness/tingling, polydipsia, confusion, or open wounds.    Patient's last menstrual period was 11/07/2017.  Past Surgical History:  Procedure Laterality Date  . NO PAST SURGERIES      ROS: As per HPI.  OBJECTIVE:  Vitals:   11/28/17 1251  BP: 122/76  Pulse: 83  Resp: 18  Temp: 97.9 F (36.6 C)  SpO2: 100%    Constitutional: She appears well-developed and well-nourished. No distress.  Appears fatigued  HENT:  Head: Normocephalic and atraumatic.  Eyes: Conjunctivae are normal. Pupils are equal, round, and reactive to light. Right eye exhibits no discharge. Left eye exhibits no discharge. No scleral icterus.  Neck: Neck supple.  Cardiovascular: Normal rate and regular rhythm.  No murmur heard. Pulmonary/Chest: Effort normal and breath sounds normal. No respiratory distress.  Abdominal: Soft. Bowel sounds are normal. She exhibits no distension. There is tenderness. There is no rebound and no guarding.  Mild tenderness with deep palpation around umbilicus. Negative Murphy's and McBurney's.   Musculoskeletal: She exhibits no edema.  Neurological: She is alert.  Skin: Skin is warm and dry. Capillary refill takes less than 2 seconds.  Psychiatric: She has a normal mood and affect.  Nursing note and vitals reviewed.  Labs: Results for orders placed or performed during the hospital encounter of 11/28/17  Glucose, capillary  Result Value Ref Range   Glucose-Capillary 83 65 - 99 mg/dL   Labs Reviewed  GLUCOSE, CAPILLARY    No Known Allergies  Past Medical History:  Diagnosis Date  . Anemia   . Back pain, chronic    s/p fall 8 years ago  . Diabetes mellitus without complication ([place])    type 2  . Fibroids    Social History   Socioeconomic History  . Marital status: Married    Spouse name: Not on file  . Number of children: Not on file  . Years of education: Not on file  . Highest education level: Not on file  Social Needs  . Financial resource strain: Not on file  . Food  insecurity - worry: Not on file  . Food insecurity - inability: Not on file  . Transportation needs - medical: Not on file  . Transportation needs - non-medical: Not on file  Occupational History  . Not on file  Tobacco Use  . Smoking status: Never Smoker  Substance and Sexual Activity  . Alcohol use: No  . Drug use: No  . Sexual activity: Yes    Birth control/protection: Condom  Other Topics Concern  . Not on file  Social History Narrative   ** Merged History Encounter **       Family History  Problem Relation Age of Onset  . Diabetes Father      Vanessa Kick, MD 11/29/17 [phone]

## 2017-12-01 ENCOUNTER — Encounter: Payer: Self-pay | Admitting: Family Medicine

## 2017-12-01 ENCOUNTER — Ambulatory Visit (INDEPENDENT_AMBULATORY_CARE_PROVIDER_SITE_OTHER): Payer: BLUE CROSS/BLUE SHIELD | Admitting: Family Medicine

## 2017-12-01 VITALS — BP 122/78 | HR 76 | Temp 98.0°F | Ht 64.0 in | Wt 179.2 lb

## 2017-12-01 DIAGNOSIS — E119 Type 2 diabetes mellitus without complications: Secondary | ICD-10-CM | POA: Diagnosis not present

## 2017-12-01 NOTE — Assessment & Plan Note (Addendum)
Could possibly be gestational diabetes that patient did not understand to follow up on due to language barrier, will check A1c today. Recent CBG in ED was 83. Does not check at home. No recent A1c recorded.

## 2017-12-01 NOTE — Patient Instructions (Addendum)
Thank you for coming to see me today. It was a pleasure! Today we talked about:   Your Diabetes. I will test your hemoglobin A1c today which tells [place] your blood sugar average over the past 3 months.   You are not due for any health exams at this time.   Please follow-up with me in 1 year or sooner as needed. I will call you with your test results when we get them back.  If you have any questions or concerns, please do not hesitate to call the office at [phone].  Take Care,   [place] Aysia Lowder, DO

## 2017-12-01 NOTE — Progress Notes (Signed)
   Subjective:    Patient ID: Jasmin Ellis, female    DOB: 06-Jun-1991, 27 y.o.   MRN: [id number]   CC: establishing care  HPI: no concerns today  Diabetes: -Patient reports that she has had diabetes since 2016. She got it when pregnant and had it afterwards as well - Reports that she has been controlling it with diet alone - Father had diabetes - Does not test at home, as it hurt her fingers - She tries to watch her diet closely  Healthcare Maintenance: - Does not need pap-smear - Will refer to optho because patient has never seen eye doctor   Review of Systems  Constitutional: Negative for chills and fever.  HENT: Negative for congestion.   Eyes: Negative for blurred vision and double vision.  Respiratory: Negative for shortness of breath.   Cardiovascular: Negative for chest pain, palpitations and leg swelling.  Gastrointestinal: Negative for abdominal pain, diarrhea and vomiting.  Genitourinary: Negative for dysuria.  Musculoskeletal: Positive for back pain. Negative for joint pain.  Neurological: Positive for headaches. Negative for weakness.       HA sometimes  Psychiatric/Behavioral: Negative for depression.   Patient Active Problem List   Diagnosis Date Noted  . Type 2 diabetes mellitus without complication, without long-term current use of insulin ([place]) 12/01/2017  . Back pain, chronic      Family History  Problem Relation Age of Onset  . Diabetes Father     Past Medical History:  Diagnosis Date  . Anemia   . Back pain, chronic    s/p fall 8 years ago  . Diabetes mellitus without complication ([place])    type 2  . Fibroids     Social Hx: Denies tobacco use, alcohol use, or illicit drug use. Part time Job. [employer] with 3 children 6, 2, 1 and husband is a Administrator.  Objective:  BP 122/78   Pulse 76   Temp 98 F (36.7 C) (Oral)   Ht 5\' 4"  (1.626 m)   Wt 179 lb 3.2 oz (81.3 kg)   LMP 11/07/2017   SpO2 99%   BMI 30.76 kg/m  Vitals and nursing  note reviewed  General: NAD, pleasant Head: Atraumatic Neck: Supple Cardiac: RRR, normal heart sounds, no murmurs Respiratory: CTAB, normal effort Abdomen: soft, nontender, nondistended. Bowel sounds present Extremities: no edema or cyanosis. WWP. MSK: normal gait Skin: warm and dry, no rashes noted Neuro: alert and oriented, no focal deficits Psych: Neatly groomed and appropriately dressed. Maintains good eye contact and is cooperative and attentive. Speech is normal volume and rate. Denies SI/ HI. Normal affect.  Assessment & Plan:    Type 2 diabetes mellitus without complication, without long-term current use of insulin ([place])  Could possibly be gestational diabetes that patient did not understand to follow up on due to language barrier, will check A1c today. Recent CBG in ED was 83. Does not check at home. No recent A1c recorded.  Health Maintenance: Not due for pap. Will refer for ophthalmologist.   [place] Janara Klett, DO Family Medicine Resident, PGY-1

## 2017-12-02 LAB — HEMOGLOBIN A1C
ESTIMATED AVERAGE GLUCOSE: 111 mg/dL
HEMOGLOBIN A1C: 5.5 % (ref 4.8–5.6)

## 2017-12-04 ENCOUNTER — Telehealth: Payer: Self-pay | Admitting: Family Medicine

## 2017-12-04 NOTE — Telephone Encounter (Signed)
Attempted to call patient twice, but has no voicemail activated.  Her A1c of 5.5 shows that she is in very good control of her diabetes. She will not need to start any medications and she is doing well with her current diet. She may have just had gestational diabetesand did not get informed that it had resolved due to lack of follow up or communication issues. Informed her at last visit that gestational diabetes increases risk of type II diabetes.

## 2017-12-11 ENCOUNTER — Encounter: Payer: Self-pay | Admitting: Family Medicine

## 2018-01-11 ENCOUNTER — Encounter: Payer: Self-pay | Admitting: Family Medicine

## 2018-01-11 ENCOUNTER — Ambulatory Visit (INDEPENDENT_AMBULATORY_CARE_PROVIDER_SITE_OTHER): Payer: BLUE CROSS/BLUE SHIELD | Admitting: Family Medicine

## 2018-01-11 ENCOUNTER — Other Ambulatory Visit: Payer: Self-pay

## 2018-01-11 VITALS — BP 110/70 | HR 59 | Temp 98.3°F | Wt 173.0 lb

## 2018-01-11 DIAGNOSIS — E119 Type 2 diabetes mellitus without complications: Secondary | ICD-10-CM

## 2018-01-11 DIAGNOSIS — R42 Dizziness and giddiness: Secondary | ICD-10-CM

## 2018-01-11 MED ORDER — GLUCOSE BLOOD VI STRP: ORAL_STRIP | 12 refills | Status: DC

## 2018-01-11 MED ORDER — BAYER CONTOUR NEXT USB MONITOR W/DEVICE KIT: 1.0000 | PACK | Freq: Two times a day (BID) | 0 refills | Status: DC

## 2018-01-11 MED ORDER — MICROLET NEXT LANCING DEVICE MISC: 1.0000 | Freq: Two times a day (BID) | 0 refills | Status: DC

## 2018-01-11 MED ORDER — MICROLET LANCETS MISC: 1.0000 | Freq: Two times a day (BID) | 12 refills | Status: DC

## 2018-01-11 NOTE — Progress Notes (Signed)
   Subjective:    Patient ID: Jasmin Ellis is a 27 y.o. female presenting with Headache  on 01/11/2018  HPI: 1 week h/o dizziness and headache. Feels like she might pass out. Denies recent URI. Not taking any medicine or OTC products.  LMP 01/05/2018. No chance of pregnancy. Has an IUD copper x 1 year. Last HgbA1C was 5.5. Has no h/o of previous similar episode. Denies heart palpitations. Dizziness occurs with prolonged standing. Still feels it with sitting down. Lasts for several minutes. Cycles are heavy. Notes some DOE and CP with exercise. Has heavy cycles and they last 5-7 days. Has also been fasting recently to help with her BS. Has tea or coffee + bread at 5 am and does not eat again until 7:30 pm.  Review of Systems  Constitutional: Negative for chills and fever.  Respiratory: Negative for shortness of breath.   Cardiovascular: Negative for chest pain.  Gastrointestinal: Negative for abdominal pain, nausea and vomiting.  Genitourinary: Negative for dysuria.  Skin: Negative for rash.  Neurological: Positive for dizziness and light-headedness. Negative for syncope.      Objective:    BP 110/70   Pulse (!) 59   Temp 98.3 F (36.8 C) (Oral)   Wt 173 lb (78.5 kg)   LMP 01/07/2018 (Exact Date)   SpO2 99%   Breastfeeding? No   BMI 29.70 kg/m  Physical Exam  Constitutional: She is oriented to person, place, and time. She appears well-developed and well-nourished. No distress.  HENT:  Head: Normocephalic and atraumatic.  Eyes: No scleral icterus.  Neck: Neck supple.  Cardiovascular: Normal rate.  Pulmonary/Chest: Effort normal.  Abdominal: Soft.  Neurological: She is alert and oriented to person, place, and time.  Skin: Skin is warm and dry.  Psychiatric: She has a normal mood and affect.        Assessment & Plan:   Problem List Items Addressed This Visit      Unprioritized   Type 2 diabetes mellitus without complication, without long-term current use of insulin  (HCC)    Last A1C is 5.5-->given more supplies to keep check on this.      Relevant Medications   Blood Glucose Monitoring Suppl (BAYER CONTOUR NEXT USB MONITOR) w/Device KIT   glucose blood test strip   Lancet Devices (MICROLET NEXT LANCING DEVICE) MISC   MICROLET LANCETS MISC   Dizziness - Primary    Check CBC, CMP and TSH and treat accordingly. May be related to fasting, discussed with her, add protein to am meal.      Relevant Orders   CBC   TSH   Comprehensive metabolic panel      Total face-to-face time with patient: 15 minutes. Over 50% of encounter was spent on counseling and coordination of care. Return if symptoms worsen or fail to improve.  Donnamae Jude 01/11/2018 2:36 PM

## 2018-01-11 NOTE — Patient Instructions (Signed)
Dizziness °Dizziness is a common problem. It is a feeling of unsteadiness or light-headedness. You may feel like you are about to faint. Dizziness can lead to injury if you stumble or fall. Anyone can become dizzy, but dizziness is more common in older adults. This condition can be caused by a number of things, including medicines, dehydration, or illness. °Follow these instructions at home: °Eating and drinking °· Drink enough fluid to keep your urine clear or pale yellow. This helps to keep you from becoming dehydrated. Try to drink more clear fluids, such as water. °· Do not drink alcohol. °· Limit your caffeine intake if told to do so by your health care provider. Check ingredients and nutrition facts to see if a food or beverage contains caffeine. °· Limit your salt (sodium) intake if told to do so by your health care provider. Check ingredients and nutrition facts to see if a food or beverage contains sodium. °Activity °· Avoid making quick movements. °? Rise slowly from chairs and steady yourself until you feel okay. °? In the morning, first sit up on the side of the bed. When you feel okay, stand slowly while you hold onto something until you know that your balance is fine. °· If you need to stand in one place for a long time, move your legs often. Tighten and relax the muscles in your legs while you are standing. °· Do not drive or use heavy machinery if you feel dizzy. °· Avoid bending down if you feel dizzy. Place items in your home so that they are easy for you to reach without leaning over. °Lifestyle °· Do not use any products that contain nicotine or tobacco, such as cigarettes and e-cigarettes. If you need help quitting, ask your health care provider. °· Try to reduce your stress level by using methods such as yoga or meditation. Talk with your health care provider if you need help to manage your stress. °General instructions °· Watch your dizziness for any changes. °· Take over-the-counter and  prescription medicines only as told by your health care provider. Talk with your health care provider if you think that your dizziness is caused by a medicine that you are taking. °· Tell a friend or a family member that you are feeling dizzy. If he or she notices any changes in your behavior, have this person call your health care provider. °· Keep all follow-up visits as told by your health care provider. This is important. °Contact a health care provider if: °· Your dizziness does not go away. °· Your dizziness or light-headedness gets worse. °· You feel nauseous. °· You have reduced hearing. °· You have new symptoms. °· You are unsteady on your feet or you feel like the room is spinning. °Get help right away if: °· You vomit or have diarrhea and are unable to eat or drink anything. °· You have problems talking, walking, swallowing, or using your arms, hands, or legs. °· You feel generally weak. °· You are not thinking clearly or you have trouble forming sentences. It may take a friend or family member to notice this. °· You have chest pain, abdominal pain, shortness of breath, or sweating. °· Your vision changes. °· You have any bleeding. °· You have a severe headache. °· You have neck pain or a stiff neck. °· You have a fever. °These symptoms may represent a serious problem that is an emergency. Do not wait to see if the symptoms will go away. Get medical help   right away. Call your local emergency services (911 in the [place] Do not drive yourself to the hospital. °Summary °· Dizziness is a feeling of unsteadiness or light-headedness. This condition can be caused by a number of things, including medicines, dehydration, or illness. °· Anyone can become dizzy, but dizziness is more common in older adults. °· Drink enough fluid to keep your urine clear or pale yellow. Do not drink alcohol. °· Avoid making quick movements if you feel dizzy. Monitor your dizziness for any changes. °This information is not intended to  replace advice given to you by your health care provider. Make sure you discuss any questions you have with your health care provider. °Document Released: 03/29/2001 Document Revised: 11/05/2016 Document Reviewed: 11/05/2016 °Elsevier Interactive Patient Education © 2018 [employer] ° °

## 2018-01-11 NOTE — Assessment & Plan Note (Signed)
Last A1C is 5.5-->given more supplies to keep check on this.

## 2018-01-11 NOTE — Assessment & Plan Note (Addendum)
Check CBC, CMP and TSH and treat accordingly. May be related to fasting, discussed with her, add protein to am meal.

## 2018-01-12 ENCOUNTER — Telehealth: Payer: Self-pay | Admitting: *Deleted

## 2018-01-12 DIAGNOSIS — E119 Type 2 diabetes mellitus without complications: Secondary | ICD-10-CM

## 2018-01-12 LAB — COMPREHENSIVE METABOLIC PANEL
A/G RATIO: 1.5 (ref 1.2–2.2)
ALBUMIN: 4.2 g/dL (ref 3.5–5.5)
ALK PHOS: 50 IU/L (ref 39–117)
ALT: 11 IU/L (ref 0–32)
AST: 13 IU/L (ref 0–40)
BUN / CREAT RATIO: 16 (ref 9–23)
BUN: 10 mg/dL (ref 6–20)
CHLORIDE: 102 mmol/L (ref 96–106)
CO2: 24 mmol/L (ref 20–29)
Calcium: 9.8 mg/dL (ref 8.7–10.2)
Creatinine, Ser: 0.62 mg/dL (ref 0.57–1.00)
GFR calc non Af Amer: 122 mL/min/{1.73_m2} (ref >59)
GFR, EST AFRICAN AMERICAN: 141 mL/min/{1.73_m2} (ref >59)
Globulin, Total: 2.8 g/dL (ref 1.5–4.5)
Glucose: 81 mg/dL (ref 65–99)
POTASSIUM: 4.5 mmol/L (ref 3.5–5.2)
Sodium: 138 mmol/L (ref 134–144)
TOTAL PROTEIN: 7 g/dL (ref 6.0–8.5)

## 2018-01-12 LAB — CBC
HEMOGLOBIN: 11 g/dL — AB (ref 11.1–15.9)
Hematocrit: 34 % (ref 34.0–46.6)
MCH: 27.9 pg (ref 26.6–33.0)
MCHC: 32.4 g/dL (ref 31.5–35.7)
MCV: 86 fL (ref 79–97)
Platelets: 349 10*3/uL (ref 150–379)
RBC: 3.94 x10E6/uL (ref 3.77–5.28)
RDW: 14.6 % (ref 12.3–15.4)
WBC: 6.3 10*3/uL (ref 3.4–10.8)

## 2018-01-12 LAB — TSH: TSH: 0.544 u[IU]/mL (ref 0.450–4.500)

## 2018-01-12 MED ORDER — GLUCOSE BLOOD VI STRP: ORAL_STRIP | 12 refills | Status: DC

## 2018-01-12 MED ORDER — ONETOUCH ULTRASOFT LANCETS MISC: 12 refills | Status: DC

## 2018-01-12 MED ORDER — LANCET DEVICE MISC: 12 refills | Status: DC

## 2018-01-12 MED ORDER — ONETOUCH ULTRA 2 W/DEVICE KIT: PACK | 0 refills | Status: DC

## 2018-01-12 NOTE — Telephone Encounter (Signed)
Tried to call patient regarding glucometer but there was no answer or voicemail.  If she calls back let her know that we needed to change the brand of machine and that she will need to take her insurance card with her to the pharmacy when she picks it up.  Shayanna Thatch,CMA

## 2018-01-15 ENCOUNTER — Emergency Department (HOSPITAL_COMMUNITY)
Admission: EM | Admit: 2018-01-15 | Discharge: 2018-01-15 | Disposition: A | Discharge status: Home / Self Care | Payer: BLUE CROSS/BLUE SHIELD | Attending: Physician Assistant | Admitting: Physician Assistant

## 2018-01-15 ENCOUNTER — Emergency Department (HOSPITAL_COMMUNITY): Payer: BLUE CROSS/BLUE SHIELD

## 2018-01-15 ENCOUNTER — Other Ambulatory Visit: Payer: Self-pay

## 2018-01-15 ENCOUNTER — Encounter (HOSPITAL_COMMUNITY): Payer: Self-pay

## 2018-01-15 DIAGNOSIS — R0781 Pleurodynia: Secondary | ICD-10-CM | POA: Diagnosis not present

## 2018-01-15 DIAGNOSIS — R42 Dizziness and giddiness: Secondary | ICD-10-CM | POA: Diagnosis not present

## 2018-01-15 DIAGNOSIS — E119 Type 2 diabetes mellitus without complications: Secondary | ICD-10-CM | POA: Insufficient documentation

## 2018-01-15 LAB — I-STAT TROPONIN, ED: Troponin i, poc: 0 ng/mL (ref 0.00–0.08)

## 2018-01-15 LAB — CBC
HEMATOCRIT: 34.1 % — AB (ref 36.0–46.0)
Hemoglobin: 11.2 g/dL — ABNORMAL LOW (ref 12.0–15.0)
MCH: 28 pg (ref 26.0–34.0)
MCHC: 32.8 g/dL (ref 30.0–36.0)
MCV: 85.3 fL (ref 78.0–100.0)
Platelets: 316 10*3/uL (ref 150–400)
RBC: 4 MIL/uL (ref 3.87–5.11)
RDW: 13.1 % (ref 11.5–15.5)
WBC: 6.5 10*3/uL (ref 4.0–10.5)

## 2018-01-15 LAB — BASIC METABOLIC PANEL
ANION GAP: 7 (ref 5–15)
BUN: 10 mg/dL (ref 6–20)
CHLORIDE: 106 mmol/L (ref 101–111)
CO2: 24 mmol/L (ref 22–32)
Calcium: 9.6 mg/dL (ref 8.9–10.3)
Creatinine, Ser: 0.63 mg/dL (ref 0.44–1.00)
Glucose, Bld: 88 mg/dL (ref 65–99)
POTASSIUM: 3.8 mmol/L (ref 3.5–5.1)
SODIUM: 137 mmol/L (ref 135–145)

## 2018-01-15 LAB — I-STAT BETA HCG BLOOD, ED (MC, WL, AP ONLY)

## 2018-01-15 LAB — D-DIMER, QUANTITATIVE: D-Dimer, Quant: 0.55 ug/mL-FEU — ABNORMAL HIGH (ref 0.00–0.50)

## 2018-01-15 MED ORDER — IOPAMIDOL (ISOVUE-370) INJECTION 76%
INTRAVENOUS | Status: AC
  Administered 2018-01-15: 100 mL via INTRAVENOUS
  Filled 2018-01-15: qty 100

## 2018-01-15 MED ORDER — SODIUM CHLORIDE 0.9 % IV BOLUS
1000.0000 mL | Freq: Once | INTRAVENOUS | Status: AC
  Administered 2018-01-15: 1000 mL via INTRAVENOUS

## 2018-01-15 NOTE — ED Notes (Signed)
Spoke to lab who has a blue top and will add on d-dimer.

## 2018-01-15 NOTE — ED Triage Notes (Signed)
Pt reports intermittent chest pain X1 week. Pt also reports dizziness. She reports seeing her PCP but states she does not understand the results. Skin warm and dry, no distress noted.

## 2018-01-15 NOTE — ED Provider Notes (Signed)
[facility] EMERGENCY DEPARTMENT Provider Note   CSN: [id number] Arrival date & time: 01/15/18  1347     History   Chief Complaint Chief Complaint  Patient presents with  . Chest Pain    HPI Jasmin Ellis is a 27 y.o. female.  HPI   Patient is a 27 year old female presenting with pleuritic chest and dizziness.  Patient reports that when she goes from sitting to standing she gets dizzy.  She reports that happens for the last week.  She reports is also associated with a sharp pleuritic chest pain upon deep breaths.  Patient saw her primary care physician, with no diagnosis discovered.  Patient continued to have symptoms so came here for second opinion.  Patient is on oral estrogens.   Past Medical History:  Diagnosis Date  . Anemia   . Back pain, chronic    s/p fall 8 years ago  . Diabetes mellitus without complication ([place])    type 2  . Fibroids     Patient Active Problem List   Diagnosis Date Noted  . Dizziness 01/11/2018  . Type 2 diabetes mellitus without complication, without long-term current use of insulin ([place]) 12/01/2017  . Back pain, chronic     Past Surgical History:  Procedure Laterality Date  . NO PAST SURGERIES       OB History    Gravida  3   Para  3   Term  3   Preterm  0   AB  0   Living  3     SAB  0   TAB  0   Ectopic  0   Multiple      Live Births  3            Home Medications    Prior to Admission medications   Medication Sig Start Date End Date Taking? Authorizing Provider  Blood Glucose Monitoring Suppl (ONE TOUCH ULTRA 2) w/Device KIT Use to test blood glucose 01/12/18   Donnamae Jude, MD  glucose blood test strip Test BID 01/12/18   Donnamae Jude, MD  ibuprofen (ADVIL,MOTRIN) 600 MG tablet Take 1 tablet (600 mg total) by mouth every 6 (six) hours as needed. 06/26/16   Donnel Saxon, CNM  Lancet Device MISC BID 01/12/18   Donnamae Jude, MD  Lancets [facility] ULTRASOFT) lancets Use as  instructed 01/12/18   Donnamae Jude, MD    Family History Family History  Problem Relation Age of Onset  . Diabetes Father     Social History Social History   Tobacco Use  . Smoking status: Never Smoker  . Smokeless tobacco: Never Used  Substance Use Topics  . Alcohol use: No  . Drug use: No     Allergies   Patient has no known allergies.   Review of Systems Review of Systems  Constitutional: Negative for activity change.  Respiratory: Positive for shortness of breath.   Cardiovascular: Negative for chest pain.  Gastrointestinal: Negative for abdominal pain.  Neurological: Positive for dizziness.     Physical Exam Updated Vital Signs BP (!) 143/88 (BP Location: Right Arm)   Pulse 69   Temp 98.1 F (36.7 C) (Oral)   Resp 18   LMP 01/07/2018 (Exact Date)   SpO2 100%   Physical Exam  Constitutional: She is oriented to person, place, and time. She appears well-developed and well-nourished.  HENT:  Head: Normocephalic and atraumatic.  Eyes: Right eye exhibits no discharge.  Cardiovascular: Normal rate,  regular rhythm, intact distal pulses and normal pulses.  Pulmonary/Chest: Effort normal and breath sounds normal. No accessory muscle usage. No respiratory distress.  Neurological: She is oriented to person, place, and time.  Skin: Skin is warm and dry. She is not diaphoretic.  Psychiatric: She has a normal mood and affect.  Nursing note and vitals reviewed.    ED Treatments / Results  Labs (all labs ordered are listed, but only abnormal results are displayed) Labs Reviewed  CBC - Abnormal; Notable for the following components:      Result Value   Hemoglobin 11.2 (*)    HCT 34.1 (*)    All other components within normal limits  BASIC METABOLIC PANEL  D-DIMER, QUANTITATIVE (NOT AT [facility])  I-STAT TROPONIN, ED  I-STAT BETA HCG BLOOD, ED ([facility], [facility], [facility] ONLY)    EKG EKG Interpretation  Date/Time:  Monday January 15 2018 13:53:39 EDT Ventricular Rate:   71 PR Interval:  170 QRS Duration: 82 QT Interval:  380 QTC Calculation: 412 R Axis:   68 Text Interpretation:  Normal sinus rhythm with sinus arrhythmia Nonspecific T wave abnormality Abnormal ECG Normal sinus rhythm Confirmed by Thomasene Lot, [place] [phone]) on 01/15/2018 5:56:45 PM   Radiology Dg Chest 2 View  Result Date: 01/15/2018 CLINICAL DATA:  Intermittent chest pain for 1 week EXAM: CHEST - 2 VIEW COMPARISON:  10/06/15 FINDINGS: The heart size and mediastinal contours are within normal limits. Both lungs are clear. The visualized skeletal structures are unremarkable. IMPRESSION: No active cardiopulmonary disease. Electronically Signed   By: Inez Catalina M.D.   On: 01/15/2018 16:47    Procedures Procedures (including critical care time)  Medications Ordered in ED Medications - No data to display   Initial Impression / Assessment and Plan / ED Course  I have reviewed the triage vital signs and the nursing notes.  Pertinent labs & imaging results that were available during my care of the patient were reviewed by me and considered in my medical decision making (see chart for details).     Patient is a 27 year old female presenting with pleuritic chest and dizziness.  Patient reports that when she goes from sitting to standing she gets dizzy.  She reports that happens for the last week.  She reports is also associated with a sharp pleuritic chest pain upon deep breaths.  Patient saw her primary care physician, with no diagnosis discovered.  Patient continued to have symptoms so came here for second opinion.  Patient is on oral estrogens.   6:19 PM Patient does not PERC out.  Will send d-dimer.  Dizziness sounds very much like orthostatic hypotension.  Patient reports is only from sitting to standing.  Patient denies any recent history of decreased p.o. intake.  Will get orthostatic vital signs, give fluids.  D dimer postiive, CT negative. Pt feels improved. Will dc.   Final  Clinical Impressions(s) / ED Diagnoses   Final diagnoses:  None    ED Discharge Orders    None       Macarthur Critchley, MD 01/18/18 [phone]

## 2018-01-15 NOTE — Discharge Instructions (Addendum)
Very happy to report that your EKG, x-ray, labs, CT, vital signs are all reassuring today.  We think that the dizziness that you are getting upon standing is likely due to dehydration or orthostatic hypotension.  Please be sure you are drinking plenty of fluids throughout the day.  And go slowly from sitting to standing.  Please return to follow-up with your primary care physician.

## 2018-01-15 NOTE — ED Notes (Signed)
Patient left at this time with all belongings. 

## 2018-02-01 ENCOUNTER — Encounter: Payer: Self-pay | Admitting: Family Medicine

## 2018-02-04 NOTE — Progress Notes (Signed)
   [facility] Phone: [phone]   Date of Visit: 02/05/2018   HPI:  Hordeolum:  - reports of bump on left upper eye lid for about 1 week. The bump seems to be improving now. It is painful - no drainage, no other swelling of the eye lid, no red eyes or drainage. No vision changes.  - reports of history of these; would get one at least once a year. It is not in the same location   Dizziness: - patient was initially seen for dizziness on 3/28. She had CBC, TSH, and CMP completed which were all unremarkable. She reports she continues to have dizziness. It is not like the room is spinning. This occurs with she stands from a seated position. No falls or syncope. No chest pain or palpitations.   ROS: See HPI.  [place]:  PMH: DM2  PHYSICAL EXAM: BP 110/64   Pulse 64   Ht 5\' 4"  (1.626 m)   Wt 177 lb (80.3 kg)   LMP 01/07/2018 (Exact Date)   SpO2 98%   BMI 30.38 kg/m  GEN: NAD HEENT: Atraumatic, normocephalic, neck supple, EOMI, sclera clear, PERRL, left upper eye lid with hordeolum (focal swelling under the conjunctival side of the eye lid) without erythema. No swelling of lower eye lid.  CV: RRR, no murmurs, rubs, or gallops PULM: CTAB, normal effort PSYCH: Mood and affect euthymic, normal rate and volume of speech NEURO: Awake, alert, no focal deficits grossly, normal speech   ASSESSMENT/PLAN: 1. Hordeolum internum left upper eyelid No signs or symptoms of preseptal or orbital cellulitis. Patient reports symptoms are improving. Warm compresses QID. Return precautions discussed.   2. Dizziness Seems consistent with orthostasis. Her work up done last month was unremarkable. EKG done in 01/2018 in the ED was unremarkable. Unlikely cardiac in etiology. Recommended staying hydrated with water through out the day.  Smiley Houseman, MD PGY [place]

## 2018-02-05 ENCOUNTER — Other Ambulatory Visit: Payer: Self-pay

## 2018-02-05 ENCOUNTER — Ambulatory Visit (INDEPENDENT_AMBULATORY_CARE_PROVIDER_SITE_OTHER): Payer: BLUE CROSS/BLUE SHIELD | Admitting: Internal Medicine

## 2018-02-05 ENCOUNTER — Encounter: Payer: Self-pay | Admitting: Internal Medicine

## 2018-02-05 VITALS — BP 110/64 | HR 64 | Ht 64.0 in | Wt 177.0 lb

## 2018-02-05 DIAGNOSIS — R42 Dizziness and giddiness: Secondary | ICD-10-CM | POA: Diagnosis not present

## 2018-02-05 DIAGNOSIS — H00024 Hordeolum internum left upper eyelid: Secondary | ICD-10-CM

## 2018-02-05 NOTE — Patient Instructions (Signed)
Please do warm compresses for 15 minutes at least four times a day to help the duct open and drain.   Please return to clinic if your eye lid swells or becomes red or if you have fevers or chills   Please try to drink water regularly through out the day

## 2018-02-12 ENCOUNTER — Emergency Department (HOSPITAL_COMMUNITY): Payer: BLUE CROSS/BLUE SHIELD

## 2018-02-12 ENCOUNTER — Encounter: Payer: Self-pay | Admitting: Family Medicine

## 2018-02-12 ENCOUNTER — Emergency Department (HOSPITAL_COMMUNITY)
Admission: EM | Admit: 2018-02-12 | Discharge: 2018-02-12 | Disposition: A | Discharge status: Home / Self Care | Payer: BLUE CROSS/BLUE SHIELD | Attending: Emergency Medicine | Admitting: Emergency Medicine

## 2018-02-12 ENCOUNTER — Encounter (HOSPITAL_COMMUNITY): Payer: Self-pay

## 2018-02-12 ENCOUNTER — Other Ambulatory Visit: Payer: Self-pay

## 2018-02-12 DIAGNOSIS — E119 Type 2 diabetes mellitus without complications: Secondary | ICD-10-CM | POA: Diagnosis not present

## 2018-02-12 DIAGNOSIS — R51 Headache: Secondary | ICD-10-CM | POA: Diagnosis not present

## 2018-02-12 DIAGNOSIS — R519 Headache, unspecified: Secondary | ICD-10-CM

## 2018-02-12 MED ORDER — KETOROLAC TROMETHAMINE 30 MG/ML IJ SOLN
30.0000 mg | Freq: Once | INTRAMUSCULAR | Status: AC
  Administered 2018-02-12: 30 mg via INTRAMUSCULAR
  Filled 2018-02-12: qty 1

## 2018-02-12 MED ORDER — PROMETHAZINE HCL 25 MG PO TABS
25.0000 mg | ORAL_TABLET | Freq: Once | ORAL | Status: AC
  Administered 2018-02-12: 25 mg via ORAL
  Filled 2018-02-12: qty 1

## 2018-02-12 NOTE — ED Triage Notes (Signed)
Pt endorses headache x 3 weeks. Seen here then and had blood work and ct scan and told it was negative. Pt has been having sx on and off for 3 months. No neuro deficits.

## 2018-02-12 NOTE — Discharge Instructions (Addendum)
Please read attached information. If you experience any new or worsening signs or symptoms please return to the emergency room for evaluation. Please follow-up with your primary care provider or specialist as discussed.  °

## 2018-02-12 NOTE — ED Provider Notes (Signed)
[facility] EMERGENCY DEPARTMENT Provider Note   CSN: [id number] Arrival date & time: 02/12/18  1330     History   Chief Complaint Chief Complaint  Patient presents with  . Headache    HPI Jasmin Ellis is a 27 y.o. female.  HPI   27 year old female presents today with complaints of headache.  Patient notes she woke up this morning and developed a frontal headache.  She denies any associated neurological deficits, fever, neck stiffness, trauma, or any other red flags.  Patient notes that over the last 3 weeks she has had intermittent episodes of dizziness, has been seen several times with no acute abnormalities noted on work-ups including cardiac or CT angios.  She notes this feels similar to previous with room spinning sensation worse with standing.  She reports she took multivitamin today, she did not take any pain medication.  Past Medical History:  Diagnosis Date  . Anemia   . Back pain, chronic    s/p fall 8 years ago  . Diabetes mellitus without complication ([place])    type 2  . Fibroids     Patient Active Problem List   Diagnosis Date Noted  . Dizziness 01/11/2018  . Type 2 diabetes mellitus without complication, without long-term current use of insulin ([place]) 12/01/2017  . Back pain, chronic     Past Surgical History:  Procedure Laterality Date  . NO PAST SURGERIES       OB History    Gravida  3   Para  3   Term  3   Preterm  0   AB  0   Living  3     SAB  0   TAB  0   Ectopic  0   Multiple      Live Births  3            Home Medications    Prior to Admission medications   Medication Sig Start Date End Date Taking? Authorizing Provider  Blood Glucose Monitoring Suppl (ONE TOUCH ULTRA 2) w/Device KIT Use to test blood glucose 01/12/18   Donnamae Jude, MD  glucose blood test strip Test BID 01/12/18   Donnamae Jude, MD  ibuprofen (ADVIL,MOTRIN) 600 MG tablet Take 1 tablet (600 mg total) by mouth every 6 (six)  hours as needed. 06/26/16   Donnel Saxon, CNM  Lancet Device MISC BID 01/12/18   Donnamae Jude, MD  Lancets [facility] ULTRASOFT) lancets Use as instructed 01/12/18   Donnamae Jude, MD    Family History Family History  Problem Relation Age of Onset  . Diabetes Father     Social History Social History   Tobacco Use  . Smoking status: Never Smoker  . Smokeless tobacco: Never Used  Substance Use Topics  . Alcohol use: No  . Drug use: No     Allergies   Patient has no known allergies.   Review of Systems Review of Systems  All other systems reviewed and are negative.    Physical Exam Updated Vital Signs BP 134/90   Pulse 82   Temp 98 F (36.7 C) (Oral)   Resp 18   Ht 5' 4" (1.626 m)   Wt 80.3 kg (177 lb)   LMP 02/03/2018 (Approximate)   SpO2 99%   Breastfeeding? No   BMI 30.38 kg/m   Physical Exam  Constitutional: She is oriented to person, place, and time. She appears well-developed and well-nourished.  HENT:  Head: Normocephalic and  atraumatic.  Eyes: Pupils are equal, round, and reactive to light. Conjunctivae are normal. Right eye exhibits no discharge. Left eye exhibits no discharge. No scleral icterus.  Neck: Normal range of motion. No JVD present. No tracheal deviation present.  Cardiovascular: Normal rate, regular rhythm and normal heart sounds.  Pulmonary/Chest: Effort normal and breath sounds normal. No stridor. No respiratory distress. She has no wheezes. She has no rales.  Neurological: She is alert and oriented to person, place, and time. No cranial nerve deficit or sensory deficit. She exhibits normal muscle tone. Coordination normal. GCS eye subscore is 4. GCS verbal subscore is 5. GCS motor subscore is 6.  Psychiatric: She has a normal mood and affect. Her behavior is normal. Judgment and thought content normal.  Nursing note and vitals reviewed.    ED Treatments / Results  Labs (all labs ordered are listed, but only abnormal results are  displayed) Labs Reviewed - No data to display  EKG None  Radiology Ct Head Wo Contrast  Result Date: 02/12/2018 CLINICAL DATA:  Headache for 2 weeks, progressing EXAM: CT HEAD WITHOUT CONTRAST TECHNIQUE: Contiguous axial images were obtained from the base of the skull through the vertex without intravenous contrast. COMPARISON:  None. FINDINGS: Brain: The ventricles are normal in size and configuration. There is no intracranial mass, hemorrhage, extra-axial fluid collection, or midline shift. The gray-white compartments are normal. No evident acute infarct. Vascular: There is no hyperdense vessel evident. There is no appreciable vascular calcification. Skull: The bony calvarium appears intact. Sinuses/Orbits: Visualized paranasal sinuses are clear. Orbits appear symmetric bilaterally. Other: Mastoid air cells are clear. IMPRESSION: Study within normal limits. Electronically Signed   By: Lowella Grip III M.D.   On: 02/12/2018 16:17    Procedures Procedures (including critical care time)  Medications Ordered in ED Medications  ketorolac (TORADOL) 30 MG/ML injection 30 mg (30 mg Intramuscular Given 02/12/18 1756)  promethazine (PHENERGAN) tablet 25 mg (25 mg Oral Given 02/12/18 1756)     Initial Impression / Assessment and Plan / ED Course  I have reviewed the triage vital signs and the nursing notes.  Pertinent labs & imaging results that were available during my care of the patient were reviewed by me and considered in my medical decision making (see chart for details).      Final Clinical Impressions(s) / ED Diagnoses   Final diagnoses:  Acute nonintractable headache, unspecified headache type    Labs:   Imaging: Ct head WO  Consults:  Therapeutics:  Discharge Meds:   Assessment/Plan: 27 year old female presents today with complaints of headache.  Patient has no red flags here today, head CT without acute abnormality.  Patient be treated with Toradol, promethazine,  she will be referred to neurology for ongoing evaluation management.  Patient has had significant work-up recently with CT angio chest PE with no acute abnormalities.  she is given strict return precautions, she verbalized understanding and agreement to today's plan had no further actions concerns at the time of discharge.   ED Discharge Orders    None       Francee Gentile 02/12/18 2045    Tanna Furry, MD 02/17/18 [phone]

## 2018-02-13 ENCOUNTER — Other Ambulatory Visit: Payer: Self-pay

## 2018-02-13 ENCOUNTER — Ambulatory Visit (INDEPENDENT_AMBULATORY_CARE_PROVIDER_SITE_OTHER): Payer: BLUE CROSS/BLUE SHIELD | Admitting: Family Medicine

## 2018-02-13 ENCOUNTER — Encounter: Payer: Self-pay | Admitting: Family Medicine

## 2018-02-13 DIAGNOSIS — R5383 Other fatigue: Secondary | ICD-10-CM

## 2018-02-13 NOTE — Assessment & Plan Note (Signed)
No clear cause.  Does not meet criteria for depression.  Has had large work up in [place].  Will repeat cbc and cmet to see if has changed but I doubt will be revealing.  She very much feels something is wrong inside.   Does have  6 lb weight loss but has been trying in order to control her diabetes.   Perhaps a prolonged post viral fatigue like EBV.   No signs of cancer or inflammatory disease.  Vague neurological complaints of dizziness.  Will see how she is on followup with PCP

## 2018-02-13 NOTE — Progress Notes (Signed)
Subjective  Jasmin Ellis is a 27 y.o. female is presenting with the following  FATIGUE - Not feeling "well" For last 3 months has not felt well like something is wrong.  Has been seen in ER x 2 (4/1 and yesterday) had blood work and CT chest and head.  Sometimes has dizziness which is hard to describe.  Often feels tired when she works or moves around.  Loses and gains weight.  Does not feel like doing anything fun and does not have much of an appetite.  Does not feel down and no suicidal ideation.   Sometimes had tenderness in her breasts  Which comes and goes   PMH Copper IUD Ibuprofen as needed Diet controlled diabetes   Works lifting pallets 4 days a week.  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  3 children and husband at home   Chief Complaint noted Review of Symptoms - see HPI PMH - Smoking status noted.    Objective Vital Signs reviewed BP 128/78   Pulse 88   Temp 98.4 F (36.9 C) (Oral)   Wt 171 lb (77.6 kg)   LMP 02/03/2018 (Approximate)   SpO2 97%   BMI 29.35 kg/m   PHQ 9 = 4 Psych:  Cognition and judgment appear intact. Alert, communicative  and cooperative with normal attention span and concentration. No apparent delusions, illusions, hallucinations Neck:  No deformities, thyromegaly, masses, or tenderness noted.   Supple with full range of motion without pain. Heart - Regular rate and rhythm.  No murmurs, gallops or rubs.    Lungs:  Normal respiratory effort, chest expands symmetrically. Lungs are clear to auscultation, no crackles or wheezes. Extremities:  No cyanosis, edema, or deformity noted with good range of motion of all major joints.      Assessments/Plans  See after visit summary for details of patient instuctions  Fatigue No clear cause.  Does not meet criteria for depression.  Has had large work up in [place].  Will repeat cbc and cmet to see if has changed but I doubt will be revealing.  She very much feels something is  wrong inside.   Does have  6 lb weight loss but has been trying in order to control her diabetes.   Perhaps a prolonged post viral fatigue like EBV.   No signs of cancer or inflammatory disease.  Vague neurological complaints of dizziness.  Will see how she is on followup with PCP

## 2018-02-13 NOTE — Patient Instructions (Signed)
Good to see you today!  Thanks for coming in.  We well recheck your blood tests to see if any changes.  I think you either have depression or a post virus fatigue sort of like Mononucleosis where you can be tired for months.  I will call you if your lab tests are not normal.  Otherwise we will discuss them at your next visit.  Make an appointment with Dr Enid Derry for a follow up.  Take your vitamin every day   Do something a little bit fun every day

## 2018-02-14 LAB — CMP14+EGFR
A/G RATIO: 1.5 (ref 1.2–2.2)
ALT: 7 IU/L (ref 0–32)
AST: 12 IU/L (ref 0–40)
Albumin: 4.4 g/dL (ref 3.5–5.5)
Alkaline Phosphatase: 50 IU/L (ref 39–117)
BUN/Creatinine Ratio: 11 (ref 9–23)
BUN: 8 mg/dL (ref 6–20)
Bilirubin Total: 0.4 mg/dL (ref 0.0–1.2)
CALCIUM: 9.9 mg/dL (ref 8.7–10.2)
CO2: 20 mmol/L (ref 20–29)
CREATININE: 0.71 mg/dL (ref 0.57–1.00)
Chloride: 103 mmol/L (ref 96–106)
GFR, EST AFRICAN AMERICAN: 133 mL/min/{1.73_m2} (ref >59)
GFR, EST NON AFRICAN AMERICAN: 115 mL/min/{1.73_m2} (ref >59)
Globulin, Total: 2.9 g/dL (ref 1.5–4.5)
Glucose: 152 mg/dL — ABNORMAL HIGH (ref 65–99)
POTASSIUM: 4 mmol/L (ref 3.5–5.2)
Sodium: 138 mmol/L (ref 134–144)
TOTAL PROTEIN: 7.3 g/dL (ref 6.0–8.5)

## 2018-02-14 LAB — CBC WITH DIFFERENTIAL/PLATELET
BASOS: 1 %
Basophils Absolute: 0.1 10*3/uL (ref 0.0–0.2)
EOS (ABSOLUTE): 0.2 10*3/uL (ref 0.0–0.4)
EOS: 5 %
HEMATOCRIT: 37 % (ref 34.0–46.6)
Hemoglobin: 12.4 g/dL (ref 11.1–15.9)
IMMATURE GRANS (ABS): 0 10*3/uL (ref 0.0–0.1)
IMMATURE GRANULOCYTES: 0 %
LYMPHS: 31 %
Lymphocytes Absolute: 1.6 10*3/uL (ref 0.7–3.1)
MCH: 28.6 pg (ref 26.6–33.0)
MCHC: 33.5 g/dL (ref 31.5–35.7)
MCV: 86 fL (ref 79–97)
Monocytes Absolute: 0.2 10*3/uL (ref 0.1–0.9)
Monocytes: 4 %
Neutrophils Absolute: 3.2 10*3/uL (ref 1.4–7.0)
Neutrophils: 59 %
PLATELETS: 333 10*3/uL (ref 150–379)
RBC: 4.33 x10E6/uL (ref 3.77–5.28)
RDW: 14.2 % (ref 12.3–15.4)
WBC: 5.3 10*3/uL (ref 3.4–10.8)

## 2018-02-14 LAB — TSH: TSH: 0.646 u[IU]/mL (ref 0.450–4.500)

## 2018-02-15 ENCOUNTER — Encounter: Payer: Self-pay | Admitting: Family Medicine

## 2018-02-19 ENCOUNTER — Encounter: Payer: Self-pay | Admitting: Family Medicine

## 2018-02-20 ENCOUNTER — Encounter: Payer: Self-pay | Admitting: Family Medicine

## 2018-02-25 DIAGNOSIS — R450 Nervousness: Secondary | ICD-10-CM | POA: Diagnosis not present

## 2018-02-25 DIAGNOSIS — Z79899 Other long term (current) drug therapy: Secondary | ICD-10-CM | POA: Diagnosis not present

## 2018-02-25 DIAGNOSIS — E119 Type 2 diabetes mellitus without complications: Secondary | ICD-10-CM | POA: Insufficient documentation

## 2018-02-26 ENCOUNTER — Emergency Department (HOSPITAL_COMMUNITY)
Admission: EM | Admit: 2018-02-26 | Discharge: 2018-02-26 | Disposition: A | Discharge status: Home / Self Care | Payer: BLUE CROSS/BLUE SHIELD | Attending: Emergency Medicine | Admitting: Emergency Medicine

## 2018-02-26 ENCOUNTER — Encounter (HOSPITAL_COMMUNITY): Payer: Self-pay | Admitting: Emergency Medicine

## 2018-02-26 DIAGNOSIS — R450 Nervousness: Secondary | ICD-10-CM

## 2018-02-26 LAB — BASIC METABOLIC PANEL
Anion gap: 7 (ref 5–15)
BUN: 7 mg/dL (ref 6–20)
CHLORIDE: 107 mmol/L (ref 101–111)
CO2: 22 mmol/L (ref 22–32)
CREATININE: 0.72 mg/dL (ref 0.44–1.00)
Calcium: 9.2 mg/dL (ref 8.9–10.3)
GFR calc Af Amer: >60 mL/min (ref >60)
GFR calc non Af Amer: >60 mL/min (ref >60)
GLUCOSE: 114 mg/dL — AB (ref 65–99)
POTASSIUM: 3.6 mmol/L (ref 3.5–5.1)
SODIUM: 136 mmol/L (ref 135–145)

## 2018-02-26 LAB — CBC
HEMATOCRIT: 33.2 % — AB (ref 36.0–46.0)
HEMOGLOBIN: 11.1 g/dL — AB (ref 12.0–15.0)
MCH: 28.6 pg (ref 26.0–34.0)
MCHC: 33.4 g/dL (ref 30.0–36.0)
MCV: 85.6 fL (ref 78.0–100.0)
Platelets: 282 10*3/uL (ref 150–400)
RBC: 3.88 MIL/uL (ref 3.87–5.11)
RDW: 13.5 % (ref 11.5–15.5)
WBC: 6.2 10*3/uL (ref 4.0–10.5)

## 2018-02-26 MED ORDER — LORAZEPAM 1 MG PO TABS: 1.0000 mg | ORAL_TABLET | Freq: Two times a day (BID) | ORAL | 0 refills | Status: DC | PRN

## 2018-02-26 NOTE — ED Notes (Signed)
Pt requesting prescription for anxiety medication. MD informed.

## 2018-02-26 NOTE — ED Triage Notes (Signed)
Patient reports feeling "bad in her body" for a month and a half now and started feeling anxious and shaky today.  Also reports that she hasn't been able to sleep well either.

## 2018-02-26 NOTE — Discharge Instructions (Addendum)
All the results in the ER are normal. We are not sure what is causing your symptoms. We suspect an element of anxiety - but as we discussed, it is best for primary care doctor to make a diagnosis like anxiety. Please see your primary care doctor for further evaluation.  Please return to the ER if your symptoms worsen.

## 2018-02-27 NOTE — ED Provider Notes (Signed)
[facility] EMERGENCY DEPARTMENT Provider Note   CSN: [id number] Arrival date & time: 02/25/18  2345     History   Chief Complaint Chief Complaint  Patient presents with  . Anxiety    HPI Jasmin Ellis is a 27 y.o. female.  HPI  27 year old female comes in with chief complaint of anxiety and nervousness. Patient has history of diabetes.  She states that over the past month she has been feeling shaky and nervous.  Patient also reports that because of her symptoms she has not been able to sleep well. Patient denies any severe chest pains, wheezing, cough.  She however does endorse intermittent episodes of chest pain and shortness of breath. Pt has no hx of PE, DVT and denies any exogenous hormone (testosterone / estrogen) use, long distance travels or surgery in the past 6 weeks, active cancer, recent immobilization.   Past Medical History:  Diagnosis Date  . Anemia   . Back pain, chronic    s/p fall 8 years ago  . Diabetes mellitus without complication ([place])    type 2  . Fibroids     Patient Active Problem List   Diagnosis Date Noted  . Fatigue 02/13/2018  . Dizziness 01/11/2018  . Type 2 diabetes mellitus without complication, without long-term current use of insulin ([place]) 12/01/2017  . Back pain, chronic     Past Surgical History:  Procedure Laterality Date  . NO PAST SURGERIES       OB History    Gravida  3   Para  3   Term  3   Preterm  0   AB  0   Living  3     SAB  0   TAB  0   Ectopic  0   Multiple      Live Births  3            Home Medications    Prior to Admission medications   Medication Sig Start Date End Date Taking? Authorizing Provider  Blood Glucose Monitoring Suppl (ONE TOUCH ULTRA 2) w/Device KIT Use to test blood glucose 01/12/18   Donnamae Jude, MD  glucose blood test strip Test BID 01/12/18   Donnamae Jude, MD  ibuprofen (ADVIL,MOTRIN) 600 MG tablet Take 1 tablet (600 mg total) by mouth  every 6 (six) hours as needed. 06/26/16   Donnel Saxon, CNM  Lancet Device MISC BID 01/12/18   Donnamae Jude, MD  Lancets [facility] ULTRASOFT) lancets Use as instructed 01/12/18   Donnamae Jude, MD  LORazepam (ATIVAN) 1 MG tablet Take 1 tablet (1 mg total) by mouth every 12 (twelve) hours as needed for anxiety. 02/26/18   Varney Biles, MD  Multiple Vitamin (MULTIVITAMIN) capsule Take 1 capsule by mouth daily.    [provider]    Family History Family History  Problem Relation Age of Onset  . Diabetes Father     Social History Social History   Tobacco Use  . Smoking status: Never Smoker  . Smokeless tobacco: Never Used  Substance Use Topics  . Alcohol use: No  . Drug use: No     Allergies   Patient has no known allergies.   Review of Systems Review of Systems  Constitutional: Positive for activity change.  Respiratory: Positive for shortness of breath.   Cardiovascular: Positive for chest pain.  Psychiatric/Behavioral: The patient is nervous/anxious.      Physical Exam Updated Vital Signs BP 126/82   Pulse  63   Temp 98.5 F (36.9 C) (Oral)   Resp (!) 22   Ht '5\' 3"'$  (1.6 m)   Wt 75.8 kg (167 lb)   LMP 02/03/2018 (Approximate)   SpO2 100%   BMI 29.58 kg/m   Physical Exam  Constitutional: She appears well-developed.  HENT:  Head: Normocephalic and atraumatic.  Eyes: EOM are normal.  Neck: Normal range of motion. Neck supple.  Cardiovascular: Normal rate.  Pulmonary/Chest: Effort normal.  Abdominal: Bowel sounds are normal.  Neurological: She is alert.  Skin: Skin is warm and dry.  Nursing note and vitals reviewed.    ED Treatments / Results  Labs (all labs ordered are listed, but only abnormal results are displayed) Labs Reviewed  CBC - Abnormal; Notable for the following components:      Result Value   Hemoglobin 11.1 (*)    HCT 33.2 (*)    All other components within normal limits  BASIC METABOLIC PANEL - Abnormal; Notable for the  following components:   Glucose, Bld 114 (*)    All other components within normal limits    EKG EKG Interpretation  Date/Time:  Monday Feb 26 2018 06:51:57 EDT Ventricular Rate:  73 PR Interval:    QRS Duration: 90 QT Interval:  377 QTC Calculation: 416 R Axis:   67 Text Interpretation:  Sinus rhythm Atrial premature complex No acute changes No significant change since last tracing Confirmed by Varney Biles [phone]) on 02/26/2018 6:56:56 AM   Radiology No results found.  Procedures Procedures (including critical care time)  Medications Ordered in ED Medications - No data to display   Initial Impression / Assessment and Plan / ED Course  I have reviewed the triage vital signs and the nursing notes.  Pertinent labs & imaging results that were available during my care of the patient were reviewed by me and considered in my medical decision making (see chart for details).     27 year old comes in with chief complaint of anxiety. Patient is having nonspecific symptoms.  At the moment she is symptom-free.  Her symptoms could be due to anxiety disorder.  Based on her history and exam it does not seem like she is having ACS or PE.  She has no risk factors for either of those conditions besides diabetes.  Patient also denies any substance abuse.  I have strongly urged her to see her PCP given that she has insurance and access.   We will give her a few Ativan to be taken as needed.  Final Clinical Impressions(s) / ED Diagnoses   Final diagnoses:  Nervousness    ED Discharge Orders        Ordered    LORazepam (ATIVAN) 1 MG tablet  Every 12 hours PRN     02/26/18 [place], [place], MD 02/27/18 [id number]

## 2018-03-05 ENCOUNTER — Encounter: Payer: Self-pay | Admitting: Family Medicine

## 2018-03-05 ENCOUNTER — Ambulatory Visit (INDEPENDENT_AMBULATORY_CARE_PROVIDER_SITE_OTHER): Payer: BLUE CROSS/BLUE SHIELD | Admitting: Family Medicine

## 2018-03-05 ENCOUNTER — Other Ambulatory Visit: Payer: Self-pay

## 2018-03-05 VITALS — BP 115/68 | HR 79 | Temp 98.5°F | Ht 63.0 in | Wt 171.8 lb

## 2018-03-05 DIAGNOSIS — R5383 Other fatigue: Secondary | ICD-10-CM

## 2018-03-05 DIAGNOSIS — R7303 Prediabetes: Secondary | ICD-10-CM | POA: Diagnosis not present

## 2018-03-05 DIAGNOSIS — Z719 Counseling, unspecified: Secondary | ICD-10-CM | POA: Diagnosis not present

## 2018-03-05 DIAGNOSIS — F411 Generalized anxiety disorder: Secondary | ICD-10-CM | POA: Diagnosis not present

## 2018-03-05 DIAGNOSIS — G4700 Insomnia, unspecified: Secondary | ICD-10-CM

## 2018-03-05 LAB — POCT GLYCOSYLATED HEMOGLOBIN (HGB A1C): Hemoglobin A1C: 5.2 % (ref 4.0–5.6)

## 2018-03-05 MED ORDER — FERROUS GLUCONATE 324 (38 FE) MG PO TABS: 324.0000 mg | ORAL_TABLET | ORAL | 3 refills | Status: DC

## 2018-03-05 MED ORDER — SERTRALINE HCL 25 MG PO TABS: 25.0000 mg | ORAL_TABLET | Freq: Every day | ORAL | 3 refills | Status: DC

## 2018-03-05 NOTE — Patient Instructions (Addendum)
Thank you for coming to see me today. It was a pleasure! Today we talked about:   Multivitamin for women from your pharmacy.  Twilight filter for you phone before going to bed if you are not able to stop looking at it 30 minutes before. Melatonin 5 mg every night before bed to help with sleep.   I have also sent iron for your anemia. Please take one every other day.   Zoloft has been sent to your pharmacy. Please let me know if you have any thought of hurting yourself or others or any other side effects.  Please follow-up with me in 6 weeks or as needed.  If you have any questions or concerns, please do not hesitate to call the office at [phone].  Take Care,   [place] Hazelle Woollard, DO  Sertraline tablets What is this medicine? SERTRALINE (SER tra leen) is used to treat depression. It may also be used to treat obsessive compulsive disorder, panic disorder, post-trauma stress, premenstrual dysphoric disorder (PMDD) or social anxiety. This medicine may be used for other purposes; ask your health care provider or pharmacist if you have questions. COMMON BRAND NAME(S): Zoloft What should I tell my health care provider before I take this medicine? They need to know if you have any of these conditions: -bleeding disorders -bipolar disorder or a family history of bipolar disorder -glaucoma -heart disease -high blood pressure -history of irregular heartbeat -history of low levels of calcium, magnesium, or potassium in the blood -if you often drink alcohol -liver disease -receiving electroconvulsive therapy -seizures -suicidal thoughts, plans, or attempt; a previous suicide attempt by you or a family member -take medicines that treat or prevent blood clots -thyroid disease -an unusual or allergic reaction to sertraline, other medicines, foods, dyes, or preservatives -pregnant or trying to get pregnant -breast-feeding How should I use this medicine? Take this medicine by mouth with a  glass of water. Follow the directions on the prescription label. You can take it with or without food. Take your medicine at regular intervals. Do not take your medicine more often than directed. Do not stop taking this medicine suddenly except upon the advice of your doctor. Stopping this medicine too quickly may cause serious side effects or your condition may worsen. A special MedGuide will be given to you by the pharmacist with each prescription and refill. Be sure to read this information carefully each time. Talk to your pediatrician regarding the use of this medicine in children. While this drug may be prescribed for children as young as 7 years for selected conditions, precautions do apply. Overdosage: If you think you have taken too much of this medicine contact a poison control center or emergency room at once. NOTE: This medicine is only for you. Do not share this medicine with others. What if I miss a dose? If you miss a dose, take it as soon as you can. If it is almost time for your next dose, take only that dose. Do not take double or extra doses. What may interact with this medicine? Do not take this medicine with any of the following medications: -cisapride -dofetilide -dronedarone -linezolid -MAOIs like Carbex, Eldepryl, Marplan, Nardil, and Parnate -methylene blue (injected into a vein) -pimozide -thioridazine This medicine may also interact with the following medications: -alcohol -amphetamines -aspirin and aspirin-like medicines -certain medicines for depression, anxiety, or psychotic disturbances -certain medicines for fungal infections like ketoconazole, fluconazole, posaconazole, and itraconazole -certain medicines for irregular heart beat like flecainide, quinidine, propafenone -  certain medicines for migraine headaches like almotriptan, eletriptan, frovatriptan, naratriptan, rizatriptan, sumatriptan, zolmitriptan -certain medicines for sleep -certain medicines for  seizures like carbamazepine, valproic acid, phenytoin -certain medicines that treat or prevent blood clots like warfarin, enoxaparin, dalteparin -cimetidine -digoxin -diuretics -fentanyl -isoniazid -lithium -NSAIDs, medicines for pain and inflammation, like ibuprofen or naproxen -other medicines that prolong the QT interval (cause an abnormal heart rhythm) -rasagiline -safinamide -supplements like St. John's wort, kava kava, valerian -tolbutamide -tramadol -tryptophan This list may not describe all possible interactions. Give your health care provider a list of all the medicines, herbs, non-prescription drugs, or dietary supplements you use. Also tell them if you smoke, drink alcohol, or use illegal drugs. Some items may interact with your medicine. What should I watch for while using this medicine? Tell your doctor if your symptoms do not get better or if they get worse. Visit your doctor or health care professional for regular checks on your progress. Because it may take several weeks to see the full effects of this medicine, it is important to continue your treatment as prescribed by your doctor. Patients and their families should watch out for new or worsening thoughts of suicide or depression. Also watch out for sudden changes in feelings such as feeling anxious, agitated, panicky, irritable, hostile, aggressive, impulsive, severely restless, overly excited and hyperactive, or not being able to sleep. If this happens, especially at the beginning of treatment or after a change in dose, call your health care professional. Dennis Bast may get drowsy or dizzy. Do not drive, use machinery, or do anything that needs mental alertness until you know how this medicine affects you. Do not stand or sit up quickly, especially if you are an older patient. This reduces the risk of dizzy or fainting spells. Alcohol may interfere with the effect of this medicine. Avoid alcoholic drinks. Your mouth may get dry.  Chewing sugarless gum or sucking hard candy, and drinking plenty of water may help. Contact your doctor if the problem does not go away or is severe. What side effects may I notice from receiving this medicine? Side effects that you should report to your doctor or health care professional as soon as possible: -allergic reactions like skin rash, itching or hives, swelling of the face, lips, or tongue -anxious -black, tarry stools -changes in vision -confusion -elevated mood, decreased need for sleep, racing thoughts, impulsive behavior -eye pain -fast, irregular heartbeat -feeling faint or lightheaded, falls -feeling agitated, angry, or irritable -hallucination, loss of contact with reality -loss of balance or coordination -loss of memory -painful or prolonged erections -restlessness, pacing, inability to keep still -seizures -stiff muscles -suicidal thoughts or other mood changes -trouble sleeping -unusual bleeding or bruising -unusually weak or tired -vomiting Side effects that usually do not require medical attention (report to your doctor or health care professional if they continue or are bothersome): -change in appetite or weight -change in sex drive or performance -diarrhea -increased sweating -indigestion, nausea -tremors This list may not describe all possible side effects. Call your doctor for medical advice about side effects. You may report side effects to FDA at 1-800-FDA-1088. Where should I keep my medicine? Keep out of the reach of children. Store at room temperature between 15 and 30 degrees C (59 and 86 degrees F). Throw away any unused medicine after the expiration date. NOTE: This sheet is a summary. It may not cover all possible information. If you have questions about this medicine, talk to your doctor, pharmacist, or health care  provider.  2018 Elsevier/Gold Standard (2016-10-07 14:17:49)

## 2018-03-05 NOTE — Progress Notes (Signed)
Subjective:    Patient ID: Jasmin Ellis, female    DOB: July 15, 1991, 27 y.o.   MRN: [id number]   CC: insomnia  HPI: Insomnia: -Patient reports trouble falling asleep and staying asleep for about 1 week - Has routine prior to bed - no TV in bedroom but does watch phone prior to bed - Trouble worsening over past week - patient worrying about her health  Healthcare Maintenance: - Patient would like referral to dentist - Uses condoms for birth control and not interested in other forms of contraception  Fatigue: - Patient reports still having trouble feeling tired, may be attributed to her not sleeping as well, but just feels tired most of the time - Discussed at previous visits and patient had blood drawn which showed minimally low hemoglobin on 5/13 of 11.1.  Breast Pain: - Patient reports crampy pain in breasts for 3 weeks - Intermittent pain that comes in goes in both breast - No discharge - Sometimes worse with her period but not always - Not tried any medications - no family history of breast cancer - Last period was 4/22 and was normal for her  - Previously breast fed, but last time was 1 year ago  Smoking status reviewed  Review of Systems Per HPI  Patient Active Problem List   Diagnosis Date Noted  . Insomnia 03/10/2018  . Generalized anxiety disorder 03/10/2018  . Fatigue 02/13/2018  . Dizziness 01/11/2018  . Back pain, chronic      Objective:  BP 115/68 (BP Location: Right Arm)   Pulse 79   Temp 98.5 F (36.9 C) (Oral)   Ht 5\' 3"  (1.6 m)   Wt 171 lb 12.8 oz (77.9 kg)   LMP 02/03/2018 (Approximate)   BMI 30.43 kg/m  Vitals and nursing note reviewed  General: NAD, pleasant Cardiac: RRR, normal heart sounds, no murmurs Respiratory: CTAB, normal effort Abdomen: soft, nontender, nondistended Extremities: no edema or cyanosis. WWP. Skin: warm and dry, no rashes noted Neuro: alert and oriented, no focal deficits Psych: normal affect  GAD 7 :  Generalized Anxiety Score 03/05/2018  Nervous, Anxious, on Edge 3  Control/stop worrying 3  Worry too much - different things 3  Trouble relaxing 2  Restless 3  Easily annoyed or irritable 0  Afraid - awful might happen 0  Total GAD 7 Score 14  Anxiety Difficulty Not difficult at all    Assessment & Plan:    Fatigue Patient fatigue improving from last office visit, but recent CBC with low hemoglobin to 11.1. Will start every other day iron supplementation. Patient educated on dark stools as side effect. Will continue to monitor and follow up in 6 weeks.   Insomnia Patient will try melatonin nightly, in addition to looking at no screens 30 minutes prior to bed or downloading filter on phone. Of note, GAD 7 score 14 today and seems to play a role in her ability to not fall asleep. Will start zoloft as well for anxiety.   Generalized anxiety disorder GAD-7 14. Patient denies any history or bipolar, anxiety or depression. No fmaily history of bipolar. No SI or HI. Patient interested in starting medication to help with her anxiety, which stems a lot from worrying about her health. Reassured and will start her on Zoloft 25 mg daily. She is to follow up in 6 weeks and side effects were discussed, as well as handout given. Patient to call 911 or the office is she has any thought of hurting herself  or others.   Health Maintenance: Patient's A1c 5.2 today with diet alone. Will refer to dentist.   Breast Pain: Patient reassured today given history and no family history of breast cancer. She will try ibuprofen for pain. If pain continues, patient to return for breast exam.   [place] Gisel Vipond, DO Family Medicine Resident PGY-1

## 2018-03-07 ENCOUNTER — Encounter: Payer: Self-pay | Admitting: Family Medicine

## 2018-03-08 ENCOUNTER — Other Ambulatory Visit: Payer: Self-pay | Admitting: Family Medicine

## 2018-03-08 ENCOUNTER — Encounter: Payer: Self-pay | Admitting: Family Medicine

## 2018-03-08 MED ORDER — FERROUS GLUCONATE 324 (38 FE) MG PO TABS: 324.0000 mg | ORAL_TABLET | ORAL | 3 refills | Status: DC

## 2018-03-08 MED ORDER — SERTRALINE HCL 25 MG PO TABS: 25.0000 mg | ORAL_TABLET | Freq: Every day | ORAL | 3 refills | Status: DC

## 2018-03-10 ENCOUNTER — Encounter: Payer: Self-pay | Admitting: Family Medicine

## 2018-03-10 DIAGNOSIS — F418 Other specified anxiety disorders: Secondary | ICD-10-CM | POA: Insufficient documentation

## 2018-03-10 DIAGNOSIS — G4700 Insomnia, unspecified: Secondary | ICD-10-CM | POA: Insufficient documentation

## 2018-03-10 NOTE — Assessment & Plan Note (Signed)
Patient fatigue improving from last office visit, but recent CBC with low hemoglobin to 11.1. Will start every other day iron supplementation. Patient educated on dark stools as side effect. Will continue to monitor and follow up in 6 weeks.

## 2018-03-10 NOTE — Assessment & Plan Note (Signed)
GAD-7 14. Patient denies any history or bipolar, anxiety or depression. No fmaily history of bipolar. No SI or HI. Patient interested in starting medication to help with her anxiety, which stems a lot from worrying about her health. Reassured and will start her on Zoloft 25 mg daily. She is to follow up in 6 weeks and side effects were discussed, as well as handout given. Patient to call 911 or the office is she has any thought of hurting herself or others.

## 2018-03-10 NOTE — Assessment & Plan Note (Signed)
Patient will try melatonin nightly, in addition to looking at no screens 30 minutes prior to bed or downloading filter on phone. Of note, GAD 7 score 14 today and seems to play a role in her ability to not fall asleep. Will start zoloft as well for anxiety.

## 2018-04-11 ENCOUNTER — Telehealth: Payer: Self-pay | Admitting: *Deleted

## 2018-04-11 NOTE — Telephone Encounter (Signed)
Pt walks to ask about the excessive spitting she is having. She states that her sister thinks that is is acid reflux.   Pt does describe a burning sensation and the need to "spit". She is not having CP, SOB, Arm numbness etc.   No appts for today.  Appt made tomorrow with Dr. Yisroel Ramming.  Tiffanee Mcnee, Salome Spotted, CMA

## 2018-04-12 ENCOUNTER — Ambulatory Visit (INDEPENDENT_AMBULATORY_CARE_PROVIDER_SITE_OTHER): Payer: BLUE CROSS/BLUE SHIELD | Admitting: Family Medicine

## 2018-04-12 ENCOUNTER — Encounter: Payer: Self-pay | Admitting: Family Medicine

## 2018-04-12 ENCOUNTER — Other Ambulatory Visit: Payer: Self-pay

## 2018-04-12 VITALS — BP 122/80 | HR 76 | Temp 98.6°F | Ht 63.0 in | Wt 168.8 lb

## 2018-04-12 DIAGNOSIS — J069 Acute upper respiratory infection, unspecified: Secondary | ICD-10-CM | POA: Diagnosis not present

## 2018-04-12 DIAGNOSIS — R1013 Epigastric pain: Secondary | ICD-10-CM | POA: Diagnosis not present

## 2018-04-12 LAB — POCT H PYLORI SCREEN: H Pylori Screen, POC: NEGATIVE

## 2018-04-12 NOTE — Patient Instructions (Addendum)
Thank you for coming in to see [place] today. Please see below to review our plan for today's visit.  1.  Your symptoms seem to be consistent with a viral infection.  These illnesses typically last 5-7 days.  If your symptoms last longer than 10 days, please return to the clinic.  You may take Tylenol or ibuprofen as needed for your aches.  Drink plenty of fluids.  You may have a decreased appetite while you have been ill. 2.  Your 1 month of decreased appetite may be related to acid reflux.  I would like you to try over-the-counter Zantac 75 mg twice daily.  He can get this at your local pharmacy for cheap price. 3.  If your symptoms do not improve after 1 month, I would like you to return to the clinic for further evaluation.  Please call the clinic at [phone] if your symptoms worsen or you have any concerns. It was our pleasure to serve you.  Harriet [place], [place], PGY-2

## 2018-04-12 NOTE — Assessment & Plan Note (Signed)
Subacute.  Symptoms of decreased appetite for approximately 1 month and bad taste in mouth.  Patient has no prior history of reflux.  H. pylori negative. - Advised patient to trial with Zantac 75 mg twice daily - RTC 1 month if symptoms do not improve

## 2018-04-12 NOTE — Assessment & Plan Note (Signed)
Acute.  Patient with viral-like symptoms including increased phlegm production and generalized fatigue and aches.  Signs of secondary bacterial infection. - Advised patient to use Tylenol and add ibuprofen for pain and increase fluid intake - Reviewed return precautions

## 2018-04-12 NOTE — Progress Notes (Signed)
   Subjective   Patient ID: Zenita Bedel    DOB: [birth year], 27 y.o. female   MRN: [id number]  CC: "Increased sputum production"  HPI: Season Bordeau is a 27 y.o. female who presents for a same day appointment for the following:  Increased sputum production: Patient endorsing 2 days of increased sputum production when sensation of her "chest moving on the inside."  She also endorses diffuse muscle aches and some stomach pain.  Patient denies runny nose, fever, shortness of breath, chest pain, nausea, vomiting or diarrhea.  She is a non-smoker.  Patient does endorse 1 month of decreased appetite and some bad taste in her mouth.  She has no weight change since her last visit.  ROS: see HPI for pertinent.  [place]: GAD, dizziness, insomnia, fatigue, chronic back pain, fibroids, anemia.  Surgical history remarkable.  Only history diabetes.  Smoking status reviewed. Medications reviewed.  Objective   BP 122/80   Pulse 76   Temp 98.6 F (37 C) (Oral)   Ht 5\' 3"  (1.6 m)   Wt 168 lb 12.8 oz (76.6 kg)   SpO2 99%   BMI 29.90 kg/m  Vitals and nursing note reviewed.  General: well nourished, well developed, NAD with non-toxic appearance HEENT: normocephalic, atraumatic, moist mucous membranes Neck: supple, non-tender without lymphadenopathy Cardiovascular: regular rate and rhythm without murmurs, rubs, or gallops Lungs: clear to auscultation bilaterally with normal work of breathing Abdomen: soft, non-tender, non-distended, normoactive bowel sounds Skin: warm, dry, no rashes or lesions, cap refill < 2 seconds Extremities: warm and well perfused, normal tone, no edema  Assessment & Plan   Viral URI Acute.  Patient with viral-like symptoms including increased phlegm production and generalized fatigue and aches.  Signs of secondary bacterial infection. - Advised patient to use Tylenol and add ibuprofen for pain and increase fluid intake - Reviewed return  precautions  Dyspepsia Subacute.  Symptoms of decreased appetite for approximately 1 month and bad taste in mouth.  Patient has no prior history of reflux.  H. pylori negative. - Advised patient to trial with Zantac 75 mg twice daily - RTC 1 month if symptoms do not improve  Orders Placed This Encounter  Procedures  . H. pylori Screen   No orders of the defined types were placed in this encounter.   Harriet Butte, [place], PGY-2 04/12/2018, 4:56 PM

## 2018-07-15 ENCOUNTER — Emergency Department (HOSPITAL_COMMUNITY)
Admission: EM | Admit: 2018-07-15 | Discharge: 2018-07-15 | Disposition: A | Discharge status: Home / Self Care | Payer: BLUE CROSS/BLUE SHIELD | Attending: Emergency Medicine | Admitting: Emergency Medicine

## 2018-07-15 ENCOUNTER — Other Ambulatory Visit: Payer: Self-pay

## 2018-07-15 DIAGNOSIS — R070 Pain in throat: Secondary | ICD-10-CM | POA: Diagnosis present

## 2018-07-15 DIAGNOSIS — Z79899 Other long term (current) drug therapy: Secondary | ICD-10-CM | POA: Insufficient documentation

## 2018-07-15 DIAGNOSIS — J020 Streptococcal pharyngitis: Secondary | ICD-10-CM | POA: Diagnosis not present

## 2018-07-15 LAB — GROUP A STREP BY PCR: GROUP A STREP BY PCR: DETECTED — AB

## 2018-07-15 LAB — CBG MONITORING, ED: Glucose-Capillary: 97 mg/dL (ref 70–99)

## 2018-07-15 MED ORDER — ACETAMINOPHEN-CODEINE 120-12 MG/5ML PO SOLN: 10.0000 mL | ORAL | 0 refills | Status: DC | PRN

## 2018-07-15 MED ORDER — ACETAMINOPHEN 325 MG PO TABS
650.0000 mg | ORAL_TABLET | Freq: Once | ORAL | Status: AC | PRN
  Administered 2018-07-15: 650 mg via ORAL
  Filled 2018-07-15: qty 2

## 2018-07-15 MED ORDER — PENICILLIN G BENZATHINE 1200000 UNIT/2ML IM SUSP
1.2000 10*6.[IU] | Freq: Once | INTRAMUSCULAR | Status: AC
  Administered 2018-07-15: 1.2 10*6.[IU] via INTRAMUSCULAR
  Filled 2018-07-15: qty 2

## 2018-07-15 MED ORDER — SODIUM CHLORIDE 0.9 % IV BOLUS
1000.0000 mL | Freq: Once | INTRAVENOUS | Status: AC
  Administered 2018-07-15: 1000 mL via INTRAVENOUS

## 2018-07-15 NOTE — ED Triage Notes (Signed)
Patient c/o sore throat that started yesterday; patient states that she "has energy to do nothing, I cannot swallow the pain is too great".

## 2018-07-15 NOTE — ED Provider Notes (Signed)
[facility] EMERGENCY DEPARTMENT Provider Note   CSN: [id number] Arrival date & time: 07/15/18  5916     History   Chief Complaint Chief Complaint  Patient presents with  . Sore Throat    HPI Jasmin Ellis is a 27 y.o. female.  HPI Patient presents to the emergency department with sore throat that started yesterday.  The patient states that she has pain with swallowing and feels like her tonsils are swollen.  Patient states that she did not take any medication prior to arrival.  She states that she has had some fever she feels like at home.  The patient denies chest pain, shortness of breath, headache,blurred vision, neck pain,  cough,  numbness, dizziness, anorexia, edema, abdominal pain, nausea, vomiting, diarrhea, rash, back pain, dysuria, hematemesis, bloody stool, near syncope, or syncope. Past Medical History:  Diagnosis Date  . Anemia   . Back pain, chronic    s/p fall 8 years ago  . Fibroids     Patient Active Problem List   Diagnosis Date Noted  . Viral URI 04/12/2018  . Dyspepsia 04/12/2018  . Insomnia 03/10/2018  . Generalized anxiety disorder 03/10/2018  . Fatigue 02/13/2018  . Dizziness 01/11/2018  . Back pain, chronic     Past Surgical History:  Procedure Laterality Date  . NO PAST SURGERIES       OB History    Gravida  3   Para  3   Term  3   Preterm  0   AB  0   Living  3     SAB  0   TAB  0   Ectopic  0   Multiple      Live Births  3            Home Medications    Prior to Admission medications   Medication Sig Start Date End Date Taking? Authorizing Provider  Blood Glucose Monitoring Suppl (ONE TOUCH ULTRA 2) w/Device KIT Use to test blood glucose 01/12/18   Donnamae Jude, MD  ferrous gluconate (FERGON) 324 MG tablet Take 1 tablet (324 mg total) by mouth every other day. 03/08/18   Shirley, [place], DO  glucose blood test strip Test BID 01/12/18   Donnamae Jude, MD  ibuprofen (ADVIL,MOTRIN) 600 MG  tablet Take 1 tablet (600 mg total) by mouth every 6 (six) hours as needed. 06/26/16   Donnel Saxon, CNM  Lancet Device MISC BID 01/12/18   Donnamae Jude, MD  Lancets [facility] ULTRASOFT) lancets Use as instructed 01/12/18   Donnamae Jude, MD  LORazepam (ATIVAN) 1 MG tablet Take 1 tablet (1 mg total) by mouth every 12 (twelve) hours as needed for anxiety. Patient not taking: Reported on 04/12/2018 02/26/18   Varney Biles, MD  Multiple Vitamin (MULTIVITAMIN) capsule Take 1 capsule by mouth daily.    [provider]  sertraline (ZOLOFT) 25 MG tablet Take 1 tablet (25 mg total) by mouth daily. 03/08/18   Shirley, [place], DO    Family History Family History  Problem Relation Age of Onset  . Diabetes Father     Social History Social History   Tobacco Use  . Smoking status: Never Smoker  . Smokeless tobacco: Never Used  Substance Use Topics  . Alcohol use: No  . Drug use: No     Allergies   Patient has no known allergies.   Review of Systems Review of Systems All other systems negative except as documented in the  HPI. All pertinent positives and negatives as reviewed in the HPI.  Physical Exam Updated Vital Signs BP 130/90   Pulse (!) 108   Temp (!) 102.7 F (39.3 C) (Oral)   Resp 20   Ht '5\' 5"'$  (1.651 m)   Wt 77.1 kg   SpO2 100%   BMI 28.29 kg/m   Physical Exam  Constitutional: She is oriented to person, place, and time. She appears well-developed and well-nourished. No distress.  HENT:  Head: Normocephalic and atraumatic.  Mouth/Throat: No uvula swelling. Oropharyngeal exudate, posterior oropharyngeal edema and posterior oropharyngeal erythema present. No tonsillar abscesses. Tonsils are 2+ on the right. Tonsils are 2+ on the left. Tonsillar exudate.  Eyes: Pupils are equal, round, and reactive to light.  Neck: Normal range of motion. Neck supple.  Cardiovascular: Normal rate, regular rhythm and normal heart sounds. Exam reveals no gallop and no friction  rub.  No murmur heard. Pulmonary/Chest: Effort normal and breath sounds normal. No respiratory distress. She has no wheezes.  Abdominal: Soft. Bowel sounds are normal. She exhibits no distension. There is no tenderness.  Neurological: She is alert and oriented to person, place, and time. She exhibits normal muscle tone. Coordination normal.  Skin: Skin is warm and dry. Capillary refill takes less than 2 seconds. No rash noted. No erythema.  Psychiatric: She has a normal mood and affect. Her behavior is normal.  Nursing note and vitals reviewed.    ED Treatments / Results  Labs (all labs ordered are listed, but only abnormal results are displayed) Labs Reviewed  GROUP A STREP BY PCR - Abnormal; Notable for the following components:      Result Value   Group A Strep by PCR DETECTED (*)    All other components within normal limits  CBG MONITORING, ED    EKG None  Radiology No results found.  Procedures Procedures (including critical care time)  Medications Ordered in ED Medications  sodium chloride 0.9 % bolus 1,000 mL (1,000 mLs Intravenous New Bag/Given 07/15/18 0835)  acetaminophen (TYLENOL) tablet 650 mg (650 mg Oral Given 07/15/18 0631)  penicillin g benzathine (BICILLIN LA) 1200000 UNIT/2ML injection 1.2 Million Units (1.2 Million Units Intramuscular Given 07/15/18 0847)     Initial Impression / Assessment and Plan / ED Course  I have reviewed the triage vital signs and the nursing notes.  Pertinent labs & imaging results that were available during my care of the patient were reviewed by me and considered in my medical decision making (see chart for details).     Patient be treated for strep pharyngitis with a shot of Bicillin long-acting.  Also given IV fluids for hydration.  Patient will be given medication for pain control at home.  Told to take Tylenol Motrin for fever and pain.  Final Clinical Impressions(s) / ED Diagnoses   Final diagnoses:  None    ED  Discharge Orders    None       Dalia Heading, PA-C 07/15/18 0935    Julianne Rice, MD 07/15/18 1235

## 2018-07-15 NOTE — ED Notes (Signed)
Pt informed she is type II diabetic

## 2018-07-15 NOTE — Discharge Instructions (Addendum)
Tylenol and Motrin for fever and pain.  Return here as needed for any worsening in your condition

## 2018-08-26 ENCOUNTER — Encounter (HOSPITAL_COMMUNITY): Payer: Self-pay

## 2018-08-26 ENCOUNTER — Other Ambulatory Visit: Payer: Self-pay

## 2018-08-26 ENCOUNTER — Emergency Department (HOSPITAL_COMMUNITY)
Admission: EM | Admit: 2018-08-26 | Discharge: 2018-08-26 | Disposition: A | Discharge status: Home / Self Care | Payer: BLUE CROSS/BLUE SHIELD | Attending: Emergency Medicine | Admitting: Emergency Medicine

## 2018-08-26 ENCOUNTER — Emergency Department (HOSPITAL_COMMUNITY): Payer: BLUE CROSS/BLUE SHIELD

## 2018-08-26 DIAGNOSIS — R0602 Shortness of breath: Secondary | ICD-10-CM | POA: Insufficient documentation

## 2018-08-26 DIAGNOSIS — R42 Dizziness and giddiness: Secondary | ICD-10-CM | POA: Insufficient documentation

## 2018-08-26 DIAGNOSIS — R531 Weakness: Secondary | ICD-10-CM | POA: Diagnosis present

## 2018-08-26 DIAGNOSIS — Z79899 Other long term (current) drug therapy: Secondary | ICD-10-CM | POA: Insufficient documentation

## 2018-08-26 LAB — COMPREHENSIVE METABOLIC PANEL
ALBUMIN: 3.9 g/dL (ref 3.5–5.0)
ALT: 13 U/L (ref 0–44)
AST: 15 U/L (ref 15–41)
Alkaline Phosphatase: 38 U/L (ref 38–126)
Anion gap: 6 (ref 5–15)
BUN: 10 mg/dL (ref 6–20)
CO2: 24 mmol/L (ref 22–32)
CREATININE: 0.55 mg/dL (ref 0.44–1.00)
Calcium: 9.3 mg/dL (ref 8.9–10.3)
Chloride: 103 mmol/L (ref 98–111)
GFR calc non Af Amer: >60 mL/min (ref >60)
GLUCOSE: 105 mg/dL — AB (ref 70–99)
Potassium: 3.6 mmol/L (ref 3.5–5.1)
SODIUM: 133 mmol/L — AB (ref 135–145)
Total Bilirubin: 0.5 mg/dL (ref 0.3–1.2)
Total Protein: 7.5 g/dL (ref 6.5–8.1)

## 2018-08-26 LAB — CBC WITH DIFFERENTIAL/PLATELET
Abs Immature Granulocytes: 0.01 10*3/uL (ref 0.00–0.07)
Basophils Absolute: 0.1 10*3/uL (ref 0.0–0.1)
Basophils Relative: 1 %
EOS PCT: 8 %
Eosinophils Absolute: 0.5 10*3/uL (ref 0.0–0.5)
HEMATOCRIT: 40.1 % (ref 36.0–46.0)
HEMOGLOBIN: 12.8 g/dL (ref 12.0–15.0)
Immature Granulocytes: 0 %
LYMPHS ABS: 3 10*3/uL (ref 0.7–4.0)
LYMPHS PCT: 45 %
MCH: 27.9 pg (ref 26.0–34.0)
MCHC: 31.9 g/dL (ref 30.0–36.0)
MCV: 87.6 fL (ref 80.0–100.0)
MONO ABS: 0.3 10*3/uL (ref 0.1–1.0)
Monocytes Relative: 5 %
NRBC: 0 % (ref 0.0–0.2)
Neutro Abs: 2.7 10*3/uL (ref 1.7–7.7)
Neutrophils Relative %: 41 %
Platelets: 323 10*3/uL (ref 150–400)
RBC: 4.58 MIL/uL (ref 3.87–5.11)
RDW: 12.6 % (ref 11.5–15.5)
WBC: 6.6 10*3/uL (ref 4.0–10.5)

## 2018-08-26 LAB — URINALYSIS, ROUTINE W REFLEX MICROSCOPIC
BILIRUBIN URINE: NEGATIVE
Glucose, UA: NEGATIVE mg/dL
HGB URINE DIPSTICK: NEGATIVE
Ketones, ur: NEGATIVE mg/dL
Leukocytes, UA: NEGATIVE
Nitrite: NEGATIVE
PH: 7 (ref 5.0–8.0)
Protein, ur: NEGATIVE mg/dL
SPECIFIC GRAVITY, URINE: 1.006 (ref 1.005–1.030)

## 2018-08-26 LAB — I-STAT BETA HCG BLOOD, ED (MC, WL, AP ONLY): I-stat hCG, quantitative: <5 m[IU]/mL (ref <5)

## 2018-08-26 LAB — CBG MONITORING, ED: Glucose-Capillary: 85 mg/dL (ref 70–99)

## 2018-08-26 MED ORDER — SODIUM CHLORIDE 0.9 % IV BOLUS
1000.0000 mL | Freq: Once | INTRAVENOUS | Status: AC
  Administered 2018-08-26: 1000 mL via INTRAVENOUS

## 2018-08-26 NOTE — ED Notes (Signed)
Patient transported to X-ray 

## 2018-08-26 NOTE — ED Triage Notes (Signed)
Pt states she has been dizzy, weak all over and short of breath since last evening. Pt denies any chest pain. Pt states that she has also been having back pain for three months.

## 2018-08-26 NOTE — ED Notes (Signed)
Pt ambulated in hallway with RN. Pt denied any dizziness during or following ambulation.

## 2018-08-26 NOTE — ED Provider Notes (Signed)
[facility] EMERGENCY DEPARTMENT Provider Note   CSN: [id number] Arrival date & time: 08/26/18  0841     History   Chief Complaint Chief Complaint  Patient presents with  . Weakness    generalized  . Dizziness  . Shortness of Breath    HPI Jasmin Ellis is a 27 y.o. female.  Patient is a 27 year old female with history of diabetes, anxiety and back pain who presents with dizziness and shortness of breath.  She states since yesterday she is felt dizzy where she feels like her head is woozy.  She is had no associated nausea or vomiting.  She feels weak all over.  She is also felt a little short of breath but she says that feels like it is more from the generalized weakness.  She has no urinary symptoms.  No vomiting or diarrhea.  No fevers.  No URI symptoms.  No chest pain or discomfort.  No abdominal pain.  Her last menstrual period was October 19.  She does have some left upper neck and back pain which is been there for about 3 months and is unchanged.  No numbness or weakness to her extremities.     Past Medical History:  Diagnosis Date  . Anemia   . Back pain, chronic    s/p fall 8 years ago  . Fibroids     Patient Active Problem List   Diagnosis Date Noted  . Viral URI 04/12/2018  . Dyspepsia 04/12/2018  . Insomnia 03/10/2018  . Generalized anxiety disorder 03/10/2018  . Fatigue 02/13/2018  . Dizziness 01/11/2018  . Back pain, chronic     Past Surgical History:  Procedure Laterality Date  . NO PAST SURGERIES       OB History    Gravida  3   Para  3   Term  3   Preterm  0   AB  0   Living  3     SAB  0   TAB  0   Ectopic  0   Multiple      Live Births  3            Home Medications    Prior to Admission medications   Medication Sig Start Date End Date Taking? Authorizing Provider  acetaminophen-codeine 120-12 MG/5ML solution Take 10 mLs by mouth every 4 (four) hours as needed for moderate pain. 07/15/18    Lawyer, Harrell Gave, PA-C  Blood Glucose Monitoring Suppl (ONE TOUCH ULTRA 2) w/Device KIT Use to test blood glucose 01/12/18   Donnamae Jude, MD  ferrous gluconate (FERGON) 324 MG tablet Take 1 tablet (324 mg total) by mouth every other day. 03/08/18   Shirley, [place], DO  glucose blood test strip Test BID 01/12/18   Donnamae Jude, MD  ibuprofen (ADVIL,MOTRIN) 600 MG tablet Take 1 tablet (600 mg total) by mouth every 6 (six) hours as needed. 06/26/16   Donnel Saxon, CNM  Lancet Device MISC BID 01/12/18   Donnamae Jude, MD  Lancets [facility] ULTRASOFT) lancets Use as instructed 01/12/18   Donnamae Jude, MD  LORazepam (ATIVAN) 1 MG tablet Take 1 tablet (1 mg total) by mouth every 12 (twelve) hours as needed for anxiety. Patient not taking: Reported on 04/12/2018 02/26/18   Varney Biles, MD  Multiple Vitamin (MULTIVITAMIN) capsule Take 1 capsule by mouth daily.    [provider]  sertraline (ZOLOFT) 25 MG tablet Take 1 tablet (25 mg total) by mouth daily. 03/08/18  Shirley, [place], DO    Family History Family History  Problem Relation Age of Onset  . Diabetes Father     Social History Social History   Tobacco Use  . Smoking status: Never Smoker  . Smokeless tobacco: Never Used  Substance Use Topics  . Alcohol use: No  . Drug use: No     Allergies   Patient has no known allergies.   Review of Systems Review of Systems  Constitutional: Positive for fatigue. Negative for chills, diaphoresis and fever.  HENT: Negative for congestion, rhinorrhea and sneezing.   Eyes: Negative.   Respiratory: Negative for cough, chest tightness and shortness of breath.   Cardiovascular: Negative for chest pain and leg swelling.  Gastrointestinal: Negative for abdominal pain, blood in stool, diarrhea, nausea and vomiting.  Genitourinary: Negative for difficulty urinating, flank pain, frequency and hematuria.  Musculoskeletal: Negative for arthralgias and back pain.  Skin: Negative for  rash.  Neurological: Positive for dizziness and weakness (Generalized). Negative for speech difficulty, numbness and headaches.     Physical Exam Updated Vital Signs BP 131/71   Pulse 65   Temp 97.9 F (36.6 C) (Oral)   Resp 14   Ht 5' 4.96" (1.65 m)   Wt 74.8 kg   LMP 08/04/2018   SpO2 100%   BMI 27.49 kg/m   Physical Exam  Constitutional: She is oriented to person, place, and time. She appears well-developed and well-nourished.  HENT:  Head: Normocephalic and atraumatic.  Eyes: Pupils are equal, round, and reactive to light.  Neck: Normal range of motion. Neck supple.  Cardiovascular: Normal rate, regular rhythm and normal heart sounds.  Pulmonary/Chest: Effort normal and breath sounds normal. No respiratory distress. She has no wheezes. She has no rales. She exhibits no tenderness.  Abdominal: Soft. Bowel sounds are normal. There is no tenderness. There is no rebound and no guarding.  Musculoskeletal: Normal range of motion. She exhibits no edema.  Mild tenderness to left upper back in musculature, just medial to the scapula  Lymphadenopathy:    She has no cervical adenopathy.  Neurological: She is alert and oriented to person, place, and time.  Skin: Skin is warm and dry. No rash noted.  Psychiatric: She has a normal mood and affect.     ED Treatments / Results  Labs (all labs ordered are listed, but only abnormal results are displayed) Labs Reviewed  COMPREHENSIVE METABOLIC PANEL - Abnormal; Notable for the following components:      Result Value   Sodium 133 (*)    Glucose, Bld 105 (*)    All other components within normal limits  URINALYSIS, ROUTINE W REFLEX MICROSCOPIC - Abnormal; Notable for the following components:   Color, Urine STRAW (*)    All other components within normal limits  CBC WITH DIFFERENTIAL/PLATELET  CBG MONITORING, ED  I-STAT BETA HCG BLOOD, ED ([facility], [facility], [facility] ONLY)    EKG EKG Interpretation  Date/Time:  Sunday August 26 2018  08:49:12 EST Ventricular Rate:  72 PR Interval:    QRS Duration: 89 QT Interval:  360 QTC Calculation: 394 R Axis:   62 Text Interpretation:  Sinus rhythm since last tracing no significant change Confirmed by Malvin Johns [phone]) on 08/26/2018 9:17:19 AM Also confirmed by Malvin Johns [phone]), editor Philomena Doheny [phone])  on 08/26/2018 9:19:39 AM   Radiology Dg Chest 2 View  Result Date: 08/26/2018 CLINICAL DATA:  27 year old female with dizziness, weakness and shortness of breath. EXAM: CHEST - 2 VIEW COMPARISON:  Prior chest x-ray and CT scan of the chest 01/15/2018 FINDINGS: The lungs are clear and negative for focal airspace consolidation, pulmonary edema or suspicious pulmonary nodule. No pleural effusion or pneumothorax. Cardiac and mediastinal contours are within normal limits. No acute fracture or lytic or blastic osseous lesions. The visualized upper abdominal bowel gas pattern is unremarkable. IMPRESSION: Normal chest x-ray. Electronically Signed   By: Jacqulynn Cadet M.D.   On: 08/26/2018 09:46    Procedures Procedures (including critical care time)  Medications Ordered in ED Medications  sodium chloride 0.9 % bolus 1,000 mL (1,000 mLs Intravenous New Bag/Given 08/26/18 0858)     Initial Impression / Assessment and Plan / ED Course  I have reviewed the triage vital signs and the nursing notes.  Pertinent labs & imaging results that were available during my care of the patient were reviewed by me and considered in my medical decision making (see chart for details).     Patient is a 27 year old female who presents with generalized weakness and dizziness.  Her labs are non-concerning.  She has no significant anemia.  Her electrolytes are okay.  Her glucose is good.  She has no evidence of a UTI.  Her pregnancy test is negative.  Her chest x-ray is clear without evidence of pneumonia.  Her EKG does not show any concerning abnormalities.  She was given IV fluids and  feels better.  She is able to ambulate without dizziness.  She has no ataxia or other neurologic deficits.  She was discharged home in good condition.  She was encouraged to follow-up with her PCP [facility].  Return precautions were given.  She does have some tenderness to her left upper back between the scapula and the spine.  It seems to be muscular in nature.  She has no radicular symptoms or neurologic deficits.  This is been going on for about 3 months.  She does not have any other symptoms that would be more concerning for PE.  She was advised in symptomatic care.  Final Clinical Impressions(s) / ED Diagnoses   Final diagnoses:  Generalized weakness    ED Discharge Orders    None       Malvin Johns, MD 08/26/18 1002

## 2018-09-26 ENCOUNTER — Ambulatory Visit: Payer: BLUE CROSS/BLUE SHIELD | Admitting: Family Medicine

## 2018-10-21 ENCOUNTER — Emergency Department (HOSPITAL_COMMUNITY)
Admission: EM | Admit: 2018-10-21 | Discharge: 2018-10-21 | Disposition: A | Discharge status: Home / Self Care | Payer: BLUE CROSS/BLUE SHIELD | Attending: Emergency Medicine | Admitting: Emergency Medicine

## 2018-10-21 ENCOUNTER — Encounter (HOSPITAL_COMMUNITY): Payer: Self-pay | Admitting: Emergency Medicine

## 2018-10-21 DIAGNOSIS — R51 Headache: Secondary | ICD-10-CM | POA: Diagnosis not present

## 2018-10-21 DIAGNOSIS — I10 Essential (primary) hypertension: Secondary | ICD-10-CM | POA: Diagnosis not present

## 2018-10-21 DIAGNOSIS — E119 Type 2 diabetes mellitus without complications: Secondary | ICD-10-CM | POA: Diagnosis not present

## 2018-10-21 DIAGNOSIS — Z79899 Other long term (current) drug therapy: Secondary | ICD-10-CM | POA: Insufficient documentation

## 2018-10-21 DIAGNOSIS — R519 Headache, unspecified: Secondary | ICD-10-CM

## 2018-10-21 HISTORY — DX: Anxiety disorder, unspecified: F41.9

## 2018-10-21 LAB — I-STAT CHEM 8, ED
BUN: 8 mg/dL (ref 6–20)
CHLORIDE: 103 mmol/L (ref 98–111)
CREATININE: 0.6 mg/dL (ref 0.44–1.00)
Calcium, Ion: 1.21 mmol/L (ref 1.15–1.40)
Glucose, Bld: 105 mg/dL — ABNORMAL HIGH (ref 70–99)
HEMATOCRIT: 35 % — AB (ref 36.0–46.0)
Hemoglobin: 11.9 g/dL — ABNORMAL LOW (ref 12.0–15.0)
POTASSIUM: 3.5 mmol/L (ref 3.5–5.1)
Sodium: 139 mmol/L (ref 135–145)
TCO2: 27 mmol/L (ref 22–32)

## 2018-10-21 LAB — I-STAT BETA HCG BLOOD, ED (MC, WL, AP ONLY): I-stat hCG, quantitative: <5 m[IU]/mL (ref <5)

## 2018-10-21 NOTE — ED Triage Notes (Signed)
Patient reports elevated blood pressure when she checked it at [facility] this evening 151/99 , pt. added intermittent headache with dizziness for 3 weeks , no nausea or fever .

## 2018-10-21 NOTE — ED Provider Notes (Signed)
[facility] EMERGENCY DEPARTMENT Provider Note   CSN: [id number] Arrival date & time: 10/21/18  1947     History   Chief Complaint Chief Complaint  Patient presents with  . Hypertension  . Headache    Dizziness    HPI Jasmin Ellis is a 28 y.o. female.  HPI Patient presented to the emergency room for evaluation of dizziness and hypertension.  Patient states she has had some intermittent dizziness for the last several weeks.  She has not seen anyone for it.  Patient also started developing a mild headache.  This evening while she was at [facility] she decided to double check her blood pressure.  It was elevated in the 150s.  Patient does not have a history of high blood pressure so she became concerned and decided to come to the ED.  She denies any trouble with her vision.  No trouble with her speech.  No focal numbness or weakness. Past Medical History:  Diagnosis Date  . Anemia   . Anxiety   . Back pain, chronic    s/p fall 8 years ago  . Diabetes mellitus without complication ([place])   . Fibroids     Patient Active Problem List   Diagnosis Date Noted  . Viral URI 04/12/2018  . Dyspepsia 04/12/2018  . Insomnia 03/10/2018  . Generalized anxiety disorder 03/10/2018  . Fatigue 02/13/2018  . Dizziness 01/11/2018  . Back pain, chronic     Past Surgical History:  Procedure Laterality Date  . NO PAST SURGERIES       OB History    Gravida  3   Para  3   Term  3   Preterm  0   AB  0   Living  3     SAB  0   TAB  0   Ectopic  0   Multiple      Live Births  3            Home Medications    Prior to Admission medications   Medication Sig Start Date End Date Taking? Authorizing Provider  ibuprofen (ADVIL,MOTRIN) 200 MG tablet Take 400 mg by mouth every 6 (six) hours as needed (pain).   Yes [provider]  acetaminophen-codeine 120-12 MG/5ML solution Take 10 mLs by mouth every 4 (four) hours as needed for moderate  pain. Patient not taking: Reported on 10/21/2018 07/15/18   Dalia Heading, PA-C  Blood Glucose Monitoring Suppl (ONE TOUCH ULTRA 2) w/Device KIT Use to test blood glucose 01/12/18   Donnamae Jude, MD  ferrous gluconate (FERGON) 324 MG tablet Take 1 tablet (324 mg total) by mouth every other day. Patient not taking: Reported on 10/21/2018 03/08/18   Shirley, [place], DO  glucose blood test strip Test BID 01/12/18   Donnamae Jude, MD  ibuprofen (ADVIL,MOTRIN) 600 MG tablet Take 1 tablet (600 mg total) by mouth every 6 (six) hours as needed. Patient not taking: Reported on 10/21/2018 06/26/16   Donnel Saxon, CNM  Lancet Device MISC BID 01/12/18   Donnamae Jude, MD  Lancets [facility] ULTRASOFT) lancets Use as instructed 01/12/18   Donnamae Jude, MD  LORazepam (ATIVAN) 1 MG tablet Take 1 tablet (1 mg total) by mouth every 12 (twelve) hours as needed for anxiety. Patient not taking: Reported on 04/12/2018 02/26/18   Varney Biles, MD  sertraline (ZOLOFT) 25 MG tablet Take 1 tablet (25 mg total) by mouth daily. Patient not taking: Reported on 10/21/2018 03/08/18  Shirley, [place], DO    Family History Family History  Problem Relation Age of Onset  . Diabetes Father     Social History Social History   Tobacco Use  . Smoking status: Never Smoker  . Smokeless tobacco: Never Used  Substance Use Topics  . Alcohol use: No  . Drug use: No     Allergies   Patient has no known allergies.   Review of Systems Review of Systems  All other systems reviewed and are negative.    Physical Exam Updated Vital Signs BP 135/87   Pulse 75   Temp 97.8 F (36.6 C) (Oral)   Resp 16   Ht 1.6 m ('5\' 3"'$ )   Wt 59 kg   LMP 10/02/2018   SpO2 100%   BMI 23.03 kg/m   Physical Exam Vitals signs and nursing note reviewed.  Constitutional:      General: She is not in acute distress.    Appearance: She is well-developed.  HENT:     Head: Normocephalic and atraumatic.     Right Ear: External ear  normal.     Left Ear: External ear normal.  Eyes:     General: No scleral icterus.       Right eye: No discharge.        Left eye: No discharge.     Conjunctiva/sclera: Conjunctivae normal.  Neck:     Musculoskeletal: Neck supple.     Trachea: No tracheal deviation.  Cardiovascular:     Rate and Rhythm: Normal rate and regular rhythm.  Pulmonary:     Effort: Pulmonary effort is normal. No respiratory distress.     Breath sounds: Normal breath sounds. No stridor. No wheezing or rales.  Abdominal:     General: Bowel sounds are normal. There is no distension.     Palpations: Abdomen is soft.     Tenderness: There is no abdominal tenderness. There is no guarding or rebound.  Musculoskeletal:        General: No tenderness.  Skin:    General: Skin is warm and dry.     Findings: No rash.  Neurological:     Mental Status: She is alert and oriented to person, place, and time.     Cranial Nerves: No cranial nerve deficit (No facial droop, extraocular movements intact, tongue midline ).     Sensory: No sensory deficit.     Motor: No abnormal muscle tone or seizure activity.     Coordination: Coordination normal.     Comments: No pronator drift bilateral upper extrem, able to hold both legs off bed for 5 seconds, sensation intact in all extremities, no visual field cuts, no left or right sided neglect, normal finger-nose exam bilaterally, no nystagmus noted       ED Treatments / Results  Labs (all labs ordered are listed, but only abnormal results are displayed) Labs Reviewed  I-STAT CHEM 8, ED - Abnormal; Notable for the following components:      Result Value   Glucose, Bld 105 (*)    Hemoglobin 11.9 (*)    HCT 35.0 (*)    All other components within normal limits  I-STAT BETA HCG BLOOD, ED ([facility], [facility], [facility] ONLY)    EKG EKG Interpretation  Date/Time:  Sunday October 21 2018 19:56:03 EST Ventricular Rate:  77 PR Interval:    QRS Duration: 91 QT Interval:  359 QTC  Calculation: 407 R Axis:   49 Text Interpretation:  Sinus rhythm No significant change  since last tracing Confirmed by Dorie Rank [phone]) on 10/21/2018 8:05:48 PM   Radiology No results found.  Procedures Procedures (including critical care time)  Medications Ordered in ED Medications - No data to display   Initial Impression / Assessment and Plan / ED Course  I have reviewed the triage vital signs and the nursing notes.  Pertinent labs & imaging results that were available during my care of the patient were reviewed by me and considered in my medical decision making (see chart for details).   Pt presented with concerns of HTN and headache.  BP was mildly elevated initially.  Normal neuro exam and headache was mild in nature.  Sx not concerning for [facility], stroke, meningitis or other emergency etiology.  BP returned to normal without intervention.  Reassured pt. Follow up with PCP as needed  Final Clinical Impressions(s) / ED Diagnoses   Final diagnoses:  Acute nonintractable headache, unspecified headache type    ED Discharge Orders    None       Dorie Rank, MD 10/21/18 2139

## 2018-10-21 NOTE — Discharge Instructions (Addendum)
Take tylenol as needed for headache, continue your current medications, follow up with your primary care doctor as we discussed

## 2018-10-24 ENCOUNTER — Ambulatory Visit (INDEPENDENT_AMBULATORY_CARE_PROVIDER_SITE_OTHER): Payer: BLUE CROSS/BLUE SHIELD | Admitting: Family Medicine

## 2018-10-24 ENCOUNTER — Encounter: Payer: Self-pay | Admitting: Family Medicine

## 2018-10-24 VITALS — BP 122/80 | HR 75 | Temp 98.4°F | Wt 181.2 lb

## 2018-10-24 DIAGNOSIS — Z23 Encounter for immunization: Secondary | ICD-10-CM | POA: Diagnosis not present

## 2018-10-24 DIAGNOSIS — G4700 Insomnia, unspecified: Secondary | ICD-10-CM | POA: Diagnosis not present

## 2018-10-24 DIAGNOSIS — F418 Other specified anxiety disorders: Secondary | ICD-10-CM | POA: Diagnosis not present

## 2018-10-24 MED ORDER — SERTRALINE HCL 25 MG PO TABS: 25.0000 mg | ORAL_TABLET | Freq: Every day | ORAL | 3 refills | Status: DC

## 2018-10-24 MED ORDER — HYDROXYZINE HCL 10 MG PO TABS: 10.0000 mg | ORAL_TABLET | Freq: Three times a day (TID) | ORAL | 0 refills | Status: DC | PRN

## 2018-10-24 NOTE — Patient Instructions (Addendum)
Thank you for coming to see me today. It was a pleasure! Today we talked about:   Melatonin may be used as need to help you sleep. Take it right before bed.   Please continue to talk your family and your sister about your feelings.   Please start taking zoloft every night to help your anxiety and your feelings.  I have given you Atarax to take AS NEEDED for your anxiety. Please do not drive after taking this for the first time as it may make you tired.  Certainly if you have any thoughts of hurting yourself or others, please call 911 or our office.   Use a heating pad and massage for your back pain. The stretches below may also help.   Please follow-up with me in 4-6 weeks or sooner as needed.  If you have any questions or concerns, please do not hesitate to call the office at [phone].  Take Care,   [place] Gene Colee, DO  10 LITTLE Things To Do When You're Feeling Too Down To Do Anything  Take a shower. Even if you plan to stay in all day long and not see a soul, take a shower. It takes the most effort to hop in to the shower but once you do, you'll feel immediate results. It will wake you up and you'll be feeling much fresher (and cleaner too).  Brush and floss your teeth. Give your teeth a good brushing with a floss finish. It's a small task but it feels so good and you can check 'taking care of your health' off the list of things to do.  Do something small on your list. Most of [place] have some small thing we would like to get done (load of laundry, sew a button, email a friend). Doing one of these things will make you feel like you've accomplished something.  Drink water. Drinking water is easy right? It's also really beneficial for your health so keep a glass beside you all day and take sips often. It gives you energy and prevents you from boredom eating.  Do some floor exercises. The last thing you want to do is exercise but it might be just the thing you need the most. Keep  it simple and do exercises that involve sitting or laying on the floor. Even the smallest of exercises release chemicals in the brain that make you feel good. Yoga stretches or core exercises are going to make you feel good with minimal effort.  Make your bed. Making your bed takes a few minutes but it's productive and you'll feel relieved when it's done. An unmade bed is a huge visual reminder that you're having an unproductive day. Do it and consider it your housework for the day.  Put on some nice clothes. Take the sweatpants off even if you don't plan to go anywhere. Put on clothes that make you feel good. Take a look in the mirror so your brain recognizes the sweatpants have been replaced with clothes that make you look great. It's an instant confidence booster.  Wash the dishes. A pile of dirty dishes in the sink is a reflection of your mood. It's possible that if you wash up the dishes, your mood will follow suit. It's worth a try.  Cook a real meal. If you have the luxury to have a "do nothing" day, you have time to make a real meal for yourself. Make a meal that you love to eat. The process is good to  get you out of the funk and the food will ensure you have more energy for tomorrow.  Write out your thoughts by hand. When you hand write, you stimulate your brain to focus on the moment that you're in so make yourself comfortable and write whatever comes into your mind. Put those thoughts out on paper so they stop spinning around in your head. Those thoughts might be the very thing holding you down.   Neck Exercises Neck exercises can be important for many reasons:  They can help you to improve and maintain flexibility in your neck. This can be especially important as you age.  They can help to make your neck stronger. This can make movement easier.  They can reduce or prevent neck pain.  They may help your upper back. Ask your health care provider which neck exercises would be  best for you. Exercises to improve neck flexibility Neck stretch Repeat this exercise 3-5 times. 1. Do this exercise while standing or while sitting in a chair. 2. Place your feet flat on the floor, shoulder-width apart. 3. Slowly turn your head to the right. Turn it all the way to the right so you can look over your right shoulder. Do not tilt or tip your head. 4. Hold this position for 10-30 seconds. 5. Slowly turn your head to the left, to look over your left shoulder. 6. Hold this position for 10-30 seconds.  Neck retraction Repeat this exercise 8-10 times. Do this 3-4 times a day or as told by your health care provider. 1. Do this exercise while standing or while sitting in a sturdy chair. 2. Look straight ahead. Do not bend your neck. 3. Use your fingers to push your chin backward. Do not bend your neck for this movement. Continue to face straight ahead. If you are doing the exercise properly, you will feel a slight sensation in your throat and a stretch at the back of your neck. 4. Hold the stretch for 1-2 seconds. Relax and repeat. Exercises to improve neck strength Neck press Repeat this exercise 10 times. Do it first thing in the morning and right before bed or as told by your health care provider. 1. Lie on your back on a firm bed or on the floor with a pillow under your head. 2. Use your neck muscles to push your head down on the pillow and straighten your spine. 3. Hold the position as well as you can. Keep your head facing up and your chin tucked. 4. Slowly count to 5 while holding this position. 5. Relax for a few seconds. Then repeat. Isometric strengthening Do a full set of these exercises 2 times a day or as told by your health care provider. 1. Sit in a supportive chair and place your hand on your forehead. 2. Push forward with your head and neck while pushing back with your hand. Hold for 10 seconds. 3. Relax. Then repeat the exercise 3 times. 4. Next, do  thesequence again, this time putting your hand against the back of your head. Use your head and neck to push backward against the hand pressure. 5. Finally, do the same exercise on either side of your head, pushing sideways against the pressure of your hand. Prone head lifts Repeat this exercise 5 times. Do this 2 times a day or as told by your health care provider. 1. Lie face-down, resting on your elbows so that your chest and upper back are raised. 2. Start with your head facing  downward, near your chest. Position your chin either on or near your chest. 3. Slowly lift your head upward. Lift until you are looking straight ahead. Then continue lifting your head as far back as you can stretch. 4. Hold your head up for 5 seconds. Then slowly lower it to your starting position. Supine head lifts Repeat this exercise 8-10 times. Do this 2 times a day or as told by your health care provider. 1. Lie on your back, bending your knees to point to the ceiling and keeping your feet flat on the floor. 2. Lift your head slowly off the floor, raising your chin toward your chest. 3. Hold for 5 seconds. 4. Relax and repeat. Scapular retraction Repeat this exercise 5 times. Do this 2 times a day or as told by your health care provider. 1. Stand with your arms at your sides. Look straight ahead. 2. Slowly pull both shoulders backward and downward until you feel a stretch between your shoulder blades in your upper back. 3. Hold for 10-30 seconds. 4. Relax and repeat. Contact a health care provider if:  Your neck pain or discomfort gets much worse when you do an exercise.  Your neck pain or discomfort does not improve within 2 hours after you exercise. If you have any of these problems, stop exercising right away. Do not do the exercises again unless your health care provider says that you can. Get help right away if:  You develop sudden, severe neck pain. If this happens, stop exercising right away. Do  not do the exercises again unless your health care provider says that you can. This information is not intended to replace advice given to you by your health care provider. Make sure you discuss any questions you have with your health care provider. Document Released: 09/14/2015 Document Revised: 02/06/2018 Document Reviewed: 04/13/2015 Elsevier Interactive Patient Education  2019 [employer].

## 2018-10-24 NOTE — Progress Notes (Signed)
Subjective:    Patient ID: Jasmin Ellis, female    DOB: Dec 07, 1990, 28 y.o.   MRN: [id number]   CC: Headache, dizziness, anxiety  HPI:  Anxiety/ Depression Patient reporting that she has not felt herself for many months.  She states that she will be sitting at home and then start worrying about all the bad things that could happen to her.  She reports that she did not start the medication last time that she was here was discussed because she did not believe she has a problem but now she is desperate to find an answer.  Patient states that she has a good support system at home including her sister who she talks to frequently over the phone about her problems.  She does not have any thoughts of hurting herself or anyone else.  Patient reports that she will be laying in bed awake for hours thinking over and over again about the different things that could hurt her or her loved ones.  Reports that she often feels very fatigued even though she is unable to sleep. Patient with multiple emergency room visits for these concerns.  Follow-up blood pressure Patient reports that she previously has had high blood pressure readings in the outpatient setting however in the ED her blood pressures have been within normal limits.  Does report headaches on and off as well as dizziness.  Denies any vision changes, chest pain, shortness of breath, leg swelling.  Smoking status reviewed  ROS: 10 point ROS is otherwise negative, except as mentioned in HPI  Patient Active Problem List   Diagnosis Date Noted  . Dyspepsia 04/12/2018  . Insomnia 03/10/2018  . Anxiety with depression 03/10/2018  . Fatigue 02/13/2018  . Dizziness 01/11/2018  . Back pain, chronic      Objective:  BP 122/80   Pulse 75   Temp 98.4 F (36.9 C) (Oral)   Wt 181 lb 3.2 oz (82.2 kg)   LMP 10/02/2018   SpO2 100%   BMI 32.10 kg/m  Vitals and nursing note reviewed  General: NAD, pleasant Cardiac: RRR, normal heart sounds, no  murmurs Respiratory: CTAB, normal effort Extremities: no edema or cyanosis. WWP. Skin: warm and dry, no rashes noted Neuro: alert and oriented, no focal deficits Psych: normal affect, normal speech, normal judgment, normal thought content, no SI/HI, tearful  GAD 7 : Generalized Anxiety Score 10/24/2018 03/05/2018  Nervous, Anxious, on Edge 3 3  Control/stop worrying 3 3  Worry too much - different things 3 3  Trouble relaxing 3 2  Restless 0 3  Easily annoyed or irritable 3 0  Afraid - awful might happen 3 0  Total GAD 7 Score 18 14  Anxiety Difficulty Somewhat difficult Not difficult at all   Depression screen [facility] 2/9 10/24/2018 04/12/2018 02/13/2018  Decreased Interest 3 0 0  Down, Depressed, Hopeless 3 0 0  PHQ - 2 Score 6 0 0  Altered sleeping 3 - -  Tired, decreased energy 3 - -  Change in appetite 2 - -  Feeling bad or failure about yourself  0 - -  Trouble concentrating 3 - -  Moving slowly or fidgety/restless 0 - -  Suicidal thoughts 0 - -  PHQ-9 Score 17 - -  Difficult doing work/chores Extremely dIfficult - -    Assessment & Plan:    Anxiety with depression Also notes some aspects of depression with a PHQ 9 score of 17 in office today.  Gad 7 score today is  18.  Patient again denies any personal or family history of bipolar disorder or hospitalizations for psychiatry.  Patient previously interested in starting medication to help her anxiety and her wearing and never started.  However interested in starting today.  Patient also not interested in speaking with integrated care today at our visit as it is hard for her to talk to people. -Will start patient on Zoloft 25 mg as previously discussed.   -Patient to follow-up in 4 to 6 weeks.  Side effects discussed.  Insomnia Patient reports that she is having trouble falling asleep and staying asleep.  Likely related to her depression as well as anxiety.  Patient to start Zoloft 25 mg daily but we will also start patient on  melatonin over-the-counter in order to help fall asleep.  Blood pressure 122/80 today in office which is normal.  [place] Eric Morganti, [employer] Resident PGY-2

## 2018-10-27 NOTE — Assessment & Plan Note (Signed)
Patient reports that she is having trouble falling asleep and staying asleep.  Likely related to her depression as well as anxiety.  Patient to start Zoloft 25 mg daily but we will also start patient on melatonin over-the-counter in order to help fall asleep.

## 2018-10-27 NOTE — Assessment & Plan Note (Signed)
Also notes some aspects of depression with a PHQ 9 score of 17 in office today.  Gad 7 score today is 18.  Patient again denies any personal or family history of bipolar disorder or hospitalizations for psychiatry.  Patient previously interested in starting medication to help her anxiety and her wearing and never started.  However interested in starting today.  Patient also not interested in speaking with integrated care today at our visit as it is hard for her to talk to people. -Will start patient on Zoloft 25 mg as previously discussed.   -Patient to follow-up in 4 to 6 weeks.  Side effects discussed.

## 2018-11-11 ENCOUNTER — Encounter (HOSPITAL_COMMUNITY): Payer: Self-pay | Admitting: *Deleted

## 2018-11-11 ENCOUNTER — Inpatient Hospital Stay (HOSPITAL_COMMUNITY)
Admission: AD | Admit: 2018-11-11 | Discharge: 2018-11-11 | Disposition: A | Discharge status: Home / Self Care | Payer: BLUE CROSS/BLUE SHIELD | Attending: Obstetrics and Gynecology | Admitting: Obstetrics and Gynecology

## 2018-11-11 DIAGNOSIS — M549 Dorsalgia, unspecified: Secondary | ICD-10-CM | POA: Diagnosis present

## 2018-11-11 DIAGNOSIS — R102 Pelvic and perineal pain: Secondary | ICD-10-CM

## 2018-11-11 DIAGNOSIS — N912 Amenorrhea, unspecified: Secondary | ICD-10-CM | POA: Insufficient documentation

## 2018-11-11 LAB — URINALYSIS, ROUTINE W REFLEX MICROSCOPIC
Bilirubin Urine: NEGATIVE
GLUCOSE, UA: NEGATIVE mg/dL
HGB URINE DIPSTICK: NEGATIVE
KETONES UR: NEGATIVE mg/dL
Leukocytes, UA: NEGATIVE
Nitrite: NEGATIVE
PROTEIN: NEGATIVE mg/dL
Specific Gravity, Urine: 1.019 (ref 1.005–1.030)
pH: 8 (ref 5.0–8.0)

## 2018-11-11 LAB — WET PREP, GENITAL
Clue Cells Wet Prep HPF POC: NONE SEEN
Sperm: NONE SEEN
Trich, Wet Prep: NONE SEEN
Yeast Wet Prep HPF POC: NONE SEEN

## 2018-11-11 LAB — POCT PREGNANCY, URINE: Preg Test, Ur: NEGATIVE

## 2018-11-11 MED ORDER — NAPROXEN 500 MG PO TABS: 500.0000 mg | ORAL_TABLET | Freq: Two times a day (BID) | ORAL | 0 refills | Status: DC

## 2018-11-11 MED ORDER — KETOROLAC TROMETHAMINE 60 MG/2ML IM SOLN
60.0000 mg | Freq: Once | INTRAMUSCULAR | Status: AC
  Administered 2018-11-11: 60 mg via INTRAMUSCULAR
  Filled 2018-11-11: qty 2

## 2018-11-11 NOTE — MAU Note (Signed)
Jasmin Ellis is a 28 y.o.  here in MAU reporting:  +lower abdominal and back pain. Intermittent. Cramping in nature. LMP: 10/02/18. Endorse negative HPT 1 week ago. Onset of complaint: x 2 weeks Pain score: 6-7/10. Reports having tried tylenol with no relief. Vitals:   11/11/18 0837  BP: (!) 144/93  Pulse: 89  Resp: 16  Temp: 98.4 F (36.9 C)  SpO2: 99%   Lab orders placed from triage: ua and pregnancy test

## 2018-11-11 NOTE — MAU Provider Note (Signed)
History     CSN: [id number]  Arrival date and time: 11/11/18 1224   First Provider Initiated Contact with Patient 11/11/18 [phone]      Chief Complaint  Patient presents with  . Pelvic Pain  . Back Pain   Jasmin Ellis is a 28 y.o. [id number] who presents today with lower abdominal pain and back pain x 2 weeks. She states that she missed per most recent period, but had a negative pregnancy test at home. She also had a negative pregnancy test at [facility] on 10/21/18.   Pelvic Pain  The patient's primary symptoms include pelvic pain. The patient's pertinent negatives include no vaginal discharge. This is a new problem. Episode onset: 2 weeks ago. The problem occurs intermittently. The problem has been unchanged. Pain severity now: 7/10. The problem affects both sides. She is not pregnant. Associated symptoms include back pain. Pertinent negatives include no chills, dysuria, fever, frequency, nausea or vomiting. The vaginal discharge was normal. There has been no bleeding. Nothing aggravates the symptoms. She has tried NSAIDs and acetaminophen for the symptoms. The treatment provided no relief. She is sexually active. She uses nothing for contraception.  Back Pain  This is a new problem. Episode onset: 2 weeks  The problem occurs intermittently. The problem is unchanged. The pain is present in the sacro-iliac. The pain is at a severity of 7/10. Associated symptoms include pelvic pain. Pertinent negatives include no dysuria or fever. She has tried NSAIDs for the symptoms. The treatment provided no relief.    OB History    Gravida  3   Para  3   Term  3   Preterm  0   AB  0   Living  3     SAB  0   TAB  0   Ectopic  0   Multiple      Live Births  3           Past Medical History:  Diagnosis Date  . Anemia   . Anxiety   . Back pain, chronic    s/p fall 8 years ago  . Diabetes mellitus without complication ([place])   . Fibroids     Past Surgical History:  Procedure  Laterality Date  . NO PAST SURGERIES      Family History  Problem Relation Age of Onset  . Diabetes Father     Social History   Tobacco Use  . Smoking status: Never Smoker  . Smokeless tobacco: Never Used  Substance Use Topics  . Alcohol use: No  . Drug use: No    Allergies: No Known Allergies  Medications Prior to Admission  Medication Sig Dispense Refill Last Dose  . hydrOXYzine (ATARAX/VISTARIL) 10 MG tablet Take 1 tablet (10 mg total) by mouth 3 (three) times daily as needed for anxiety. 30 tablet 0   . ibuprofen (ADVIL,MOTRIN) 200 MG tablet Take 400 mg by mouth every 6 (six) hours as needed (pain).   3 days ago  . sertraline (ZOLOFT) 25 MG tablet Take 1 tablet (25 mg total) by mouth daily. 30 tablet 3     Review of Systems  Constitutional: Negative for chills and fever.  Gastrointestinal: Negative for nausea and vomiting.  Genitourinary: Positive for pelvic pain. Negative for dysuria, frequency, vaginal bleeding and vaginal discharge.  Musculoskeletal: Positive for back pain.   Physical Exam   Blood pressure (!) 144/93, pulse 89, temperature 98.4 F (36.9 C), temperature source Oral, resp. rate 16, weight 83 kg, last  menstrual period 10/02/2018, SpO2 99 %.  Physical Exam  Nursing note and vitals reviewed. Constitutional: She is oriented to person, place, and time. She appears well-developed and well-nourished. No distress.  HENT:  Head: Normocephalic.  Cardiovascular: Normal rate.  Respiratory: Effort normal.  GI: Soft. There is no abdominal tenderness. There is no rebound.  Genitourinary:    Genitourinary Comments:  External: no lesion Vagina: small amount of white discharge Cervix: pink, smooth, no CMT Uterus: NSSC Adnexa: NT    Neurological: She is alert and oriented to person, place, and time.  Skin: Skin is warm and dry.  Psychiatric: She has a normal mood and affect.   Results for orders placed or performed during the hospital encounter of  11/11/18 (from the past 24 hour(s))  Urinalysis, Routine w reflex microscopic     Status: None   Collection Time: 11/11/18  8:37 AM  Result Value Ref Range   Color, Urine YELLOW YELLOW   APPearance CLEAR CLEAR   Specific Gravity, Urine 1.019 1.005 - 1.030   pH 8.0 5.0 - 8.0   Glucose, UA NEGATIVE NEGATIVE mg/dL   Hgb urine dipstick NEGATIVE NEGATIVE   Bilirubin Urine NEGATIVE NEGATIVE   Ketones, ur NEGATIVE NEGATIVE mg/dL   Protein, ur NEGATIVE NEGATIVE mg/dL   Nitrite NEGATIVE NEGATIVE   Leukocytes, UA NEGATIVE NEGATIVE  Wet prep, genital     Status: Abnormal   Collection Time: 11/11/18  8:56 AM  Result Value Ref Range   Yeast Wet Prep HPF POC NONE SEEN NONE SEEN   Trich, Wet Prep NONE SEEN NONE SEEN   Clue Cells Wet Prep HPF POC NONE SEEN NONE SEEN   WBC, Wet Prep HPF POC FEW (A) NONE SEEN   Sperm NONE SEEN     MAU Course  Procedures  MDM Patient has had Toradol and reports that the pain has improved.  Assessment and Plan   1. Pelvic pain   2. Amenorrhea    DC home Comfort measures reviewed  RX: Naproxen 500 mg BID PRN  Return to MAU as needed FU with PCP Outpatient [place] ordered   Follow-up Information    Shirley, [place], DO Follow up.   Specialty:  Family Medicine Contact information: [address] [place] [address] [facility] DNP, CNM  11/11/18  9:10 AM

## 2018-11-11 NOTE — Discharge Instructions (Signed)
Pelvic Pain, Female  Pelvic pain is pain in your lower belly (abdomen), below your belly button and between your hips. The pain may start suddenly (be acute), keep coming back (be recurring), or last a long time (become chronic). Pelvic pain that lasts longer than 6 months is called chronic pelvic pain. There are many causes of pelvic pain. Sometimes the cause of pelvic pain is not known.  Follow these instructions at home:    · Take over-the-counter and prescription medicines only as told by your doctor.  · Rest as told by your doctor.  · Do not have sex if it hurts.  · Keep a journal of your pelvic pain. Write down:  ? When the pain started.  ? Where the pain is located.  ? What seems to make the pain better or worse, such as food or your period (menstrual cycle).  ? Any symptoms you have along with the pain.  · Keep all follow-up visits as told by your doctor. This is important.  Contact a doctor if:  · Medicine does not help your pain.  · Your pain comes back.  · You have new symptoms.  · You have unusual discharge or bleeding from your vagina.  · You have a fever or chills.  · You are having trouble pooping (constipation).  · You have blood in your pee (urine) or poop (stool).  · Your pee smells bad.  · You feel weak or light-headed.  Get help right away if:  · You have sudden pain that is very bad.  · Your pain keeps getting worse.  · You have very bad pain and also have any of these symptoms:  ? A fever.  ? Feeling sick to your stomach (nausea).  ? Throwing up (vomiting).  ? Being very sweaty.  · You pass out (lose consciousness).  Summary  · Pelvic pain is pain in your lower belly (abdomen), below your belly button and between your hips.  · There are many possible causes of pelvic pain.  · Keep a journal of your pelvic pain.  This information is not intended to replace advice given to you by your health care provider. Make sure you discuss any questions you have with your health care provider.  Document  Released: 03/21/2008 Document Revised: 03/21/2018 Document Reviewed: 03/21/2018  Elsevier Interactive Patient Education © 2019 Elsevier Inc.

## 2018-11-12 LAB — GC/CHLAMYDIA PROBE AMP (~~LOC~~) NOT AT ARMC
Chlamydia: NEGATIVE
NEISSERIA GONORRHEA: NEGATIVE

## 2018-11-13 ENCOUNTER — Encounter (HOSPITAL_COMMUNITY): Payer: Self-pay | Admitting: Emergency Medicine

## 2018-11-13 ENCOUNTER — Other Ambulatory Visit: Payer: Self-pay

## 2018-11-13 ENCOUNTER — Inpatient Hospital Stay (HOSPITAL_COMMUNITY): Payer: BLUE CROSS/BLUE SHIELD

## 2018-11-13 ENCOUNTER — Inpatient Hospital Stay (HOSPITAL_COMMUNITY)
Admission: AD | Admit: 2018-11-13 | Discharge: 2018-11-13 | Disposition: A | Discharge status: Home / Self Care | Payer: BLUE CROSS/BLUE SHIELD | Attending: Obstetrics and Gynecology | Admitting: Obstetrics and Gynecology

## 2018-11-13 DIAGNOSIS — O039 Complete or unspecified spontaneous abortion without complication: Secondary | ICD-10-CM | POA: Insufficient documentation

## 2018-11-13 DIAGNOSIS — R109 Unspecified abdominal pain: Secondary | ICD-10-CM | POA: Diagnosis present

## 2018-11-13 DIAGNOSIS — O209 Hemorrhage in early pregnancy, unspecified: Secondary | ICD-10-CM

## 2018-11-13 DIAGNOSIS — O3680X0 Pregnancy with inconclusive fetal viability, not applicable or unspecified: Secondary | ICD-10-CM

## 2018-11-13 LAB — URINALYSIS, ROUTINE W REFLEX MICROSCOPIC
BILIRUBIN URINE: NEGATIVE
Bacteria, UA: NONE SEEN
GLUCOSE, UA: NEGATIVE mg/dL
KETONES UR: NEGATIVE mg/dL
NITRITE: NEGATIVE
Protein, ur: NEGATIVE mg/dL
Specific Gravity, Urine: 1.005 (ref 1.005–1.030)
pH: 7 (ref 5.0–8.0)

## 2018-11-13 LAB — POCT PREGNANCY, URINE: Preg Test, Ur: NEGATIVE

## 2018-11-13 LAB — HCG, QUANTITATIVE, PREGNANCY: HCG, BETA CHAIN, QUANT, S: 19 m[IU]/mL — AB (ref <5)

## 2018-11-13 MED ORDER — ACETAMINOPHEN 500 MG PO TABS
1000.0000 mg | ORAL_TABLET | Freq: Once | ORAL | Status: AC
  Administered 2018-11-13: 1000 mg via ORAL
  Filled 2018-11-13: qty 2

## 2018-11-13 MED ORDER — IBUPROFEN 600 MG PO TABS: 600.0000 mg | ORAL_TABLET | Freq: Four times a day (QID) | ORAL | 0 refills | Status: DC | PRN

## 2018-11-13 NOTE — MAU Note (Signed)
Pt was in MAU on Sunday with abd & back pain. UPT here was negative.  Has had several positive HPT's since then.  Started bleeding two hours ago.  Abdominal & back pain continues.

## 2018-11-13 NOTE — MAU Provider Note (Addendum)
History     CSN: [id number]  Arrival date and time: 11/13/18 1107   First Provider Initiated Contact with Patient 11/13/18 1309      Chief Complaint  Patient presents with  . Abdominal Pain  . Back Pain  . Vaginal Bleeding   HPI Jasmin Ellis is a 28 y.o. [id number] who presents to MAU with chief complaint of vaginal bleeding and abdominal cramping for the past two weeks. She endorses multiple positive home pregnancy tests yesterday and this morning. Her abdominal pain is 5/10, bilateral lower abdomen, radiates to her lower back. She denies aggravating or alleviating factors. She has not taken medication or tried other treatments for this problem. She denies abnormal vaginal discharge, fever, falls, or recent illness.    OB History    Gravida  3   Para  3   Term  3   Preterm  0   AB  0   Living  3     SAB  0   TAB  0   Ectopic  0   Multiple      Live Births  3           Past Medical History:  Diagnosis Date  . Anemia   . Anxiety   . Back pain, chronic    s/p fall 8 years ago  . Diabetes mellitus without complication ([place])   . Fibroids     Past Surgical History:  Procedure Laterality Date  . NO PAST SURGERIES      Family History  Problem Relation Age of Onset  . Diabetes Father     Social History   Tobacco Use  . Smoking status: Never Smoker  . Smokeless tobacco: Never Used  Substance Use Topics  . Alcohol use: No  . Drug use: No    Allergies: No Known Allergies  Medications Prior to Admission  Medication Sig Dispense Refill Last Dose  . hydrOXYzine (ATARAX/VISTARIL) 10 MG tablet Take 1 tablet (10 mg total) by mouth 3 (three) times daily as needed for anxiety. 30 tablet 0   . naproxen (NAPROSYN) 500 MG tablet Take 1 tablet (500 mg total) by mouth 2 (two) times daily. 30 tablet 0   . sertraline (ZOLOFT) 25 MG tablet Take 1 tablet (25 mg total) by mouth daily. 30 tablet 3     Review of Systems  Constitutional: Negative for chills,  fatigue and fever.  Respiratory: Negative for shortness of breath.   Gastrointestinal: Positive for abdominal pain. Negative for nausea and vomiting.  Genitourinary: Positive for vaginal bleeding. Negative for flank pain.  Musculoskeletal: Positive for back pain.  Neurological: Negative for dizziness, syncope, weakness and headaches.  All other systems reviewed and are negative.  Physical Exam   Blood pressure (!) 141/91, pulse 76, temperature 98 F (36.7 C), temperature source Oral, resp. rate 18, height 5\' 5"  (1.651 m), weight 83.5 kg, last menstrual period 10/02/2018.  Physical Exam  Nursing note and vitals reviewed. Constitutional: She is oriented to person, place, and time. She appears well-developed and well-nourished.  Cardiovascular: Normal rate.  Respiratory: Effort normal.  GI: Soft. She exhibits no distension. There is no abdominal tenderness. There is no rebound and no guarding.  Neurological: She is alert and oriented to person, place, and time.  Skin: Skin is warm and dry.  Psychiatric: She has a normal mood and affect. Her behavior is normal. Judgment and thought content normal.    MAU Course/MDM  Procedures   --Patient endorses passing clots when  she used restroom immediately after [place] --Bleeding scant throughout time in MAU  Patient Vitals for the past 24 hrs:  BP Temp Temp src Pulse Resp Height Weight  11/13/18 1557 134/60 - - 93 - - -  11/13/18 1141 (!) 141/91 98 F (36.7 C) Oral 76 18 5\' 5"  (1.651 m) 184 lb (83.5 kg)    Results for orders placed or performed during the hospital encounter of 11/13/18 (from the past 24 hour(s))  Urinalysis, Routine w reflex microscopic     Status: Abnormal   Collection Time: 11/13/18 11:50 AM  Result Value Ref Range   Color, Urine YELLOW YELLOW   APPearance CLEAR CLEAR   Specific Gravity, Urine 1.005 1.005 - 1.030   pH 7.0 5.0 - 8.0   Glucose, UA NEGATIVE NEGATIVE mg/dL   Hgb urine dipstick LARGE (A) NEGATIVE   Bilirubin  Urine NEGATIVE NEGATIVE   Ketones, ur NEGATIVE NEGATIVE mg/dL   Protein, ur NEGATIVE NEGATIVE mg/dL   Nitrite NEGATIVE NEGATIVE   Leukocytes, UA TRACE (A) NEGATIVE   RBC / HPF 21-50 0 - 5 RBC/hpf   WBC, UA 6-10 0 - 5 WBC/hpf   Bacteria, UA NONE SEEN NONE SEEN   Squamous Epithelial / LPF 0-5 0 - 5   Mucus PRESENT   Pregnancy, urine POC     Status: None   Collection Time: 11/13/18 11:55 AM  Result Value Ref Range   Preg Test, Ur NEGATIVE NEGATIVE  hCG, quantitative, pregnancy     Status: Abnormal   Collection Time: 11/13/18 12:15 PM  Result Value Ref Range   hCG, Beta Chain, Quant, S 19 (H) <5 mIU/mL    US Ob Less Than 14 Weeks With Ob Transvaginal  Result Date: 11/13/2018 CLINICAL DATA:  Vaginal bleeding in first trimester of pregnancy, pregnancy of unknown anatomic location; EGA of [redacted] weeks 0 days based on LMP of 10/02/2018; patient actively bleeding EXAM: OBSTETRIC <14 WK [place] AND TRANSVAGINAL OB US TECHNIQUE: Both transabdominal and transvaginal ultrasound examinations were performed for complete evaluation of the gestation as well as the maternal uterus, adnexal regions, and pelvic cul-de-sac. Transvaginal technique was performed to assess early pregnancy. COMPARISON:  None for this gestation FINDINGS: Intrauterine gestational sac: Not identified Yolk sac:  N/A Embryo:  N/A Cardiac Activity: N/A Heart Rate: N/A  bpm MSD:   mm    w     d CRL:    mm    w    d                  [place] EDC: Subchorionic hemorrhage:  N/A Maternal uterus/adnexae: No intrauterine gestational sac or fluid collection identified. Endometrial complex unremarkable. Calcified uterine leiomyoma upper LEFT uterus 1.4 x 1.6 cm with posterior shadowing. At LEFT ovary normal size and morphology 3.5 x 1.4 x 2.0 cm. RIGHT ovary normal size and morphology 4.6 x 2.3 x 2.5 cm. Small amount of free pelvic fluid. Bowel loops in adnexa with echogenic shadowing bowel gas noted. Small echogenic focus in RIGHT adnexa near RIGHT ovary 14 x 11  mm, uncertain if represents an artifact, a small amount of blood or an adnexal mass; this is seen in the sagittal plane but not adequately demonstrated in transverse plane. IMPRESSION: No intrauterine gestation identified consistent with pregnancy of unknown anatomic location. Questionable 1.4 x 1.1 cm diameter nodule versus artifact is seen in the RIGHT adnexa near the RIGHT ovary, but seen only in sagittal plane and not reproducible in transverse plane, of uncertain  significance. Due to lack of identified intrauterine gestation, differential diagnosis includes early intrauterine pregnancy too early to visualize, spontaneous abortion, and ectopic pregnancy. Serial quantitative beta HCG and or follow-up ultrasound recommended to definitively exclude ectopic pregnancy. Electronically Signed   By: Lavonia Dana M.D.   On: 11/13/2018 15:45   Assessment and Plan  --28 y.o. [id number] complete miscarriage vs pregnancy of unknown viability --Hemodynamically stable, scant bleeding  --Rx Motrin for residual cramping --Discharge home in stable condition, return to MAU for worsening symptoms  F/U: Patient to have repeat quant 11/15/2018 at 2pm at [facility].   Darlina Rumpf, CNM 11/13/2018, 4:42 PM

## 2018-11-13 NOTE — Discharge Instructions (Signed)
Miscarriage  A miscarriage is the loss of an unborn baby (fetus) before the 20th week of pregnancy.  Follow these instructions at home:  Medicines    · Take over-the-counter and prescription medicines only as told by your doctor.  · If you were prescribed antibiotic medicine, take it as told by your doctor. Do not stop taking the antibiotic even if you start to feel better.  · Do not take NSAIDs unless your doctor says that this is safe for you. NSAIDs include aspirin and ibuprofen. These medicines can cause bleeding.  Activity  · Rest as directed. Ask your doctor what activities are safe for you.  · Have someone help you at home during this time.  General instructions  · Write down how many pads you use each day and how soaked they are.  · Watch the amount of tissue or clumps of blood (blood clots) that you pass from your vagina. Save any large amounts of tissue for your doctor.  · Do not use tampons, douche, or have sex until your doctor approves.  · To help you and your partner with the process of grieving, talk with your doctor or seek counseling.  · When you are ready, meet with your doctor to talk about steps you should take for your health. Also, talk with your doctor about steps to take to have a healthy pregnancy in the future.  · Keep all follow-up visits as told by your doctor. This is important.  Contact a doctor if:  · You have a fever or chills.  · You have vaginal discharge that smells bad.  · You have more bleeding.  Get help right away if:  · You have very bad cramps or pain in your back or belly.  · You pass clumps of blood that are walnut-sized or larger from your vagina.  · You pass tissue that is walnut-sized or larger from your vagina.  · You soak more than 1 regular pad in an hour.  · You get light-headed or weak.  · You faint (pass out).  · You have feelings of sadness that do not go away, or you have thoughts of hurting yourself.  Summary  · A miscarriage is the loss of an unborn baby before  the 20th week of pregnancy.  · Follow your doctor's instructions for home care. Keep all follow-up appointments.  · To help you and your partner with the process of grieving, talk with your doctor or seek counseling.  This information is not intended to replace advice given to you by your health care provider. Make sure you discuss any questions you have with your health care provider.  Document Released: 12/26/2011 Document Revised: 11/08/2016 Document Reviewed: 11/08/2016  Elsevier Interactive Patient Education © 2019 Elsevier Inc.

## 2018-11-14 ENCOUNTER — Ambulatory Visit: Payer: BLUE CROSS/BLUE SHIELD | Admitting: Family Medicine

## 2018-11-15 ENCOUNTER — Telehealth: Payer: Self-pay | Admitting: *Deleted

## 2018-11-15 ENCOUNTER — Encounter: Payer: Self-pay | Admitting: *Deleted

## 2018-11-15 ENCOUNTER — Ambulatory Visit: Payer: BLUE CROSS/BLUE SHIELD

## 2018-11-15 NOTE — Telephone Encounter (Signed)
Jasmin Ellis [facility] stat bhcg appointment today. I called her mobile number and left a message notifying her she missed important lab appointment in our office and we recommend she call us to reschedule. I called her home number and heard a message she has a voicemail box that has not been set up. Will send letter.

## 2018-12-04 ENCOUNTER — Other Ambulatory Visit (HOSPITAL_COMMUNITY): Payer: Self-pay | Admitting: Advanced Practice Midwife

## 2018-12-04 ENCOUNTER — Ambulatory Visit (HOSPITAL_COMMUNITY)
Admission: RE | Admit: 2018-12-04 | Discharge: 2018-12-04 | Disposition: A | Discharge status: Home / Self Care | Payer: BLUE CROSS/BLUE SHIELD | Source: Ambulatory Visit | Attending: Advanced Practice Midwife | Admitting: Advanced Practice Midwife

## 2018-12-04 ENCOUNTER — Ambulatory Visit (INDEPENDENT_AMBULATORY_CARE_PROVIDER_SITE_OTHER): Payer: BLUE CROSS/BLUE SHIELD | Admitting: General Practice

## 2018-12-04 DIAGNOSIS — N912 Amenorrhea, unspecified: Secondary | ICD-10-CM

## 2018-12-04 DIAGNOSIS — R102 Pelvic and perineal pain: Secondary | ICD-10-CM

## 2018-12-04 DIAGNOSIS — O3680X0 Pregnancy with inconclusive fetal viability, not applicable or unspecified: Secondary | ICD-10-CM

## 2018-12-04 DIAGNOSIS — O209 Hemorrhage in early pregnancy, unspecified: Secondary | ICD-10-CM

## 2018-12-04 NOTE — Progress Notes (Signed)
Patient presents to office today for u/s results & bhcg. Discussed with Dr Hulan Fray who states patient only needs routine bhcg, not stat.  Discussed with patient & asked about bleeding. Patient states she stopped bleeding about a week after coming to MAU and has not had pain/bleeding since then. Patient reports passing weird and different looking clots several weeks ago. Told patient we will contact her with results. Patient verbalized understanding & had no questions.  Koren Bound RN BSN 12/04/18

## 2018-12-05 LAB — BETA HCG QUANT (REF LAB): hCG Quant: <1 m[IU]/mL

## 2018-12-07 NOTE — Progress Notes (Signed)
I have reviewed this chart and agree with the RN/CMA assessment and management.    Zaki Gertsch C Madgie Dhaliwal, MD, FACOG Attending Physician, [facility]  

## 2018-12-24 ENCOUNTER — Other Ambulatory Visit: Payer: Self-pay

## 2018-12-24 ENCOUNTER — Telehealth: Payer: Self-pay | Admitting: Family Medicine

## 2018-12-24 ENCOUNTER — Encounter: Payer: Self-pay | Admitting: Family Medicine

## 2018-12-24 MED ORDER — HYDROXYZINE HCL 10 MG PO TABS: 10.0000 mg | ORAL_TABLET | Freq: Three times a day (TID) | ORAL | 1 refills | Status: DC | PRN

## 2018-12-24 NOTE — Telephone Encounter (Signed)
Pt is calling and would like for Dr. Enid Derry to call her to discuss her medications. Pt was sent to the nurse line but would also like to speak with Dr. Enid Derry.

## 2018-12-24 NOTE — Telephone Encounter (Signed)
Will forward to MD. Meesha Sek,CMA  

## 2018-12-26 NOTE — Telephone Encounter (Signed)
Patient calling requesting refill of Atarax reports that this is really helping her with her anxiety.  Patient also reports that she has tried the Zoloft but did not like the side effects of this.  Patient encouraged to schedule an appointment in order to discuss this further.  A refill of the hydroxyzine was provided on 12/24/2018 and I told her to check with her pharmacy.  All questions answered.

## 2019-01-23 ENCOUNTER — Encounter: Payer: Self-pay | Admitting: *Deleted

## 2019-01-29 ENCOUNTER — Other Ambulatory Visit: Payer: Self-pay

## 2019-01-29 ENCOUNTER — Telehealth (INDEPENDENT_AMBULATORY_CARE_PROVIDER_SITE_OTHER): Payer: BLUE CROSS/BLUE SHIELD | Admitting: Family Medicine

## 2019-01-29 DIAGNOSIS — K146 Glossodynia: Secondary | ICD-10-CM | POA: Insufficient documentation

## 2019-01-29 NOTE — Progress Notes (Signed)
[place] Telemedicine Visit  Patient consented to have virtual visit. Method of visit: Video was attempted, but technology challenges prevented patient from using video, so visit was conducted via telephone.  Encounter participants: Patient: Jasmin Ellis - located at home Provider: Bufford Lope - located at [facility] Others (if applicable): none  Chief Complaint: tongue burning  HPI: Patient states that she has had tongue burning for the past 1 month.  It started spontaneously and has persisted but not worsened.  She states it feels superficial on her tongue like she eats something spicy but she has not in a while.  She has not had any rashes, ulcers, color changes, swelling of her tongue, lips, or mouth. She has had no trouble eating, breathing.  No dry mouth.  No new environmental or food exposures  ROS: per HPI  Pertinent PMHx: Anxiety on Zoloft  Exam:  Respiratory: Normal work of breathing HEENT: Based on poor video quality tongue appeared to be pink, midline.  No significant lesions appreciated.  Assessment/Plan:  Tongue burning sensation Uncertain etiology.  However given that patient has been dealing with mild superficial symptoms for the past 1 month likely not a emergent issue.  She denied any ulcers or lesions so unlikely to be a oral mucosal disease.  She does have some anxiety which could be a contributing factor has differential includes burning tongue syndrome.  Unlikely to be contact dermatitis as she has had no environmental exposures.  Offered laboratory testing to evaluate for nutritional deficiencies such as vitamin B12.  Unlikely to be diabetes related as had recent blood work without significantly elevated glucose.  Patient declined and opted for watchful waiting at this time.  She will call back in a couple of weeks if this continues to be an issue at which time I would recommend checking vitamin B12, TSH, evaluation of tongue in person.     Time spent during visit with patient: 7 minutes  Billing: yes  Bufford Lope, DO PGY-3, [place] [facility] 01/29/2019 10:17 AM

## 2019-01-29 NOTE — Assessment & Plan Note (Signed)
Uncertain etiology.  However given that patient has been dealing with mild superficial symptoms for the past 1 month likely not a emergent issue.  She denied any ulcers or lesions so unlikely to be a oral mucosal disease.  She does have some anxiety which could be a contributing factor has differential includes burning tongue syndrome.  Unlikely to be contact dermatitis as she has had no environmental exposures.  Offered laboratory testing to evaluate for nutritional deficiencies such as vitamin B12.  Unlikely to be diabetes related as had recent blood work without significantly elevated glucose.  Patient declined and opted for watchful waiting at this time.  She will call back in a couple of weeks if this continues to be an issue at which time I would recommend checking vitamin B12, TSH, evaluation of tongue in person.

## 2019-02-17 ENCOUNTER — Emergency Department (HOSPITAL_COMMUNITY)
Admission: EM | Admit: 2019-02-17 | Discharge: 2019-02-17 | Disposition: A | Discharge status: Home / Self Care | Payer: BLUE CROSS/BLUE SHIELD | Attending: Emergency Medicine | Admitting: Emergency Medicine

## 2019-02-17 ENCOUNTER — Other Ambulatory Visit: Payer: Self-pay

## 2019-02-17 ENCOUNTER — Encounter (HOSPITAL_COMMUNITY): Payer: Self-pay | Admitting: Emergency Medicine

## 2019-02-17 DIAGNOSIS — E119 Type 2 diabetes mellitus without complications: Secondary | ICD-10-CM | POA: Insufficient documentation

## 2019-02-17 DIAGNOSIS — Z79899 Other long term (current) drug therapy: Secondary | ICD-10-CM | POA: Insufficient documentation

## 2019-02-17 DIAGNOSIS — R52 Pain, unspecified: Secondary | ICD-10-CM | POA: Diagnosis not present

## 2019-02-17 DIAGNOSIS — R6883 Chills (without fever): Secondary | ICD-10-CM | POA: Insufficient documentation

## 2019-02-17 LAB — URINALYSIS, ROUTINE W REFLEX MICROSCOPIC
Bacteria, UA: NONE SEEN
Bilirubin Urine: NEGATIVE
Glucose, UA: 150 mg/dL — AB
Hgb urine dipstick: NEGATIVE
Ketones, ur: NEGATIVE mg/dL
Nitrite: NEGATIVE
Protein, ur: NEGATIVE mg/dL
Specific Gravity, Urine: 1.013 (ref 1.005–1.030)
pH: 7 (ref 5.0–8.0)

## 2019-02-17 LAB — CBG MONITORING, ED: Glucose-Capillary: 186 mg/dL — ABNORMAL HIGH (ref 70–99)

## 2019-02-17 LAB — POC URINE PREG, ED: Preg Test, Ur: NEGATIVE

## 2019-02-17 NOTE — ED Triage Notes (Signed)
C/o generalized body aches and feeling sick since Friday.  States she fasted for 1 week due to Ramadan.  Missed last period but took a pregnancy test and it was negative. States she has diabetes but was told she didn't need medication because labs were better.  Concerned that her blood sugar may be low.

## 2019-02-17 NOTE — ED Notes (Signed)
Pt complains of generalized bodyaches x 3 days and general malaise. Denies cough, shob, CP, abd pain, n/v or urinary sx. Pt just finished fasting from 4am-7pm every day for a week.

## 2019-02-17 NOTE — Discharge Instructions (Signed)
Your symptoms are likely consistent with a viral illness. Viruses do not require or respond to antibiotics. Treatment is symptomatic care and it is important to note that these symptoms may last for 7-14 days.   Patients who have symptoms consistent with COVID-19 should self isolated for: At least 3 days (72 hours) have passed since recovery, defined as resolution of fever without the use of fever reducing medications and improvement in respiratory symptoms (e.g., cough, shortness of breath), and At least 7 days have passed since symptoms first appeared.  Hand washing: Wash your hands throughout the day, but especially before and after touching the face, using the restroom, sneezing, coughing, or touching surfaces that have been coughed or sneezed upon. Hydration: Symptoms of most illnesses will be intensified and complicated by dehydration. Dehydration can also extend the duration of symptoms. Drink plenty of fluids and get plenty of rest. You should be drinking at least half a liter of water an hour to stay hydrated. Electrolyte drinks (ex. Gatorade, Powerade, Pedialyte) are also encouraged. You should be drinking enough fluids to make your urine light yellow, almost clear. If this is not the case, you are not drinking enough water. Please note that some of the treatments indicated below will not be effective if you are not adequately hydrated. Pain or fever: Ibuprofen, Naproxen, or acetaminophen (generic for Tylenol) for pain or fever.  Antiinflammatory medications: Take 600 mg of ibuprofen every 6 hours or 440 mg (over the counter dose) to 500 mg (prescription dose) of naproxen every 12 hours for the next 3 days. After this time, these medications may be used as needed for pain. Take these medications with food to avoid upset stomach. Choose only one of these medications, do not take them together. Acetaminophen (generic for Tylenol): Should you continue to have additional pain while taking the  ibuprofen or naproxen, you may add in acetaminophen as needed. Your daily total maximum amount of acetaminophen from all sources should be limited to 4000mg /day for persons without liver problems, or 2000mg /day for those with liver problems. Zyrtec or Claritin: May add these medication daily to control underlying symptoms of congestion, sneezing, and other signs of allergies.  These medications are available over-the-counter. Generics: Cetirizine (generic for Zyrtec) and loratadine (generic for Claritin). Fluticasone: Use fluticasone (generic for Flonase), as directed, for nasal and sinus congestion.  This medication is available over-the-counter. Congestion: Plain guaifenesin (generic for plain Mucinex) may help relieve congestion. Saline sinus rinses and saline nasal sprays may also help relieve congestion. If you do not have high blood pressure, heart problems, or an allergy to such medications, you may also try phenylephrine or Sudafed. Sore throat: Warm liquids or Chloraseptic spray may help soothe a sore throat. Gargle twice a day with a salt water solution made from a half teaspoon of salt in a cup of warm water.  Follow up: Follow up with a primary care provider within the next two weeks should symptoms fail to resolve. Return: Return to the ED for significantly worsening symptoms, shortness of breath, persistent vomiting, large amounts of blood in stool, or any other major concerns.  For prescription assistance, may try using prescription discount sites or apps, such as goodrx.com  Your blood sugar was higher than normal today.  Please follow-up with your primary care provider on this matter.

## 2019-02-17 NOTE — ED Notes (Signed)
Discharge instructions discussed with Pt. Pt verbalized understanding. Pt stable and ambulatory.    

## 2019-02-17 NOTE — ED Provider Notes (Signed)
[facility] Provider Note   CSN: [id number] Arrival date & time: 02/17/19  1659    History   Chief Complaint Chief Complaint  Patient presents with  . Generalized Body Aches    HPI Jasmin Ellis is a 28 y.o. female.     HPI   Jasmin Ellis is a 28 y.o. female, with a history of anemia, anxiety, DM, presenting to the ED with body aches and chills for the last 3 days.  Also endorses rhinorrhea.  Denies fever, cough, chest pain, abdominal pain, shortness of breath, urinary symptoms, dizziness, syncope, N/V/D, or any other complaints.   Past Medical History:  Diagnosis Date  . Anemia   . Anxiety   . Back pain, chronic    s/p fall 8 years ago  . Diabetes mellitus without complication ([place])   . Fibroids     Patient Active Problem List   Diagnosis Date Noted  . Tongue burning sensation 01/29/2019  . Dyspepsia 04/12/2018  . Insomnia 03/10/2018  . Anxiety with depression 03/10/2018  . Fatigue 02/13/2018  . Dizziness 01/11/2018  . Diabetes mellitus in pregnancy 08/05/2014  . Back pain, chronic     Past Surgical History:  Procedure Laterality Date  . NO PAST SURGERIES       OB History    Gravida  3   Para  3   Term  3   Preterm  0   AB  0   Living  3     SAB  0   TAB  0   Ectopic  0   Multiple      Live Births  3            Home Medications    Prior to Admission medications   Medication Sig Start Date End Date Taking? Authorizing Provider  hydrOXYzine (ATARAX/VISTARIL) 10 MG tablet Take 1 tablet (10 mg total) by mouth 3 (three) times daily as needed for anxiety. 12/24/18   Shirley, [place], DO  ibuprofen (ADVIL,MOTRIN) 600 MG tablet Take 1 tablet (600 mg total) by mouth every 6 (six) hours as needed. 11/13/18   Darlina Rumpf, CNM  naproxen (NAPROSYN) 500 MG tablet Take 1 tablet (500 mg total) by mouth 2 (two) times daily. 11/11/18   Tresea Mall, CNM  sertraline (ZOLOFT) 25 MG tablet Take  1 tablet (25 mg total) by mouth daily. 10/24/18   Shirley, [place], DO    Family History Family History  Problem Relation Age of Onset  . Diabetes Father     Social History Social History   Tobacco Use  . Smoking status: Never Smoker  . Smokeless tobacco: Never Used  Substance Use Topics  . Alcohol use: No  . Drug use: No     Allergies   Patient has no known allergies.   Review of Systems Review of Systems  Constitutional: Positive for chills. Negative for diaphoresis and fever.  HENT: Negative for congestion, ear pain, sore throat and trouble swallowing.   Respiratory: Negative for cough and shortness of breath.   Cardiovascular: Negative for chest pain.  Gastrointestinal: Negative for abdominal pain, diarrhea, nausea and vomiting.  Genitourinary: Negative for dysuria, flank pain, frequency, hematuria, vaginal bleeding and vaginal discharge.  Musculoskeletal: Positive for myalgias. Negative for back pain, neck pain and neck stiffness.  Skin: Negative for rash.  Neurological: Negative for dizziness, syncope, weakness, light-headedness, numbness and headaches.  All other systems reviewed and are negative.    Physical Exam Updated  Vital Signs BP (!) 141/83   Pulse 79   Temp 98.9 F (37.2 C) (Oral)   Resp 17   Ht 5\' 3"  (1.6 m)   Wt 81.6 kg   SpO2 100%   BMI 31.89 kg/m   Physical Exam Vitals signs and nursing note reviewed.  Constitutional:      General: She is not in acute distress.    Appearance: She is well-developed. She is not diaphoretic.  HENT:     Head: Normocephalic and atraumatic.     Right Ear: Tympanic membrane, ear canal and external ear normal.     Left Ear: Tympanic membrane, ear canal and external ear normal.     Nose: Mucosal edema and rhinorrhea present.     Right Sinus: No maxillary sinus tenderness or frontal sinus tenderness.     Left Sinus: No maxillary sinus tenderness or frontal sinus tenderness.     Mouth/Throat:     Mouth: Mucous  membranes are moist.     Pharynx: Oropharynx is clear.  Eyes:     Conjunctiva/sclera: Conjunctivae normal.  Neck:     Musculoskeletal: Normal range of motion and neck supple. No neck rigidity.  Cardiovascular:     Rate and Rhythm: Normal rate and regular rhythm.     Pulses: Normal pulses.     Heart sounds: Normal heart sounds.     Comments: Tactile temperature in the extremities appropriate and equal bilaterally. Pulmonary:     Effort: Pulmonary effort is normal. No respiratory distress.     Breath sounds: Normal breath sounds.  Abdominal:     Palpations: Abdomen is soft.     Tenderness: There is no abdominal tenderness. There is no guarding.  Musculoskeletal:     Right lower leg: No edema.     Left lower leg: No edema.  Lymphadenopathy:     Cervical: No cervical adenopathy.  Skin:    General: Skin is warm and dry.  Neurological:     Mental Status: She is alert.  Psychiatric:        Mood and Affect: Mood and affect normal.        Speech: Speech normal.        Behavior: Behavior normal.      ED Treatments / Results  Labs (all labs ordered are listed, but only abnormal results are displayed) Labs Reviewed  URINALYSIS, ROUTINE W REFLEX MICROSCOPIC - Abnormal; Notable for the following components:      Result Value   APPearance HAZY (*)    Glucose, UA 150 (*)    Leukocytes,Ua TRACE (*)    All other components within normal limits  CBG MONITORING, ED - Abnormal; Notable for the following components:   Glucose-Capillary 186 (*)    All other components within normal limits  POC URINE PREG, ED    EKG None  Radiology No results found.  Procedures Procedures (including critical care time)  Medications Ordered in ED Medications - No data to display   Initial Impression / Assessment and Plan / ED Course  I have reviewed the triage vital signs and the nursing notes.  Pertinent labs & imaging results that were available during my care of the patient were reviewed  by me and considered in my medical decision making (see chart for details).        Patient presents with complaint of body aches, chills, and rhinorrhea.  Suspicion for viral origin of symptoms. Patient is nontoxic appearing, afebrile, not tachycardic, not tachypneic, not hypotensive, maintains excellent  SPO2 on room air, and is in no apparent distress.  She was mildly hyperglycemic here today.  She will follow-up with her PCP on this matter.  Hydration and dietary adjustments discussed. The patient was given instructions for home care as well as return precautions. Patient voices understanding of these instructions, accepts the plan, and is comfortable with discharge.    Final Clinical Impressions(s) / ED Diagnoses   Final diagnoses:  Body aches  Chills    ED Discharge Orders    None       Layla Maw 02/18/19 1623    Sherwood Gambler, MD 02/21/19 1555

## 2019-02-20 ENCOUNTER — Other Ambulatory Visit: Payer: Self-pay

## 2019-02-20 ENCOUNTER — Ambulatory Visit (INDEPENDENT_AMBULATORY_CARE_PROVIDER_SITE_OTHER): Payer: BLUE CROSS/BLUE SHIELD | Admitting: Family Medicine

## 2019-02-20 VITALS — BP 111/80 | HR 87 | Temp 98.5°F | Wt 182.0 lb

## 2019-02-20 DIAGNOSIS — F418 Other specified anxiety disorders: Secondary | ICD-10-CM | POA: Diagnosis not present

## 2019-02-20 DIAGNOSIS — R7309 Other abnormal glucose: Secondary | ICD-10-CM | POA: Diagnosis not present

## 2019-02-20 LAB — POCT GLYCOSYLATED HEMOGLOBIN (HGB A1C): Hemoglobin A1C: 5.7 % — AB (ref 4.0–5.6)

## 2019-02-20 MED ORDER — SERTRALINE HCL 25 MG PO TABS: 25.0000 mg | ORAL_TABLET | Freq: Every day | ORAL | 0 refills | Status: DC

## 2019-02-20 NOTE — Patient Instructions (Signed)
It was great to see you!  Our plans for today:  - Take the sertraline every day. You will likely not feel a difference for a few weeks. It takes up to a month for it to reach its full effect. Let us know if you do not tolerate this medication. Make an appointment in a few weeks to a month to follow up. - Walk for 10-15 minutes after lunch and dinner to lower blood sugars. - Try to incorporate more vegetables in your diet. - Please call our nutritionist, Dr. Jenne Campus, to set up an appointment. [phone].  Take care and seek immediate care sooner if you develop any concerns.   Dr. [facility]   Diet Recommendations for Diabetes   1. Eat at least 3 meals and 1-2 snacks per day. Never go more than 4-5 hours while awake without eating. Eat breakfast within the first hour of getting up.   2. Limit starchy foods to TWO per meal and ONE per snack. ONE portion of a starchy  food is equal to the following:   - ONE slice of bread (or its equivalent, such as half of a hamburger bun).   - 1/2 cup of a "scoopable" starchy food such as potatoes or rice.   - 15 grams of Total Carbohydrate as shown on food label.  3. Include at every meal: a protein food, a carb food, and vegetables and/or fruit.   - Obtain twice the volume of vegetables as protein or carbohydrate foods for both lunch and dinner.   - Fresh or frozen vegetables are best.   - Keep frozen vegetables on hand for a quick vegetable serving.       Starchy (carb) foods: Bread, rice, pasta, potatoes, corn, cereal, grits, crackers, bagels, muffins, all baked goods.  (Fruits, milk, and yogurt also have carbohydrate, but most of these foods will not spike your blood sugar as most starchy foods will.)  A few fruits do cause high blood sugars; use small portions of bananas (limit to 1/2 at a time), grapes, watermelon, oranges, and most tropical fruits.    Protein foods: Meat, fish, poultry, eggs, dairy foods, and beans such as pinto  and kidney beans (beans also provide carbohydrate).      Here is an example of what a healthy plate looks like:    ? Make half your plate fruits and vegetables.     ? Focus on whole fruits.     ? Vary your veggies.  ? Make half your grains whole grains. -     ? Look for the word "whole" at the beginning of the ingredients list    ? Some whole-grain ingredients include whole oats, whole-wheat flour,        whole-grain corn, whole-grain brown rice, and whole rye.  ? Move to low-fat and fat-free milk or yogurt.  ? Vary your protein routine. - Meat, fish, poultry (chicken, [place]), eggs, beans (kidney, pinto), dairy.  ? Drink and eat less sodium, saturated fat, and added sugars.

## 2019-02-20 NOTE — Progress Notes (Signed)
Subjective:   Patient ID: Jasmin Ellis    DOB: [birth year], 28 y.o. female   MRN: [id number]  Jasmin Ellis is a 28 y.o. female with a history of anxiety, gestational diabetes here for   High blood sugar - seen in ED 5/3 for body aches, chills, and rhinorrhea with mild hyperglycemia (CBG 186, glucose 125 on UA). Now better. Still endorsing "something not right in her head," associates this with her panic attacks. - previously with gestational diabetes, the last 2 babies was on medication, father with DM - has been on metformin before while not pregnant, well tolerated previously. - last time she felt "not right," she was told she was diabetic. - has previously had polydipsia and polyuria, but not recently. - bought a glucometer yesterday and checked fasting CBG at home this morning, was 126. Did not eat dinner last night. Yesterday before lunch, her CBG was 164. - diet: eats African food (rice, fufu), doesn't like vegetables or fruits (maybe will eat avocado or banana) - interested in nutrition appt - doesn't exercise (used to jog in room or dance, walk, stairs) but states she hasn't recently b/c she has "been lazy."  Anxiety - Medications: atarax, sertraline - Taking: not taking sertraline anymore (didn't notice a difference after taking a few days), taking atarax once nightly prn (doesn't feel it is working as much anymore) - Current stressors: nothing specific - Coping Mechanisms: sits down, sleeping - feels safe at home - Last GAD 18 on 10/24/18.  Review of Systems:  Per HPI.  [place], medications and smoking status reviewed.  Objective:   BP 111/80   Pulse 87   Temp 98.5 F (36.9 C) (Oral)   Wt 182 lb (82.6 kg)   LMP 02/20/2019   SpO2 99%   BMI 32.24 kg/m  Vitals and nursing note reviewed.  General: obese female, in no acute distress with non-toxic appearance CV: regular rate and rhythm without murmurs, rubs, or gallops, no lower extremity edema Lungs: clear to  auscultation bilaterally with normal work of breathing Skin: warm, dry, no rashes or lesions Extremities: warm and well perfused, normal tone MSK: ROM grossly intact, strength intact, gait normal Neuro: Alert and oriented, speech normal Psych: appropriately dressed, no flight of ideas or tangential thought process. Not fidgety.  GAD 7 : Generalized Anxiety Score 02/20/2019 10/24/2018 03/05/2018  Nervous, Anxious, on Edge 3 3 3   Control/stop worrying 3 3 3   Worry too much - different things 3 3 3   Trouble relaxing 0 3 2  Restless 1 0 3  Easily annoyed or irritable 0 3 0  Afraid - awful might happen 3 3 0  Total GAD 7 Score 13 18 14   Anxiety Difficulty Very difficult Somewhat difficult Not difficult at all    Assessment & Plan:   Other abnormal glucose Noted in ED. A1c today 5.7. Discussed lifestyle modifications of weight loss through diet and exercise, specifically walking after meals and incorporating more vegetables into her diet. Interested in nutrition referral, order placed. Instructed patient to call for appointment. Believe she may be able to avoid medications with lifestyle interventions.   Anxiety with depression GAD today 13. Previously on sertraline however only took for a few days, willing to try again. Counseling provided regarding expected time to see effect. Instructed to continue atarax at night prn. Discussed coping mechanisms and encouraged to find hobbies or interests that may help calm her when she gets anxious. Previously not willing to undergo counseling, will continue to assess readiness.  Follow up in 2-4 weeks.  Orders Placed This Encounter  Procedures  . Amb ref to Medical Nutrition Therapy-MNT    Referral Priority:   Routine    Referral Type:   Consultation    Referral Reason:   Specialty Services Required    Requested Specialty:   Nutrition    Number of Visits Requested:   1  . HgB A1c   Meds ordered this encounter  Medications  . sertraline (ZOLOFT) 25 MG  tablet    Sig: Take 1 tablet (25 mg total) by mouth daily.    Dispense:  90 tablet    Refill:  0    Rory Percy, DO PGY-2, [facility] 02/20/2019 1:43 PM

## 2019-02-20 NOTE — Assessment & Plan Note (Signed)
Noted in ED. A1c today 5.7. Discussed lifestyle modifications of weight loss through diet and exercise, specifically walking after meals and incorporating more vegetables into her diet. Interested in nutrition referral, order placed. Instructed patient to call for appointment. Believe she may be able to avoid medications with lifestyle interventions.

## 2019-02-20 NOTE — Assessment & Plan Note (Addendum)
GAD today 13. Previously on sertraline however only took for a few days, willing to try again. Counseling provided regarding expected time to see effect. Instructed to continue atarax at night prn. Discussed coping mechanisms and encouraged to find hobbies or interests that may help calm her when she gets anxious. Previously not willing to undergo counseling, will continue to assess readiness. Follow up in 2-4 weeks.

## 2019-03-06 ENCOUNTER — Ambulatory Visit: Payer: BLUE CROSS/BLUE SHIELD | Admitting: Registered"

## 2019-04-30 ENCOUNTER — Telehealth: Payer: Self-pay | Admitting: Family Medicine

## 2019-04-30 NOTE — Telephone Encounter (Signed)
Will forward to MD to advise. Allessandra Bernardi,CMA  

## 2019-04-30 NOTE — Telephone Encounter (Signed)
Pt called today stating that she isn't sure if she can continue to take sertraline, and hydrOXYzine. Please give pt a call back.

## 2019-05-07 NOTE — Telephone Encounter (Signed)
Patient calling and asking about the medications because she was just informed that she has a positive pregnancy test.  Patient is also wanting to establish her pregnancy care with our clinic.  Appointment scheduled for Monday, July 27 at 10:50 AM.    Patient does seem concerned that her insurance will not cover. She states that she has already applied to [facility] however unclear where her confusion is coming from.  Will ask our social worker to reach out to her in order to determine that she is taking all of the proper steps to help establish her pregnancy Medicaid.  Thank you.

## 2019-05-07 NOTE — Telephone Encounter (Signed)
Is the appointment you scheduled meant to only confirm her pregnancy or were you wanting it to be an initial OB?  Jasmin Ellis,CMA

## 2019-05-07 NOTE — Telephone Encounter (Addendum)
  05/07/2019 Name: Jasmin Ellis MRN: [id number] DOB: 1991/04/19  Referred by: Shirley, [place], DO Reason for referral : Advice Only  Jasmin Ellis is a 28 y.o. year old female who sees [place], [place], DO for primary care.  Referred by Dr. Enid Derry for assistance with sorting out concerns with her insurance.   LCSW introduced self and assessed needs and barriers. Assessment: Patient currently has [employer] from the [employer] place.  They have her listed as a female and will not cover her pregnancy. Also per patient [facility] will not take the [facility] insurance that she has.  Patient is frustrated and wants to just apply for medicaid. Discussed: She may not qualify for medicaid since she has insurance, contacting insurance provider to correct the error listing her as a female instead of female and finding out what medical providers are in network with her insurance.     Follow Up Plan:  Patient will contact [employer] to update her information inquire about in-network providers. Dr. Enid Derry has been notified of this outreach and plan.  Casimer Lanius, [place]   [phone] 11:17 AM

## 2019-05-11 ENCOUNTER — Other Ambulatory Visit: Payer: Self-pay

## 2019-05-11 ENCOUNTER — Inpatient Hospital Stay (HOSPITAL_COMMUNITY)
Admission: AD | Admit: 2019-05-11 | Discharge: 2019-05-11 | Disposition: A | Discharge status: Home / Self Care | Payer: BLUE CROSS/BLUE SHIELD | Attending: Obstetrics and Gynecology | Admitting: Obstetrics and Gynecology

## 2019-05-11 ENCOUNTER — Encounter (HOSPITAL_COMMUNITY): Payer: Self-pay | Admitting: *Deleted

## 2019-05-11 ENCOUNTER — Inpatient Hospital Stay (HOSPITAL_COMMUNITY): Payer: BLUE CROSS/BLUE SHIELD

## 2019-05-11 DIAGNOSIS — O26891 Other specified pregnancy related conditions, first trimester: Secondary | ICD-10-CM

## 2019-05-11 DIAGNOSIS — Z3490 Encounter for supervision of normal pregnancy, unspecified, unspecified trimester: Secondary | ICD-10-CM

## 2019-05-11 DIAGNOSIS — Z3A01 Less than 8 weeks gestation of pregnancy: Secondary | ICD-10-CM

## 2019-05-11 DIAGNOSIS — F419 Anxiety disorder, unspecified: Secondary | ICD-10-CM

## 2019-05-11 DIAGNOSIS — O99891 Other specified diseases and conditions complicating pregnancy: Secondary | ICD-10-CM

## 2019-05-11 DIAGNOSIS — O9989 Other specified diseases and conditions complicating pregnancy, childbirth and the puerperium: Secondary | ICD-10-CM

## 2019-05-11 DIAGNOSIS — R109 Unspecified abdominal pain: Secondary | ICD-10-CM

## 2019-05-11 DIAGNOSIS — R0602 Shortness of breath: Secondary | ICD-10-CM | POA: Diagnosis not present

## 2019-05-11 DIAGNOSIS — M545 Low back pain: Secondary | ICD-10-CM | POA: Insufficient documentation

## 2019-05-11 DIAGNOSIS — O99341 Other mental disorders complicating pregnancy, first trimester: Secondary | ICD-10-CM | POA: Diagnosis not present

## 2019-05-11 DIAGNOSIS — M549 Dorsalgia, unspecified: Secondary | ICD-10-CM | POA: Diagnosis not present

## 2019-05-11 DIAGNOSIS — O26899 Other specified pregnancy related conditions, unspecified trimester: Secondary | ICD-10-CM

## 2019-05-11 LAB — URINALYSIS, ROUTINE W REFLEX MICROSCOPIC
Bilirubin Urine: NEGATIVE
Glucose, UA: NEGATIVE mg/dL
Hgb urine dipstick: NEGATIVE
Ketones, ur: NEGATIVE mg/dL
Leukocytes,Ua: NEGATIVE
Nitrite: NEGATIVE
Protein, ur: NEGATIVE mg/dL
Specific Gravity, Urine: 1.017 (ref 1.005–1.030)
pH: 7 (ref 5.0–8.0)

## 2019-05-11 LAB — CBC
HCT: 34.3 % — ABNORMAL LOW (ref 36.0–46.0)
Hemoglobin: 11.6 g/dL — ABNORMAL LOW (ref 12.0–15.0)
MCH: 29.4 pg (ref 26.0–34.0)
MCHC: 33.8 g/dL (ref 30.0–36.0)
MCV: 87.1 fL (ref 80.0–100.0)
Platelets: 300 10*3/uL (ref 150–400)
RBC: 3.94 MIL/uL (ref 3.87–5.11)
RDW: 12.9 % (ref 11.5–15.5)
WBC: 7.4 10*3/uL (ref 4.0–10.5)
nRBC: 0 % (ref 0.0–0.2)

## 2019-05-11 LAB — GLUCOSE, CAPILLARY: Glucose-Capillary: 95 mg/dL (ref 70–99)

## 2019-05-11 LAB — HCG, QUANTITATIVE, PREGNANCY: hCG, Beta Chain, Quant, S: 26275 m[IU]/mL — ABNORMAL HIGH (ref <5)

## 2019-05-11 LAB — POCT PREGNANCY, URINE: Preg Test, Ur: POSITIVE — AB

## 2019-05-11 NOTE — [facility]U Provider Note (Signed)
History     CSN: [id number]  Arrival date and time: 05/11/19 1757   None     Chief Complaint  Patient presents with  . Abdominal Pain  . Back Pain  . Shortness of Breath   HPI   Jasmin Ellis is 28 y.o. [id number] female at [redacted]w[redacted]d LMP with history of GDM and anxiety who presents for abdominal pain and back pain. Reports she has had a few days of lower cramping abdominal pain and low back pain. She denies vaginal bleeding. Reports some SOB but says this is similar to how she feels when she is anxious.   OB History    Gravida  4   Para  3   Term  3   Preterm  0   AB  0   Living  3     SAB  0   TAB  0   Ectopic  0   Multiple      Live Births  3           Past Medical History:  Diagnosis Date  . Anemia   . Anxiety   . Back pain, chronic    s/p fall 8 years ago  . Diabetes mellitus without complication ([place])   . Fibroids     Past Surgical History:  Procedure Laterality Date  . NO PAST SURGERIES      Family History  Problem Relation Age of Onset  . Diabetes Father     Social History   Tobacco Use  . Smoking status: Never Smoker  . Smokeless tobacco: Never Used  Substance Use Topics  . Alcohol use: No  . Drug use: No    Allergies: No Known Allergies  Medications Prior to Admission  Medication Sig Dispense Refill Last Dose  . hydrOXYzine (ATARAX/VISTARIL) 10 MG tablet Take 1 tablet (10 mg total) by mouth 3 (three) times daily as needed for anxiety. 30 tablet 1   . ibuprofen (ADVIL,MOTRIN) 600 MG tablet Take 1 tablet (600 mg total) by mouth every 6 (six) hours as needed. 30 tablet 0   . naproxen (NAPROSYN) 500 MG tablet Take 1 tablet (500 mg total) by mouth 2 (two) times daily. 30 tablet 0   . sertraline (ZOLOFT) 25 MG tablet Take 1 tablet (25 mg total) by mouth daily. 90 tablet 0     Review of Systems  Constitutional: Negative for chills and fever.  HENT: Negative for congestion and sore throat.   Respiratory: Positive for shortness of  breath. Negative for cough, chest tightness and wheezing.   Cardiovascular: Negative for chest pain.  Gastrointestinal: Positive for abdominal pain. Negative for constipation, diarrhea, nausea and vomiting.  Genitourinary: Negative for difficulty urinating, dysuria, urgency, vaginal bleeding and vaginal discharge.  Musculoskeletal: Positive for back pain.  Skin: Negative for rash.  Neurological: Negative for light-headedness and headaches.   Physical Exam   Blood pressure 139/63, pulse 86, temperature 97.9 F (36.6 C), temperature source Oral, resp. rate 17, weight 87 kg, last menstrual period 03/27/2019.  Physical Exam  Constitutional: She appears well-developed and well-nourished.  Appears anxious but non-toxic.   HENT:  Head: Normocephalic and atraumatic.  Mouth/Throat: Oropharynx is clear and moist.  Eyes: Conjunctivae and EOM are normal.  Neck: Normal range of motion. Neck supple.  Cardiovascular: Normal rate, regular rhythm and normal heart sounds.  Respiratory: Effort normal and breath sounds normal. No respiratory distress. She has no wheezes.  GI: Soft. She exhibits no distension. There is no abdominal tenderness. There  is no rebound and no guarding.  Musculoskeletal: Normal range of motion.  Neurological: She is alert.  Skin: Skin is warm and dry.    MAU Course  Procedures  MDM Given report of abdominal cramping and back pain, will obtain ectopic labs and ultrasound to confirm IUP.   Assessment and Plan   1. Intrauterine pregnancy   2. Cramping affecting pregnancy, antepartum   3. Abdominal pain during pregnancy in first trimester   4. Back pain affecting pregnancy in first trimester   5. Anxiety   6. Shortness of breath    Ultrasound shows live IUP. Small subchorionic hematoma and FHR 90 bpm noted on results. Have ruled out ectopic pregnancy as etiology of pain. Patient has not had any vaginal bleeding, but threatened SAB remains on differential. Suspect SOB is  related to anxiety. Patient has stable vital signs, no other related symptoms, and lungs are clear on exam. Have instructed patient to follow up in clinic in the next 10 days.   Melina Schools 05/11/2019, 6:23 PM

## 2019-05-11 NOTE — Discharge Instructions (Signed)
Abdominal Pain During Pregnancy ° °Belly (abdominal) pain is common during pregnancy. There are many possible causes. Most of the time, it is not a serious problem. Other times, it can be a sign that something is wrong with the pregnancy. Always tell your doctor if you have belly pain. °Follow these instructions at home: °· Do not have sex or put anything in your vagina until your pain goes away completely. °· Get plenty of rest until your pain gets better. °· Drink enough fluid to keep your pee (urine) pale yellow. °· Take over-the-counter and prescription medicines only as told by your doctor. °· Keep all follow-up visits as told by your doctor. This is important. °Contact a doctor if: °· Your pain continues or gets worse after resting. °· You have lower belly pain that: °? Comes and goes at regular times. °? Spreads to your back. °? Feels like menstrual cramps. °· You have pain or burning when you pee (urinate). °Get help right away if: °· You have a fever or chills. °· You have vaginal bleeding. °· You are leaking fluid from your vagina. °· You are passing tissue from your vagina. °· You throw up (vomit) for more than 24 hours. °· You have watery poop (diarrhea) for more than 24 hours. °· Your baby is moving less than usual. °· You feel very weak or faint. °· You have shortness of breath. °· You have very bad pain in your upper belly. °Summary °· Belly (abdominal) pain is common during pregnancy. There are many possible causes. °· If you have belly pain during pregnancy, tell your doctor right away. °· Keep all follow-up visits as told by your doctor. This is important. °This information is not intended to replace advice given to you by your health care provider. Make sure you discuss any questions you have with your health care provider. °Document Released: 09/21/2009 Document Revised: 01/21/2019 Document Reviewed: 01/05/2017 °Elsevier Patient Education © 2020 Elsevier Inc. ° °

## 2019-05-11 NOTE — MAU Note (Addendum)
Jasmin Ellis is a 28 y.o. at [redacted]w[redacted]d by LMP is here in MAU reporting:  "I dont feel good. I feel all the time I'm sick. difficult to breathe" Denies recent travel.  LMP: 03/27/19, +HPT , confirmed at the [facility] office  +lower abdominal and back pain. Cramping. intermittent Onset of complaint: ongoing 5 days Pain score: 9/10 Vitals:   05/11/19 1810  BP: 139/63  Pulse: 86  Resp: 17  Temp: 97.9 F (36.6 C)     Lab orders placed from triage: ua and poc pregnancy

## 2019-05-13 ENCOUNTER — Ambulatory Visit: Payer: BLUE CROSS/BLUE SHIELD | Admitting: Family Medicine

## 2019-06-07 ENCOUNTER — Ambulatory Visit (INDEPENDENT_AMBULATORY_CARE_PROVIDER_SITE_OTHER): Payer: BLUE CROSS/BLUE SHIELD | Admitting: Student in an Organized Health Care Education/Training Program

## 2019-06-07 ENCOUNTER — Ambulatory Visit: Payer: BLUE CROSS/BLUE SHIELD | Admitting: Family Medicine

## 2019-06-07 ENCOUNTER — Other Ambulatory Visit: Payer: Self-pay

## 2019-06-07 VITALS — BP 118/64 | HR 86 | Wt 194.8 lb

## 2019-06-07 DIAGNOSIS — Z3491 Encounter for supervision of normal pregnancy, unspecified, first trimester: Secondary | ICD-10-CM | POA: Diagnosis not present

## 2019-06-07 DIAGNOSIS — Z3200 Encounter for pregnancy test, result unknown: Secondary | ICD-10-CM

## 2019-06-07 DIAGNOSIS — Z30432 Encounter for removal of intrauterine contraceptive device: Secondary | ICD-10-CM | POA: Insufficient documentation

## 2019-06-07 DIAGNOSIS — Z8632 Personal history of gestational diabetes: Secondary | ICD-10-CM | POA: Diagnosis not present

## 2019-06-07 LAB — POCT URINALYSIS DIP (MANUAL ENTRY)
Bilirubin, UA: NEGATIVE
Blood, UA: NEGATIVE
Glucose, UA: NEGATIVE mg/dL
Ketones, POC UA: NEGATIVE mg/dL
Leukocytes, UA: NEGATIVE
Nitrite, UA: NEGATIVE
Protein Ur, POC: NEGATIVE mg/dL
Spec Grav, UA: 1.015 (ref 1.010–1.025)
Urobilinogen, UA: 0.2 E.U./dL
pH, UA: 7.5 (ref 5.0–8.0)

## 2019-06-07 LAB — POCT URINE PREGNANCY: Preg Test, Ur: POSITIVE — AB

## 2019-06-07 LAB — POCT GLYCOSYLATED HEMOGLOBIN (HGB A1C): Hemoglobin A1C: 6 % — AB (ref 4.0–5.6)

## 2019-06-07 NOTE — Patient Instructions (Addendum)
It was a pleasure to see you today!  To summarize our discussion for this visit:  We are collecting bloodwork and urine for your pregnancy screening today.  I have offered counseling at this time for depression/anxiety which you have declined at this time but can change your mind at any time and we can help you find a counselor/therapist.   Please follow up with your initial OB visit on August 25th at 830am.  Please continue to take your prenatal vitamins with iron   Call the clinic at [phone] if your symptoms worsen or you have any concerns.   Thank you for allowing me to take part in your care,  Dr. Doristine Mango   Prenatal Care Prenatal care is health care during pregnancy. It helps you and your unborn baby (fetus) stay as healthy as possible. Prenatal care may be provided by a midwife, a family practice health care provider, or a childbirth and pregnancy specialist (obstetrician). How does this affect me? During pregnancy, you will be closely monitored for any new conditions that might develop. To lower your risk of pregnancy complications, you and your health care provider will talk about any underlying conditions you have. How does this affect my baby? Early and consistent prenatal care increases the chance that your baby will be healthy during pregnancy. Prenatal care lowers the risk that your baby will be:  Born early (prematurely).  Smaller than expected at birth (small for gestational age). What can I expect at the first prenatal care visit? Your first prenatal care visit will likely be the longest. You should schedule your first prenatal care visit as soon as you know that you are pregnant. Your first visit is a good time to talk about any questions or concerns you have about pregnancy. At your visit, you and your health care provider will talk about:  Your medical history, including: ? Any past pregnancies. ? Your family's medical history. ? The baby's father's  medical history. ? Any long-term (chronic) health conditions you have and how you manage them. ? Any surgeries or procedures you have had. ? Any current over-the-counter or prescription medicines, herbs, or supplements you are taking.  Other factors that could pose a risk to your baby, including:  Your home setting and your stress levels, including: ? Exposure to abuse or violence. ? Household financial strain. ? Mental health conditions you have.  Your daily health habits, including diet and exercise. Your health care provider will also:  Measure your weight, height, and blood pressure.  Do a physical exam, including a pelvic and breast exam.  Perform blood tests and urine tests to check for: ? Urinary tract infection. ? Sexually transmitted infections (STIs). ? Low iron levels in your blood (anemia). ? Blood type and certain proteins on red blood cells (Rh antibodies). ? Infections and immunity to viruses, such as hepatitis B and rubella. ? HIV (human immunodeficiency virus).  Do an ultrasound to confirm your baby's growth and development and to help predict your estimated due date (EDD). This ultrasound is done with a probe that is inserted into the vagina (transvaginal ultrasound).  Discuss your options for genetic screening.  Give you information about how to keep yourself and your baby healthy, including: ? Nutrition and taking vitamins. ? Physical activity. ? How to manage pregnancy symptoms such as nausea and vomiting (morning sickness). ? Infections and substances that may be harmful to your baby and how to avoid them. ? Food safety. ? Dental care. ? Working. ?  Travel. ? Warning signs to watch for and when to call your health care provider. How often will I have prenatal care visits? After your first prenatal care visit, you will have regular visits throughout your pregnancy. The visit schedule is often as follows:  Up to week 28 of pregnancy: once every 4  weeks.  28-36 weeks: once every 2 weeks.  After 36 weeks: every week until delivery. Some women may have visits more or less often depending on any underlying health conditions and the health of the baby. Keep all follow-up and prenatal care visits as told by your health care provider. This is important. What happens during routine prenatal care visits? Your health care provider will:  Measure your weight and blood pressure.  Check for fetal heart sounds.  Measure the height of your uterus in your abdomen (fundal height). This may be measured starting around week 20 of pregnancy.  Check the position of your baby inside your uterus.  Ask questions about your diet, sleeping patterns, and whether you can feel the baby move.  Review warning signs to watch for and signs of labor.  Ask about any pregnancy symptoms you are having and how you are dealing with them. Symptoms may include: ? Headaches. ? Nausea and vomiting. ? Vaginal discharge. ? Swelling. ? Fatigue. ? Constipation. ? Any discomfort, including back or pelvic pain. Make a list of questions to ask your health care provider at your routine visits. What tests might I have during prenatal care visits? You may have blood, urine, and imaging tests throughout your pregnancy, such as:  Urine tests to check for glucose, protein, or signs of infection.  Glucose tests to check for a form of diabetes that can develop during pregnancy (gestational diabetes mellitus). This is usually done around week 24 of pregnancy.  An ultrasound to check your baby's growth and development and to check for birth defects. This is usually done around week 20 of pregnancy.  A test to check for group B strep (GBS) infection. This is usually done around week 36 of pregnancy.  Genetic testing. This may include blood or imaging tests, such as an ultrasound. Some genetic tests are done during the first trimester and some are done during the second  trimester. What else can I expect during prenatal care visits? Your health care provider may recommend getting certain vaccines during pregnancy. These may include:  A yearly flu shot (annual influenza vaccine). This is especially important if you will be pregnant during flu season.  Tdap (tetanus, diphtheria, pertussis) vaccine. Getting this vaccine during pregnancy can protect your baby from whooping cough (pertussis) after birth. This vaccine may be recommended between weeks 27 and 36 of pregnancy. Later in your pregnancy, your health care provider may give you information about:  Childbirth and breastfeeding classes.  Choosing a health care provider for your baby.  Umbilical cord banking.  Breastfeeding.  Birth control after your baby is born.  The hospital labor and delivery unit and how to tour it.  Registering at the hospital before you go into labor. Where to find more information  Office on Women's Health: LegalWarrants.gl  American Pregnancy Association: americanpregnancy.org  March of Dimes: marchofdimes.org Summary  Prenatal care helps you and your baby stay as healthy as possible during pregnancy.  Your first prenatal care visit will most likely be the longest.  You will have visits and tests throughout your pregnancy to monitor your health and your baby's health.  Bring a list of questions  to your visits to ask your health care provider.  Make sure to keep all follow-up and prenatal care visits with your health care provider. This information is not intended to replace advice given to you by your health care provider. Make sure you discuss any questions you have with your health care provider. Document Released: 10/06/2003 Document Revised: 01/23/2019 Document Reviewed: 10/02/2017 Elsevier Patient Education  2020 [employer].

## 2019-06-07 NOTE — Assessment & Plan Note (Deleted)
Urine pregnancy test collected today

## 2019-06-07 NOTE — Assessment & Plan Note (Addendum)
[place] (Dr. Chrisandra Netters - pager [phone])  Genetic Screen    Anatomic [place]    Glucose Screen    GBS    Feeding Preference    Contraception    Circumcision    Pap     Initial appointment to obtain labs.  Reschedule patient for initial OB visit.  Screen positive for moderate depression, negative SI. Patient declined counseling. Continue to take prenatals with iron supplement. Obtaining hemoglobin A1c today to monitor diabetes. Anticipatory guidance given.

## 2019-06-07 NOTE — Progress Notes (Signed)
   Subjective:    Patient ID: Jasmin Ellis, female    DOB: 12-Oct-1991, 28 y.o.   MRN: [id number]   CC: Pregnancy test  HPI:  She presents today with known pregnancy test from health department.  Patient was not scheduled for a initial OB visit so will therefore be brought back for another visit.  Her PCP is not available for about another month so will schedule her for earliest available (8/25). Obtain initial labs today so that they may be reviewed at next appointment. She is agreeable to this plan.  She is confident in the correct LMP date for dating. She has previously had gestational diabetes and would like to make sure that her baby is okay.  Patient's feeling well physically overall but is endorsing some signs of depression and anxiety.  She mostly worries that something bad will happen to her or her baby.  PHQ 9 today is a score of 7 with negative to question 9.  She declines referral to counseling at this time. She states that she feels safe at home and in her relationship. She feels like she has plenty of support.   History of anemia- patient is already taking a prenatal vitamin daily with iron supplement.   Smoking status reviewed   ROS: pertinent noted in the HPI   Past medical history, surgical, family, and social history reviewed and updated in the EMR as appropriate.  Objective:  BP 118/64   Pulse 86   Wt 194 lb 12.8 oz (88.4 kg)   LMP 03/27/2019   SpO2 100%   BMI 34.51 kg/m   Vitals and nursing note reviewed  General: NAD, pleasant, able to participate in exam Cardiac: RRR, S1 S2 present. normal heart sounds, no murmurs. Respiratory: CTAB, normal effort, No wheezes, rales or rhonchi Extremities: no edema or cyanosis. Skin: warm and dry, no rashes noted Neuro: alert, no obvious focal deficits Psych: Normal affect and mood   Assessment & Plan:    First trimester pregnancy [place] (Dr. Chrisandra Netters - pager [phone])  Genetic  Screen    Anatomic [place]    Glucose Screen    GBS    Feeding Preference    Contraception    Circumcision    Pap     Initial appointment to obtain labs.  Reschedule patient for initial OB visit.  Screen positive for moderate depression, negative SI. Patient declined counseling. Continue to take prenatals with iron supplement. Obtaining hemoglobin A1c today to monitor diabetes. Anticipatory guidance given.    Doristine Mango, [facility] PGY-2

## 2019-06-07 NOTE — Progress Notes (Deleted)
  Subjective:   Patient ID: Jasmin Ellis    DOB: Apr 24, 1991, 28 y.o. female   MRN: [id number]  Jasmin Ellis is a 28 y.o. female with a history of gestational diabetes, chronic back pain, anxiety/depression, insomnia here for   Preg test [id number], previously seen at [facility]U with positive preg test and confirmed IUP via [place] with LMP ***03/27/19. - ***  Review of Systems:  Per HPI.  [place], medications and smoking status reviewed.  Objective:   LMP 03/27/2019  Vitals and nursing note reviewed.  General: well nourished, well developed, in no acute distress with non-toxic appearance HEENT: normocephalic, atraumatic, moist mucous membranes Neck: supple, non-tender without lymphadenopathy CV: regular rate and rhythm without murmurs, rubs, or gallops, no lower extremity edema Lungs: clear to auscultation bilaterally with normal work of breathing Abdomen: soft, non-tender, non-distended, no masses or organomegaly palpable, normoactive bowel sounds Skin: warm, dry, no rashes or lesions Extremities: warm and well perfused, normal tone MSK: ROM grossly intact, strength intact, gait normal Neuro: Alert and oriented, speech normal  Assessment & Plan:   No problem-specific Assessment & Plan notes found for this encounter.  No orders of the defined types were placed in this encounter.  No orders of the defined types were placed in this encounter.   Rory Percy, DO PGY-3, [facility] 06/07/2019 10:48 AM

## 2019-06-10 LAB — HGB FRAC. W/SOLUBILITY
Hgb A2 Quant: 2.5 % (ref 1.8–3.2)
Hgb A: 97.5 % (ref 96.4–98.8)
Hgb C: 0 %
Hgb F Quant: 0 % (ref 0.0–2.0)
Hgb S: 0 %
Hgb Solubility: NEGATIVE
Hgb Variant: 0 %

## 2019-06-10 LAB — OBSTETRIC PANEL, INCLUDING HIV
Antibody Screen: NEGATIVE
Basophils Absolute: 0 10*3/uL (ref 0.0–0.2)
Basos: 1 %
EOS (ABSOLUTE): 0.3 10*3/uL (ref 0.0–0.4)
Eos: 4 %
HIV Screen 4th Generation wRfx: NONREACTIVE
Hematocrit: 36.2 % (ref 34.0–46.6)
Hemoglobin: 12.4 g/dL (ref 11.1–15.9)
Hepatitis B Surface Ag: NEGATIVE
Immature Grans (Abs): 0 10*3/uL (ref 0.0–0.1)
Immature Granulocytes: 0 %
Lymphocytes Absolute: 2.3 10*3/uL (ref 0.7–3.1)
Lymphs: 28 %
MCH: 29.2 pg (ref 26.6–33.0)
MCHC: 34.3 g/dL (ref 31.5–35.7)
MCV: 85 fL (ref 79–97)
Monocytes Absolute: 0.5 10*3/uL (ref 0.1–0.9)
Monocytes: 7 %
Neutrophils Absolute: 5 10*3/uL (ref 1.4–7.0)
Neutrophils: 60 %
Platelets: 307 10*3/uL (ref 150–450)
RBC: 4.24 x10E6/uL (ref 3.77–5.28)
RDW: 12.7 % (ref 11.7–15.4)
RPR Ser Ql: NONREACTIVE
Rh Factor: POSITIVE
Rubella Antibodies, IGG: 15.7 index (ref >0.99)
WBC: 8.2 10*3/uL (ref 3.4–10.8)

## 2019-06-11 ENCOUNTER — Other Ambulatory Visit: Payer: Self-pay

## 2019-06-11 ENCOUNTER — Other Ambulatory Visit (HOSPITAL_COMMUNITY)
Admission: RE | Admit: 2019-06-11 | Discharge: 2019-06-11 | Disposition: A | Discharge status: Home / Self Care | Payer: Medicaid Other | Source: Ambulatory Visit | Attending: Family Medicine | Admitting: Family Medicine

## 2019-06-11 ENCOUNTER — Ambulatory Visit (INDEPENDENT_AMBULATORY_CARE_PROVIDER_SITE_OTHER): Payer: Medicaid Other | Admitting: Family Medicine

## 2019-06-11 VITALS — BP 124/70 | HR 90 | Wt 195.0 lb

## 2019-06-11 DIAGNOSIS — Z3491 Encounter for supervision of normal pregnancy, unspecified, first trimester: Secondary | ICD-10-CM

## 2019-06-11 DIAGNOSIS — O24119 Pre-existing diabetes mellitus, type 2, in pregnancy, unspecified trimester: Secondary | ICD-10-CM

## 2019-06-11 DIAGNOSIS — M549 Dorsalgia, unspecified: Secondary | ICD-10-CM

## 2019-06-11 DIAGNOSIS — Z124 Encounter for screening for malignant neoplasm of cervix: Secondary | ICD-10-CM

## 2019-06-11 LAB — POCT URINALYSIS DIP (MANUAL ENTRY)
Bilirubin, UA: NEGATIVE
Blood, UA: NEGATIVE
Glucose, UA: 100 mg/dL — AB
Leukocytes, UA: NEGATIVE
Nitrite, UA: NEGATIVE
Spec Grav, UA: 1.025 (ref 1.010–1.025)
Urobilinogen, UA: 1 E.U./dL
pH, UA: 7 (ref 5.0–8.0)

## 2019-06-11 LAB — URINE CULTURE, OB REFLEX

## 2019-06-11 LAB — CULTURE, OB URINE

## 2019-06-11 NOTE — Patient Instructions (Signed)
First Trimester of Pregnancy  The first trimester of pregnancy is from week 1 until the end of week 13 (months 1 through 3). During this time, your baby will begin to develop inside you. At 6-8 weeks, the eyes and face are formed, and the heartbeat can be seen on ultrasound. At the end of 12 weeks, all the baby's organs are formed. Prenatal care is all the medical care you receive before the birth of your baby. Make sure you get good prenatal care and follow all of your doctor's instructions. Follow these instructions at home: Medicines  Take over-the-counter and prescription medicines only as told by your doctor. Some medicines are safe and some medicines are not safe during pregnancy.  Take a prenatal vitamin that contains at least 600 micrograms (mcg) of folic acid.  If you have trouble pooping (constipation), take medicine that will make your stool soft (stool softener) if your doctor approves. Eating and drinking   Eat regular, healthy meals.  Your doctor will tell you the amount of weight gain that is right for you.  Avoid raw meat and uncooked cheese.  If you feel sick to your stomach (nauseous) or throw up (vomit): ? Eat 4 or 5 small meals a day instead of 3 large meals. ? Try eating a few soda crackers. ? Drink liquids between meals instead of during meals.  To prevent constipation: ? Eat foods that are high in fiber, like fresh fruits and vegetables, whole grains, and beans. ? Drink enough fluids to keep your pee (urine) clear or pale yellow. Activity  Exercise only as told by your doctor. Stop exercising if you have cramps or pain in your lower belly (abdomen) or low back.  Do not exercise if it is too hot, too humid, or if you are in a place of great height (high altitude).  Try to avoid standing for long periods of time. Move your legs often if you must stand in one place for a long time.  Avoid heavy lifting.  Wear low-heeled shoes. Sit and stand up straight.   You can have sex unless your doctor tells you not to. Relieving pain and discomfort  Wear a good support bra if your breasts are sore.  Take warm water baths (sitz baths) to soothe pain or discomfort caused by hemorrhoids. Use hemorrhoid cream if your doctor says it is okay.  Rest with your legs raised if you have leg cramps or low back pain.  If you have puffy, bulging veins (varicose veins) in your legs: ? Wear support hose or compression stockings as told by your doctor. ? Raise (elevate) your feet for 15 minutes, 3-4 times a day. ? Limit salt in your food. Prenatal care  Schedule your prenatal visits by the twelfth week of pregnancy.  Write down your questions. Take them to your prenatal visits.  Keep all your prenatal visits as told by your doctor. This is important. Safety  Wear your seat belt at all times when driving.  Make a list of emergency phone numbers. The list should include numbers for family, friends, the hospital, and police and fire departments. General instructions  Ask your doctor for a referral to a local prenatal class. Begin classes no later than at the start of month 6 of your pregnancy.  Ask for help if you need counseling or if you need help with nutrition. Your doctor can give you advice or tell you where to go for help.  Do not use hot tubs, steam   rooms, or saunas.  Do not douche or use tampons or scented sanitary pads.  Do not cross your legs for long periods of time.  Avoid all herbs and alcohol. Avoid drugs that are not approved by your doctor.  Do not use any tobacco products, including cigarettes, chewing tobacco, and electronic cigarettes. If you need help quitting, ask your doctor. You may get counseling or other support to help you quit.  Avoid cat litter boxes and soil used by cats. These carry germs that can cause birth defects in the baby and can cause a loss of your baby (miscarriage) or stillbirth.  Visit your dentist. At home,  brush your teeth with a soft toothbrush. Be gentle when you floss. Contact a doctor if:  You are dizzy.  You have mild cramps or pressure in your lower belly.  You have a nagging pain in your belly area.  You continue to feel sick to your stomach, you throw up, or you have watery poop (diarrhea).  You have a bad smelling fluid coming from your vagina.  You have pain when you pee (urinate).  You have increased puffiness (swelling) in your face, hands, legs, or ankles. Get help right away if:  You have a fever.  You are leaking fluid from your vagina.  You have spotting or bleeding from your vagina.  You have very bad belly cramping or pain.  You gain or lose weight rapidly.  You throw up blood. It may look like coffee grounds.  You are around people who have [place] measles, fifth disease, or chickenpox.  You have a very bad headache.  You have shortness of breath.  You have any kind of trauma, such as from a fall or a car accident. Summary  The first trimester of pregnancy is from week 1 until the end of week 13 (months 1 through 3).  To take care of yourself and your unborn baby, you will need to eat healthy meals, take medicines only if your doctor tells you to do so, and do activities that are safe for you and your baby.  Keep all follow-up visits as told by your doctor. This is important as your doctor will have to ensure that your baby is healthy and growing well. This information is not intended to replace advice given to you by your health care provider. Make sure you discuss any questions you have with your health care provider. Document Released: 03/21/2008 Document Revised: 01/24/2019 Document Reviewed: 10/11/2016 Elsevier Patient Education  2020 [employer].   Prenatal Care Prenatal care is health care during pregnancy. It helps you and your unborn baby (fetus) stay as healthy as possible. Prenatal care may be provided by a midwife, a family practice  health care provider, or a childbirth and pregnancy specialist (obstetrician). How does this affect me? During pregnancy, you will be closely monitored for any new conditions that might develop. To lower your risk of pregnancy complications, you and your health care provider will talk about any underlying conditions you have. How does this affect my baby? Early and consistent prenatal care increases the chance that your baby will be healthy during pregnancy. Prenatal care lowers the risk that your baby will be:  Born early (prematurely).  Smaller than expected at birth (small for gestational age). What can I expect at the first prenatal care visit? Your first prenatal care visit will likely be the longest. You should schedule your first prenatal care visit as soon as you know that you are  pregnant. Your first visit is a good time to talk about any questions or concerns you have about pregnancy. At your visit, you and your health care provider will talk about:  Your medical history, including: ? Any past pregnancies. ? Your family's medical history. ? The baby's father's medical history. ? Any long-term (chronic) health conditions you have and how you manage them. ? Any surgeries or procedures you have had. ? Any current over-the-counter or prescription medicines, herbs, or supplements you are taking.  Other factors that could pose a risk to your baby, including:  Your home setting and your stress levels, including: ? Exposure to abuse or violence. ? Household financial strain. ? Mental health conditions you have.  Your daily health habits, including diet and exercise. Your health care provider will also:  Measure your weight, height, and blood pressure.  Do a physical exam, including a pelvic and breast exam.  Perform blood tests and urine tests to check for: ? Urinary tract infection. ? Sexually transmitted infections (STIs). ? Low iron levels in your blood (anemia). ? Blood  type and certain proteins on red blood cells (Rh antibodies). ? Infections and immunity to viruses, such as hepatitis B and rubella. ? HIV (human immunodeficiency virus).  Do an ultrasound to confirm your baby's growth and development and to help predict your estimated due date (EDD). This ultrasound is done with a probe that is inserted into the vagina (transvaginal ultrasound).  Discuss your options for genetic screening.  Give you information about how to keep yourself and your baby healthy, including: ? Nutrition and taking vitamins. ? Physical activity. ? How to manage pregnancy symptoms such as nausea and vomiting (morning sickness). ? Infections and substances that may be harmful to your baby and how to avoid them. ? Food safety. ? Dental care. ? Working. ? Travel. ? Warning signs to watch for and when to call your health care provider. How often will I have prenatal care visits? After your first prenatal care visit, you will have regular visits throughout your pregnancy. The visit schedule is often as follows:  Up to week 28 of pregnancy: once every 4 weeks.  28-36 weeks: once every 2 weeks.  After 36 weeks: every week until delivery. Some women may have visits more or less often depending on any underlying health conditions and the health of the baby. Keep all follow-up and prenatal care visits as told by your health care provider. This is important. What happens during routine prenatal care visits? Your health care provider will:  Measure your weight and blood pressure.  Check for fetal heart sounds.  Measure the height of your uterus in your abdomen (fundal height). This may be measured starting around week 20 of pregnancy.  Check the position of your baby inside your uterus.  Ask questions about your diet, sleeping patterns, and whether you can feel the baby move.  Review warning signs to watch for and signs of labor.  Ask about any pregnancy symptoms you are  having and how you are dealing with them. Symptoms may include: ? Headaches. ? Nausea and vomiting. ? Vaginal discharge. ? Swelling. ? Fatigue. ? Constipation. ? Any discomfort, including back or pelvic pain. Make a list of questions to ask your health care provider at your routine visits. What tests might I have during prenatal care visits? You may have blood, urine, and imaging tests throughout your pregnancy, such as:  Urine tests to check for glucose, protein, or signs of infection.  Glucose tests to check for a form of diabetes that can develop during pregnancy (gestational diabetes mellitus). This is usually done around week 24 of pregnancy.  An ultrasound to check your baby's growth and development and to check for birth defects. This is usually done around week 20 of pregnancy.  A test to check for group B strep (GBS) infection. This is usually done around week 36 of pregnancy.  Genetic testing. This may include blood or imaging tests, such as an ultrasound. Some genetic tests are done during the first trimester and some are done during the second trimester. What else can I expect during prenatal care visits? Your health care provider may recommend getting certain vaccines during pregnancy. These may include:  A yearly flu shot (annual influenza vaccine). This is especially important if you will be pregnant during flu season.  Tdap (tetanus, diphtheria, pertussis) vaccine. Getting this vaccine during pregnancy can protect your baby from whooping cough (pertussis) after birth. This vaccine may be recommended between weeks 27 and 36 of pregnancy. Later in your pregnancy, your health care provider may give you information about:  Childbirth and breastfeeding classes.  Choosing a health care provider for your baby.  Umbilical cord banking.  Breastfeeding.  Birth control after your baby is born.  The hospital labor and delivery unit and how to tour it.  Registering at the  hospital before you go into labor. Where to find more information  Office on Women's Health: LegalWarrants.gl  American Pregnancy Association: americanpregnancy.org  March of Dimes: marchofdimes.org Summary  Prenatal care helps you and your baby stay as healthy as possible during pregnancy.  Your first prenatal care visit will most likely be the longest.  You will have visits and tests throughout your pregnancy to monitor your health and your baby's health.  Bring a list of questions to your visits to ask your health care provider.  Make sure to keep all follow-up and prenatal care visits with your health care provider. This information is not intended to replace advice given to you by your health care provider. Make sure you discuss any questions you have with your health care provider. Document Released: 10/06/2003 Document Revised: 01/23/2019 Document Reviewed: 10/02/2017 Elsevier Patient Education  2020 [employer].

## 2019-06-11 NOTE — Progress Notes (Signed)
Back pain 8/10, O2-99%

## 2019-06-11 NOTE — Progress Notes (Signed)
Subjective: Chief Complaint  Patient presents with  . Initial Prenatal Visit  . Back Pain    HPI: Jasmin Ellis is a 28 y.o. presenting to clinic today to discuss the following:  Jasmin Ellis is a 28 y.o. yo [id number] at 102w6d who presents for her initial prenatal visit. No history of c-section, fetal anomolies, or premature labor. Pregnancy is planned. She reports back pain. 3 days that is not getting worse but not changing or getting better. She  is taking PNV. See flow sheet for details.  PMH, POBH, FH, meds, allergies and Social Hx reviewed.   ROS noted in HPI.   Past Medical, Surgical, Social, and Family History Reviewed & Updated per EMR.   Pertinent Historical Findings include:   Social History   Tobacco Use  Smoking Status Never Smoker  Smokeless Tobacco Never Used    Objective: BP 124/70   Pulse 90   Wt 195 lb (88.5 kg)   LMP 03/27/2019   BMI 34.54 kg/m  Vitals and nursing notes reviewed  Physical Exam Prenatal Exam: Gen: Well nourished, well developed.  No distress.  Vitals noted. HEENT: Normocephalic, atraumatic.  Neck supple without cervical lymphadenopathy, thyromegaly or thyroid nodules. CV: RRR no murmur, gallops or rubs Lungs: CTAB.  Normal respiratory effort without wheezes or rales. Abd: soft, NTND. +BS.  Uterus not appreciated above pelvis. GU: Normal external female genitalia without lesions.  Normal vaginal, well rugated without lesions. No vaginal discharge.  Bimanual exam: No adnexal mass or TTP. No CMT.  Uterus size normal, cervix closed Ext: No clubbing, cyanosis or edema. Psych: Normal grooming and dress. Not depressed or anxious appearing.  Results for orders placed or performed in visit on 06/11/19 (from the past 72 hour(s))  POCT urinalysis dipstick     Status: Abnormal   Collection Time: 06/11/19  8:45 AM  Result Value Ref Range   Color, UA yellow yellow   Clarity, UA clear clear   Glucose, UA =100 (A) negative mg/dL    Bilirubin, UA negative negative   Ketones, POC UA trace (5) (A) negative mg/dL   Spec Grav, UA 1.025 1.010 - 1.025   Blood, UA negative negative   pH, UA 7.0 5.0 - 8.0   Protein Ur, POC trace (A) negative mg/dL   Urobilinogen, UA 1.0 0.2 or 1.0 E.U./dL   Nitrite, UA Negative Negative   Leukocytes, UA Negative Negative    Assessment/Plan:  Assessment & Plan: Prenatal care in First Trimester 1) 28 y.o. yo [id number] at [redacted]w[redacted]d via LMP doing well.  Current pregnancy issues include T2DM. Referral to high risk OB. Dating is reliable, dated by U/S at [redacted]w[redacted]d that corresponds to LMP date of 03/27/2019. Prenatal labs reviewed, notable for A1c of 6.0% she is a known diabetic that is controlled without medications. Repeat PHQ-9 at next visit. Genetic screening offered: Yes, declined. Early glucola is not indicated.  PHQ-9 and Pregnancy Medical Home forms completed and reviewed.    PATIENT EDUCATION PROVIDED: See AVS    Diagnosis and plan along with any newly prescribed medication(s) were discussed in detail with this patient today. The patient verbalized understanding and agreed with the plan. Patient advised if symptoms worsen return to clinic or ER.    Orders Placed This Encounter  Procedures  . Ambulatory referral to Obstetrics / Gynecology    Referral Priority:   Routine    Referral Type:   Consultation    Referral Reason:   Specialty Services Required  Requested Specialty:   Obstetrics and Gynecology    Number of Visits Requested:   1  . POCT urinalysis dipstick    No orders of the defined types were placed in this encounter.    Harolyn Rutherford, DO 06/11/2019, 9:57 AM PGY-3 [place]

## 2019-06-12 LAB — CYTOLOGY - PAP
Chlamydia: NEGATIVE
Diagnosis: NEGATIVE
HPV: NOT DETECTED
Neisseria Gonorrhea: NEGATIVE

## 2019-06-23 ENCOUNTER — Other Ambulatory Visit: Payer: Self-pay

## 2019-06-23 ENCOUNTER — Encounter (HOSPITAL_COMMUNITY): Payer: Self-pay | Admitting: *Deleted

## 2019-06-23 ENCOUNTER — Inpatient Hospital Stay (HOSPITAL_COMMUNITY)
Admission: AD | Admit: 2019-06-23 | Discharge: 2019-06-23 | Disposition: A | Discharge status: Home / Self Care | Payer: Medicaid Other | Attending: Obstetrics & Gynecology | Admitting: Obstetrics & Gynecology

## 2019-06-23 DIAGNOSIS — O26891 Other specified pregnancy related conditions, first trimester: Secondary | ICD-10-CM

## 2019-06-23 DIAGNOSIS — M549 Dorsalgia, unspecified: Secondary | ICD-10-CM

## 2019-06-23 DIAGNOSIS — O26899 Other specified pregnancy related conditions, unspecified trimester: Secondary | ICD-10-CM | POA: Diagnosis present

## 2019-06-23 DIAGNOSIS — O9989 Other specified diseases and conditions complicating pregnancy, childbirth and the puerperium: Secondary | ICD-10-CM

## 2019-06-23 DIAGNOSIS — R1031 Right lower quadrant pain: Secondary | ICD-10-CM | POA: Diagnosis present

## 2019-06-23 DIAGNOSIS — Z3A12 12 weeks gestation of pregnancy: Secondary | ICD-10-CM

## 2019-06-23 DIAGNOSIS — R102 Pelvic and perineal pain: Secondary | ICD-10-CM | POA: Insufficient documentation

## 2019-06-23 DIAGNOSIS — O99891 Other specified diseases and conditions complicating pregnancy: Secondary | ICD-10-CM

## 2019-06-23 LAB — URINALYSIS, ROUTINE W REFLEX MICROSCOPIC
Bilirubin Urine: NEGATIVE
Glucose, UA: NEGATIVE mg/dL
Hgb urine dipstick: NEGATIVE
Ketones, ur: NEGATIVE mg/dL
Leukocytes,Ua: NEGATIVE
Nitrite: NEGATIVE
Protein, ur: NEGATIVE mg/dL
Specific Gravity, Urine: 1.023 (ref 1.005–1.030)
pH: 6 (ref 5.0–8.0)

## 2019-06-23 MED ORDER — CYCLOBENZAPRINE HCL 10 MG PO TABS: 10.0000 mg | ORAL_TABLET | Freq: Two times a day (BID) | ORAL | 0 refills | Status: DC | PRN

## 2019-06-23 MED ORDER — CYCLOBENZAPRINE HCL 10 MG PO TABS
10.0000 mg | ORAL_TABLET | Freq: Once | ORAL | Status: AC
  Administered 2019-06-23: 10 mg via ORAL
  Filled 2019-06-23: qty 1

## 2019-06-23 NOTE — MAU Provider Note (Signed)
History     CSN: [id number]  Arrival date and time: 06/23/19 1654   First Provider Initiated Contact with Patient 06/23/19 1737      Chief Complaint  Patient presents with  . Abdominal Pain  . Back Pain   HPI  Ms.  Jasmin Ellis is a 28 y.o. year old [id number] female at [redacted]w[redacted]d weeks gestation who presents to MAU reporting lower back and RLQ abdominal pain since yesterday 06/22/19. She was seen in MAU on 05/11/2019 for the same complaints. She reports taking Tylenol today at noon with no relief. She denies contractions, VB or abnormal vaginal discharge. She denies any recent SI. She states her last SI was before her last visit to MAU on 05/11/2019 d/t her husband travels for work.  Past Medical History:  Diagnosis Date  . Anemia   . Anxiety   . Back pain, chronic    s/p fall 8 years ago  . Diabetes mellitus without complication ([place])   . Fibroids     Past Surgical History:  Procedure Laterality Date  . NO PAST SURGERIES      Family History  Problem Relation Age of Onset  . Diabetes Father     Social History   Tobacco Use  . Smoking status: Never Smoker  . Smokeless tobacco: Never Used  Substance Use Topics  . Alcohol use: No  . Drug use: No    Allergies: No Known Allergies  Medications Prior to Admission  Medication Sig Dispense Refill Last Dose  . hydrOXYzine (ATARAX/VISTARIL) 10 MG tablet Take 1 tablet (10 mg total) by mouth 3 (three) times daily as needed for anxiety. 30 tablet 1   . Prenatal Vit-Fe Fumarate-FA (MULTIVITAMIN-PRENATAL) 27-0.8 MG TABS tablet Take 1 tablet by mouth daily at 12 noon.     . sertraline (ZOLOFT) 25 MG tablet Take 1 tablet (25 mg total) by mouth daily. 90 tablet 0     Review of Systems  Constitutional: Negative.   HENT: Negative.   Eyes: Negative.   Respiratory: Negative.   Cardiovascular: Negative.   Gastrointestinal: Negative.   Endocrine: Negative.   Genitourinary: Positive for pelvic pain.  Musculoskeletal: Positive for  back pain (lower).  Skin: Negative.   Allergic/Immunologic: Negative.   Neurological: Negative.   Hematological: Negative.   Psychiatric/Behavioral: Negative.    Physical Exam   Blood pressure 131/69, pulse 82, temperature 99.4 F (37.4 C), temperature source Oral, resp. rate 18, weight 89 kg, last menstrual period 03/27/2019, SpO2 100 %.  Physical Exam  Nursing note and vitals reviewed. Constitutional: She is oriented to person, place, and time. She appears well-developed and well-nourished.  HENT:  Head: Normocephalic and atraumatic.  Eyes: Pupils are equal, round, and reactive to light.  Neck: Normal range of motion.  Cardiovascular: Normal rate and regular rhythm.  Respiratory: Effort normal and breath sounds normal.  GI: Soft. Bowel sounds are normal. There is abdominal tenderness (RLQ).  Genitourinary:    Genitourinary Comments: Offered vaginal swabs -- pt declined   Musculoskeletal: Normal range of motion.  Neurological: She is alert and oriented to person, place, and time. She has normal reflexes.  Skin: Skin is warm and dry.  Psychiatric: She has a normal mood and affect. Her behavior is normal. Judgment and thought content normal.   FHTs by doppler: 159 bpm    MAU Course  Procedures  MDM CCUA Flexeril 10 mg po -- resolved pain "a little" pain rated 6/10 from 8/10  Results for orders placed or performed during  the hospital encounter of 06/23/19 (from the past 24 hour(s))  Urinalysis, Routine w reflex microscopic     Status: Abnormal   Collection Time: 06/23/19  5:26 PM  Result Value Ref Range   Color, Urine YELLOW YELLOW   APPearance HAZY (A) CLEAR   Specific Gravity, Urine 1.023 1.005 - 1.030   pH 6.0 5.0 - 8.0   Glucose, UA NEGATIVE NEGATIVE mg/dL   Hgb urine dipstick NEGATIVE NEGATIVE   Bilirubin Urine NEGATIVE NEGATIVE   Ketones, ur NEGATIVE NEGATIVE mg/dL   Protein, ur NEGATIVE NEGATIVE mg/dL   Nitrite NEGATIVE NEGATIVE   Leukocytes,Ua NEGATIVE  NEGATIVE    Assessment and Plan  Back pain affecting pregnancy in first trimester  - Information provided on back pain in pregnancy - Rx for Flexeril 10 mg BID prn pain  Pelvic pain affecting pregnancy in first trimester, antepartum   - Information provided on RLP - Advised that this is normal variance of pregnancy - Comfort measures to relieve pain discussed   - Discharge home - Call [place] office to care transferred  msg sent to [facility] - Patient verbalized an understanding of the plan of care and agrees.     Laury Deep, MSN, CNM 06/23/2019, 5:37 PM

## 2019-06-23 NOTE — MAU Note (Signed)
Jasmin Ellis is a 28 y.o. at [redacted]w[redacted]d here in MAU reporting: having back and abdominal pain since yesterday. Took some tylenol today and it did not help. No bleeding or discharge.  Onset of complaint: yesterday  Pain score: abdominal pain 8/10, back 8/10  Vitals:   06/23/19 1708  BP: 132/82  Pulse: 89  Resp: 18  Temp: 99.4 F (37.4 C)  SpO2: 100%      Lab orders placed from triage: UA

## 2019-07-03 ENCOUNTER — Other Ambulatory Visit: Payer: Self-pay | Admitting: Lactation Services

## 2019-07-03 ENCOUNTER — Other Ambulatory Visit: Payer: Self-pay

## 2019-07-03 ENCOUNTER — Ambulatory Visit (INDEPENDENT_AMBULATORY_CARE_PROVIDER_SITE_OTHER): Payer: Medicaid Other | Admitting: Obstetrics & Gynecology

## 2019-07-03 VITALS — BP 136/82 | HR 86 | Wt 196.6 lb

## 2019-07-03 DIAGNOSIS — O24112 Pre-existing diabetes mellitus, type 2, in pregnancy, second trimester: Secondary | ICD-10-CM

## 2019-07-03 DIAGNOSIS — O99212 Obesity complicating pregnancy, second trimester: Secondary | ICD-10-CM | POA: Diagnosis not present

## 2019-07-03 DIAGNOSIS — O09892 Supervision of other high risk pregnancies, second trimester: Secondary | ICD-10-CM | POA: Diagnosis not present

## 2019-07-03 DIAGNOSIS — Z3A14 14 weeks gestation of pregnancy: Secondary | ICD-10-CM | POA: Diagnosis not present

## 2019-07-03 DIAGNOSIS — O99210 Obesity complicating pregnancy, unspecified trimester: Secondary | ICD-10-CM | POA: Insufficient documentation

## 2019-07-03 HISTORY — DX: Obesity complicating pregnancy, unspecified trimester: O99.210

## 2019-07-03 MED ORDER — ACCU-CHEK FASTCLIX LANCETS MISC: 1.0000 | Freq: Four times a day (QID) | 12 refills | Status: DC

## 2019-07-03 MED ORDER — ACCU-CHEK GUIDE W/DEVICE KIT: 1.0000 | PACK | Freq: Once | 0 refills | Status: AC

## 2019-07-03 MED ORDER — PRENATAL 27-0.8 MG PO TABS: 1.0000 | ORAL_TABLET | Freq: Every day | ORAL | 12 refills | Status: DC

## 2019-07-03 MED ORDER — ASPIRIN 81 MG PO CHEW: 81.0000 mg | CHEWABLE_TABLET | Freq: Every day | ORAL | 6 refills | Status: DC

## 2019-07-03 MED ORDER — ACCU-CHEK GUIDE VI STRP: ORAL_STRIP | 12 refills | Status: DC

## 2019-07-03 NOTE — Progress Notes (Signed)
   PRENATAL VISIT NOTE  Subjective:  Jasmin Ellis is a 28 y.o. married G4P3003 at [redacted]w[redacted]d being seen today for ongoing prenatal care.  She is currently monitored for the following issues for this high-risk pregnancy and has Back pain, chronic; Supervision of other high risk pregnancies, second trimester; Diabetes mellitus in pregnancy; Dizziness; Fatigue; Insomnia; Anxiety with depression; Dyspepsia; Tongue burning sensation; First trimester pregnancy; Prenatal care in first trimester; Back pain affecting pregnancy; Pelvic pain affecting pregnancy; and Obesity in pregnancy on their problem list.  Patient reports no complaints.  Contractions: Not present. Vag. Bleeding: None.   . Denies leaking of fluid.   The following portions of the patient's history were reviewed and updated as appropriate: allergies, current medications, past family history, past medical history, past social history, past surgical history and problem list.   Objective:   Vitals:   07/03/19 1357  BP: 136/82  Pulse: 86  Weight: 196 lb 9.6 oz (89.2 kg)    Fetal Status: Fetal Heart Rate (bpm): 150         General:  Alert, oriented and cooperative. Patient is in no acute distress.  Skin: Skin is warm and dry. No rash noted.   Cardiovascular: Normal heart rate noted  Respiratory: Normal respiratory effort, no problems with respiration noted  Abdomen: Soft, gravid, appropriate for gestational age.  Pain/Pressure: Absent     Pelvic: Cervical exam deferred        Extremities: Normal range of motion.  Edema: None  Mental Status: Normal mood and affect. Normal behavior. Normal judgment and thought content.   Assessment and Plan:  Pregnancy: G4P3003 at [redacted]w[redacted]d 1. Pre-existing type 2 diabetes mellitus during pregnancy in second trimester - rec baby asa - [place] MFM OB DETAIL +14 WK; Future - Referral to Nutrition and Diabetes Services - She was advised to check her sugars 4 times per day. She does not currently check her  sugars. I tried to impress on her how important that it is to check, discussed risks.  2. Supervision of other high risk pregnancies, second trimester - Flu vaccine today - [place] MFM OB DETAIL +14 WK; Future  3. Obesity in pregnancy   Preterm labor symptoms and general obstetric precautions including but not limited to vaginal bleeding, contractions, leaking of fluid and fetal movement were reviewed in detail with the patient. Please refer to After Visit Summary for other counseling recommendations.   No follow-ups on file.  Future Appointments  Date Time Provider [place]  07/09/2019  1:30 PM Nuala Alpha, DO [facility]    Emily Filbert, MD

## 2019-07-03 NOTE — Progress Notes (Signed)
Home medicaid form completed.

## 2019-07-03 NOTE — Progress Notes (Signed)
Ordered Glucometer and supplies per Dr. Hulan Fray. Pt has checked blood sugars in the past. She is aware she can ask the Pharmacist to show her how to use her Glucometer when she picks it up.

## 2019-07-04 LAB — COMPREHENSIVE METABOLIC PANEL
ALT: 17 IU/L (ref 0–32)
AST: 20 IU/L (ref 0–40)
Albumin/Globulin Ratio: 1.3 (ref 1.2–2.2)
Albumin: 3.8 g/dL — ABNORMAL LOW (ref 3.9–5.0)
Alkaline Phosphatase: 127 IU/L — ABNORMAL HIGH (ref 39–117)
BUN/Creatinine Ratio: 8 — ABNORMAL LOW (ref 9–23)
BUN: 4 mg/dL — ABNORMAL LOW (ref 6–20)
Bilirubin Total: 0.2 mg/dL (ref 0.0–1.2)
CO2: 23 mmol/L (ref 20–29)
Calcium: 8.8 mg/dL (ref 8.7–10.2)
Chloride: 103 mmol/L (ref 96–106)
Creatinine, Ser: 0.52 mg/dL — ABNORMAL LOW (ref 0.57–1.00)
GFR calc Af Amer: 148 mL/min/{1.73_m2} (ref >59)
GFR calc non Af Amer: 129 mL/min/{1.73_m2} (ref >59)
Globulin, Total: 3 g/dL (ref 1.5–4.5)
Glucose: 73 mg/dL (ref 65–99)
Potassium: 4 mmol/L (ref 3.5–5.2)
Sodium: 142 mmol/L (ref 134–144)
Total Protein: 6.8 g/dL (ref 6.0–8.5)

## 2019-07-04 LAB — PROTEIN / CREATININE RATIO, URINE
Creatinine, Urine: 193.3 mg/dL
Protein, Ur: 19.2 mg/dL
Protein/Creat Ratio: 99 mg/g creat (ref 0–200)

## 2019-07-04 LAB — TSH: TSH: 0.512 u[IU]/mL (ref 0.450–4.500)

## 2019-07-08 ENCOUNTER — Telehealth: Payer: Self-pay

## 2019-07-08 NOTE — Telephone Encounter (Signed)
Pt called stating she missed a call on Friday. Requesting a call back.

## 2019-07-09 ENCOUNTER — Telehealth: Payer: Self-pay | Admitting: Obstetrics & Gynecology

## 2019-07-09 ENCOUNTER — Encounter: Payer: Medicaid Other | Admitting: Family Medicine

## 2019-07-09 NOTE — Telephone Encounter (Signed)
Attempted to contact patient about her appointment on 9/23 @ 10:55. No answer and unable to leave a message due to the voicemail not being set up.

## 2019-07-10 ENCOUNTER — Ambulatory Visit (INDEPENDENT_AMBULATORY_CARE_PROVIDER_SITE_OTHER): Payer: Medicaid Other | Admitting: Obstetrics & Gynecology

## 2019-07-10 ENCOUNTER — Other Ambulatory Visit: Payer: Self-pay

## 2019-07-10 ENCOUNTER — Other Ambulatory Visit (HOSPITAL_COMMUNITY): Payer: Self-pay

## 2019-07-10 VITALS — BP 126/65 | HR 82 | Temp 98.1°F | Wt 197.2 lb

## 2019-07-10 DIAGNOSIS — O24112 Pre-existing diabetes mellitus, type 2, in pregnancy, second trimester: Secondary | ICD-10-CM

## 2019-07-10 DIAGNOSIS — O09892 Supervision of other high risk pregnancies, second trimester: Secondary | ICD-10-CM

## 2019-07-10 DIAGNOSIS — Z3A15 15 weeks gestation of pregnancy: Secondary | ICD-10-CM

## 2019-07-10 NOTE — Telephone Encounter (Signed)
Patient seen in the office today for ob fu. Shantil Vallejo,RN

## 2019-07-10 NOTE — Progress Notes (Signed)
   PRENATAL VISIT NOTE  Subjective:  Jasmin Ellis is a 28 y.o. G4P3003 at [redacted]w[redacted]d being seen today for ongoing prenatal care.  She is currently monitored for the following issues for this high-risk pregnancy and has Back pain, chronic; Supervision of other high risk pregnancies, second trimester; Diabetes mellitus in pregnancy; Dizziness; Fatigue; Insomnia; Anxiety with depression; Dyspepsia; Tongue burning sensation; First trimester pregnancy; Prenatal care in first trimester; Back pain affecting pregnancy; Pelvic pain affecting pregnancy; and Obesity in pregnancy on their problem list.  Patient reports mild abdominal pain.  Contractions: Not present. Vag. Bleeding: None.  Movement: Absent. Denies leaking of fluid.   The following portions of the patient's history were reviewed and updated as appropriate: allergies, current medications, past family history, past medical history, past social history, past surgical history and problem list.   Objective:   Vitals:   07/10/19 1105  BP: 126/65  Pulse: 82  Temp: 98.1 F (36.7 C)  Weight: 197 lb 3.2 oz (89.4 kg)    Fetal Status: Fetal Heart Rate (bpm): 152   Movement: Absent     General:  Alert, oriented and cooperative. Patient is in no acute distress.  Skin: Skin is warm and dry. No rash noted.   Cardiovascular: Normal heart rate noted  Respiratory: Normal respiratory effort, no problems with respiration noted  Abdomen: Soft, gravid, appropriate for gestational age.  Pain/Pressure: Present     Pelvic: Cervical exam deferred        Extremities: Normal range of motion.  Edema: None  Mental Status: Normal mood and affect. Normal behavior. Normal judgment and thought content.   Assessment and Plan:  Pregnancy: G4P3003 at [redacted]w[redacted]d 1. Pre-existing type 2 diabetes mellitus during pregnancy in second trimester Only has one CBG from this morning as she picked up her meter yesterday, was 103 this morning. Will likely need medication but need more  values.  Will reevaluate virtually in one week, may need ot start Metformin vs insulin then.  Last A1C was 6.0. - US Fetal Echocardiography; Future - Ambulatory referral to Ophthalmology  2. Supervision of other high risk pregnancies, second trimester Prenatal labs reviewed with patient. Anatomy scan scheduled. Declined genetic screening. Told to let us know if abdominal pain worsens. Also had a long discussion about taking Zoloft in pregnancy; rare risk of PPHN and benefits of controlling symptoms reviewed.  No other complaints or concerns.  Routine obstetric precautions reviewed.  Please refer to After Visit Summary for other counseling recommendations.   Return in about 1 week (around 07/17/2019) for  Virtual OB Visit for review of BS, ?prescribe meds   4 weeks: OFFICE HOB (same day as u/s).  Future Appointments  Date Time Provider [place]  07/18/2019  1:35 PM Woodroe Mode, MD [place] Junia Nygren, MD

## 2019-07-10 NOTE — Patient Instructions (Signed)
Return to office for any scheduled appointments. Call the office or go to the MAU at [facility] [place] if:  You begin to have strong, frequent contractions  Your water breaks.  Sometimes it is a big gush of fluid, sometimes it is just a trickle that keeps getting your panties wet or running down your legs  You have vaginal bleeding.  It is normal to have a small amount of spotting if your cervix was checked.   You do not feel your baby moving like normal.  If you do not, get something to eat and drink and lay down and focus on feeling your baby move.   If your baby is still not moving like normal, you should call the office or go to MAU.  Any other obstetric concerns.   

## 2019-07-18 ENCOUNTER — Other Ambulatory Visit: Payer: Self-pay

## 2019-07-18 ENCOUNTER — Telehealth (INDEPENDENT_AMBULATORY_CARE_PROVIDER_SITE_OTHER): Payer: Medicaid Other | Admitting: Obstetrics & Gynecology

## 2019-07-18 DIAGNOSIS — O09892 Supervision of other high risk pregnancies, second trimester: Secondary | ICD-10-CM

## 2019-07-18 DIAGNOSIS — Z3A16 16 weeks gestation of pregnancy: Secondary | ICD-10-CM

## 2019-07-18 DIAGNOSIS — O24112 Pre-existing diabetes mellitus, type 2, in pregnancy, second trimester: Secondary | ICD-10-CM

## 2019-07-18 MED ORDER — BLOOD PRESSURE MONITOR KIT: 1.0000 | PACK | Freq: Every day | 0 refills | Status: DC

## 2019-07-18 NOTE — Progress Notes (Signed)
   [place] VIRTUAL VIDEO VISIT ENCOUNTER NOTE  Provider location: [facility] at [place]   I connected with [name] on 07/18/19 at  1:35 PM EDT by My Chart Video Encounter at home and verified that I am speaking with the correct person using two identifiers.   I discussed the limitations, risks, security and privacy concerns of performing an evaluation and management service virtually and the availability of in person appointments. I also discussed with the patient that there may be a patient responsible charge related to this service. The patient expressed understanding and agreed to proceed. Subjective:  Jasmin Ellis is a 28 y.o. [id number] at [redacted]w[redacted]d being seen today for ongoing prenatal care.  She is currently monitored for the following issues for this high-risk pregnancy and has Back pain, chronic; Supervision of other high risk pregnancies, second trimester; Diabetes mellitus in pregnancy; Dizziness; Fatigue; Insomnia; Anxiety with depression; Dyspepsia; Tongue burning sensation; First trimester pregnancy; Prenatal care in first trimester; Back pain affecting pregnancy; Pelvic pain affecting pregnancy; and Obesity in pregnancy on their problem list.  Patient reports no complaints.  Contractions: Not present. Vag. Bleeding: None.  Movement: Absent. Denies any leaking of fluid.   The following portions of the patient's history were reviewed and updated as appropriate: allergies, current medications, past family history, past medical history, past social history, past surgical history and problem list.   Objective:  There were no vitals filed for this visit.  Fetal Status:     Movement: Absent     General:  Alert, oriented and cooperative. Patient is in no acute distress.  Respiratory: Normal respiratory effort, no problems with respiration noted  Mental Status: Normal mood and affect. Normal behavior. Normal judgment and thought content.  Rest  of physical exam deferred due to type of encounter  Imaging: No results found.  Assessment and Plan:  Pregnancy: G4P3003 at [redacted]w[redacted]d 1. Supervision of other high risk pregnancies, second trimester The video call was disconnected 3 times.  RN will call patient and have CBGs written down for me to review.  I contacted Babyscripts and they do not have her on the DM platform.  RN will also enroll her for Babyscripts so that we can better monitor her CBGs.  If we are unsuccessful to connect with her virtually, we will need to have her come to the office.  She does speak good [place].   Anatomy US is scheduled.  Preterm labor symptoms and general obstetric precautions including but not limited to vaginal bleeding, contractions, leaking of fluid and fetal movement were reviewed in detail with the patient. I discussed the assessment and treatment plan with the patient. The patient was provided an opportunity to ask questions and all were answered. The patient agreed with the plan and demonstrated an understanding of the instructions. The patient was advised to call back or seek an in-person office evaluation/go to MAU at [facility] for any urgent or concerning symptoms. Please refer to After Visit Summary for other counseling recommendations.    Future Appointments  Date Time Provider [place]  08/07/2019  1:00 PM [place] MFC-US  08/07/2019  1:00 PM [place] 3 WH-MFCUS MFC-US  08/07/2019  2:15 PM Sloan Leiter, MD [place], MD [facility] [place] [facility]

## 2019-07-22 ENCOUNTER — Telehealth: Payer: Self-pay | Admitting: *Deleted

## 2019-07-22 NOTE — Telephone Encounter (Signed)
-----   Message from Guss Bunde, MD sent at 07/19/2019 10:52 AM EDT ----- Our video visit was interrupted yesterday twice and we did not complete.  Can someone please call the patient and write down her CBGs for the past week (or have her take a picture and email to [place]).  She is supposed to be on babyscripts.  Loma Sousa said she could not find her on BRx.  I am emailing babyscripts to look into her chart.

## 2019-07-22 NOTE — Telephone Encounter (Addendum)
I called [place] home number and heard voicemail not set up. I called her mobile number and heard call cannot be completed at this time.  Will send Mychart message. Do not see patient in Babyscripts or order. Also do not see order for referral to Bev for diabetes refresher and to set up babyscripts diabetes. Will send message to patient re sign up for  Babyscripst we need her email and meeting with Bev. Will need to call patient back if we do not hear from her today Chaia Ikard,RN

## 2019-07-25 ENCOUNTER — Ambulatory Visit: Payer: Medicaid Other | Admitting: *Deleted

## 2019-07-25 ENCOUNTER — Other Ambulatory Visit: Payer: Self-pay | Admitting: Lactation Services

## 2019-07-25 ENCOUNTER — Encounter: Payer: Medicaid Other | Attending: Obstetrics and Gynecology | Admitting: *Deleted

## 2019-07-25 ENCOUNTER — Other Ambulatory Visit: Payer: Self-pay

## 2019-07-25 ENCOUNTER — Encounter: Payer: Self-pay | Admitting: *Deleted

## 2019-07-25 DIAGNOSIS — O24112 Pre-existing diabetes mellitus, type 2, in pregnancy, second trimester: Secondary | ICD-10-CM | POA: Diagnosis not present

## 2019-07-25 DIAGNOSIS — Z3A00 Weeks of gestation of pregnancy not specified: Secondary | ICD-10-CM | POA: Diagnosis not present

## 2019-07-25 MED ORDER — INSULIN NPH (HUMAN) (ISOPHANE) 100 UNIT/ML ~~LOC~~ SUSP: 5.0000 [IU] | Freq: Two times a day (BID) | SUBCUTANEOUS | 3 refills | Status: DC

## 2019-07-25 MED ORDER — "INSULIN SYRINGE-NEEDLE U-100 30G X 15/64"" 0.5 ML MISC": 1.0000 | 6 refills | Status: DC | PRN

## 2019-07-25 NOTE — Progress Notes (Signed)
Patient was seen on 07/25/2019 for type 2 Diabetes and pregnancy self-management. EDD 01/01/2020.  Patient states history of this diabetes for several years. She was on Metformin until 1 year ago when it was stopped due to her good control. She is not on any diabetes medication now.  Diet history obtained. Patient eats good variety of all food groups and beverages include water and tea with milk.  She states no training on Carbohydrate Counting. The following learning objectives were met by the patient :   States the definition of type 2 Diabetes and pregnancy   States why dietary management is important in controlling blood glucose  Describes the effects of carbohydrates on blood glucose levels  Demonstrates ability to create a balanced meal plan  Demonstrates carbohydrate counting   States when to check blood glucose levels  Demonstrates proper blood glucose monitoring techniques  States the effect of stress and exercise on blood glucose levels  States the importance of limiting caffeine and abstaining from alcohol and smoking  Plan:   Aim for 3 Carb Choices per meal (45 grams) +/- 1 either way   Aim for 1-2 Carbs per snack  Begin reading food labels for Total Carbohydrate of foods  Consider  increasing your activity level by walking or other activity daily as tolerated  Begin checking BG before breakfast and 2 hours after first bite of breakfast, lunch and dinner as directed by MD   Bring Log Book/Sheet and meter to every medical appointment OR use Baby Scripts (see below)  Patient was introduced to Baby Scripts, but is not appropriate for Baby Scripts due to no e-mail address. She may bring it to next appointment and then could be added.   Take medication as directed by MD  Patient already has a meter: Accu Chek Guide And is testing pre breakfast and 2 hours each meal as directed by MD Review of Log Book shows: elevated FBG and post meals.  I reviewed blood sugars with  Dr. C. Pickens and he ordered that she start NPH insulin at 5 units in AM and 5 units at bedtime.   Insulin Instruction  MD orders are: NPH insulin at 5 units in AM and 5 units at bedtime.  The following learning objectives were met by the patient during this visit:   Insulin Action of NPH insulins  Reviewed syringe & vial including # units per syringe and vial  Hygiene and storage  Drawing up single and mixed doses if using vials   Single dose   Mixed dose (informed pt of mixing if she needs meal time insulin in the future. Did not teach her that technique today.  Rotation of Sites  Hypoglycemia- symptoms, causes, treatment choices  Record keeping and MD follow up  Patient demonstrated understanding of insulin administration by return demonstration.  Patient instructed to monitor glucose levels: FBS: 60 - 95 mg/dl 2 hour: <120 mg/dl  Patient received the following handouts:  Nutrition Diabetes and Pregnancy  Carbohydrate Counting List  Insulin Instruction Handout   Patient will be seen for follow-up in 1 month  

## 2019-07-25 NOTE — Progress Notes (Signed)
Insulin and Syringes ordered per Dr. Ilda Basset and Jeanie Sewer, Diabetes Educator.

## 2019-07-26 NOTE — Telephone Encounter (Signed)
I called [place] mobile and home numbers and for both I heard a message " Your call cannot be completed at this time".  I also called her work number and it rang many times and no answer.  Will send another Mychart message and letter.  Kalisi Bevill,RN

## 2019-07-29 ENCOUNTER — Telehealth: Payer: Self-pay | Admitting: *Deleted

## 2019-07-29 DIAGNOSIS — O09892 Supervision of other high risk pregnancies, second trimester: Secondary | ICD-10-CM

## 2019-07-29 DIAGNOSIS — O24019 Pre-existing diabetes mellitus, type 1, in pregnancy, unspecified trimester: Secondary | ICD-10-CM

## 2019-07-29 MED ORDER — INSULIN NPH (HUMAN) (ISOPHANE) 100 UNIT/ML ~~LOC~~ SUSP: 5.0000 [IU] | Freq: Two times a day (BID) | SUBCUTANEOUS | 3 refills | Status: DC

## 2019-07-29 NOTE — Telephone Encounter (Signed)
Received a fax from [facility] requesting nph insulin be chagned to humulin N or prior auth for Novolin N. Will send in new RX for Humulin N and route to provider. Linda,RN

## 2019-08-07 ENCOUNTER — Ambulatory Visit (INDEPENDENT_AMBULATORY_CARE_PROVIDER_SITE_OTHER): Payer: Medicaid Other | Admitting: Obstetrics and Gynecology

## 2019-08-07 ENCOUNTER — Other Ambulatory Visit (HOSPITAL_COMMUNITY): Payer: Self-pay | Admitting: *Deleted

## 2019-08-07 ENCOUNTER — Other Ambulatory Visit: Payer: Self-pay

## 2019-08-07 ENCOUNTER — Encounter (HOSPITAL_COMMUNITY): Payer: Self-pay

## 2019-08-07 ENCOUNTER — Ambulatory Visit (HOSPITAL_COMMUNITY): Payer: Medicaid Other | Admitting: *Deleted

## 2019-08-07 ENCOUNTER — Ambulatory Visit (HOSPITAL_COMMUNITY)
Admission: RE | Admit: 2019-08-07 | Discharge: 2019-08-07 | Disposition: A | Discharge status: Home / Self Care | Payer: Medicaid Other | Source: Ambulatory Visit | Attending: Obstetrics and Gynecology | Admitting: Obstetrics and Gynecology

## 2019-08-07 ENCOUNTER — Encounter: Payer: Self-pay | Admitting: Obstetrics and Gynecology

## 2019-08-07 VITALS — BP 123/71 | HR 88 | Temp 99.5°F

## 2019-08-07 VITALS — BP 117/89 | HR 92 | Wt 198.4 lb

## 2019-08-07 DIAGNOSIS — Z3A19 19 weeks gestation of pregnancy: Secondary | ICD-10-CM | POA: Diagnosis not present

## 2019-08-07 DIAGNOSIS — O99210 Obesity complicating pregnancy, unspecified trimester: Secondary | ICD-10-CM | POA: Diagnosis present

## 2019-08-07 DIAGNOSIS — O24112 Pre-existing diabetes mellitus, type 2, in pregnancy, second trimester: Secondary | ICD-10-CM

## 2019-08-07 DIAGNOSIS — O99212 Obesity complicating pregnancy, second trimester: Secondary | ICD-10-CM | POA: Diagnosis not present

## 2019-08-07 DIAGNOSIS — O3412 Maternal care for benign tumor of corpus uteri, second trimester: Secondary | ICD-10-CM | POA: Diagnosis not present

## 2019-08-07 DIAGNOSIS — O09892 Supervision of other high risk pregnancies, second trimester: Secondary | ICD-10-CM

## 2019-08-07 DIAGNOSIS — Z794 Long term (current) use of insulin: Secondary | ICD-10-CM | POA: Diagnosis present

## 2019-08-07 DIAGNOSIS — O24119 Pre-existing diabetes mellitus, type 2, in pregnancy, unspecified trimester: Secondary | ICD-10-CM | POA: Insufficient documentation

## 2019-08-07 DIAGNOSIS — Z23 Encounter for immunization: Secondary | ICD-10-CM

## 2019-08-07 DIAGNOSIS — D259 Leiomyoma of uterus, unspecified: Secondary | ICD-10-CM

## 2019-08-07 MED ORDER — ACCU-CHEK GUIDE VI STRP: ORAL_STRIP | 12 refills | Status: DC

## 2019-08-07 NOTE — Progress Notes (Signed)
Fetal Echo scheduled for Dr. Filbert Schilder @ [phone] on 08/29/19 @ 1115.  Pt given information to go to  [facility] to pick up BP cuff.  Pt also notified Fetal Echo via MyChart.

## 2019-08-07 NOTE — Progress Notes (Signed)
   PRENATAL VISIT NOTE  Subjective:  Jasmin Ellis is a 28 y.o. [id number] at [redacted]w[redacted]d being seen today for ongoing prenatal care.  She is currently monitored for the following issues for this high-risk pregnancy and has Back pain, chronic; Supervision of other high risk pregnancies, second trimester; Diabetes mellitus in pregnancy; Dizziness; Fatigue; Insomnia; Anxiety with depression; Dyspepsia; Tongue burning sensation; First trimester pregnancy; Prenatal care in first trimester; Back pain affecting pregnancy; Pelvic pain affecting pregnancy; and Obesity in pregnancy on their problem list.  Patient reports no complaints.  Contractions: Not present. Vag. Bleeding: None.  Movement: Present. Denies leaking of fluid.   The following portions of the patient's history were reviewed and updated as appropriate: allergies, current medications, past family history, past medical history, past social history, past surgical history and problem list.   Objective:   Vitals:   08/07/19 1540  BP: 117/89  Pulse: 92  Weight: 198 lb 6.4 oz (90 kg)    Fetal Status:     Movement: Present     General:  Alert, oriented and cooperative. Patient is in no acute distress.  Skin: Skin is warm and dry. No rash noted.   Cardiovascular: Normal heart rate noted  Respiratory: Normal respiratory effort, no problems with respiration noted  Abdomen: Soft, gravid, appropriate for gestational age.  Pain/Pressure: Absent     Pelvic: Cervical exam deferred        Extremities: Normal range of motion.  Edema: None  Mental Status: Normal mood and affect. Normal behavior. Normal judgment and thought content.   Assessment and Plan:  Pregnancy: G4P3003 at [redacted]w[redacted]d  1. Supervision of other high risk pregnancies, second trimester Flu shot today  2. Pre-existing type 2 diabetes mellitus during pregnancy in second trimester NPH 5 units BID FG: 80-90s PP: all < 120 Cont current regimen Fetal echo scheduled today    Preterm  labor symptoms and general obstetric precautions including but not limited to vaginal bleeding, contractions, leaking of fluid and fetal movement were reviewed in detail with the patient. Please refer to After Visit Summary for other counseling recommendations.   Return in about 2 weeks (around 08/21/2019) for high OB, in person.  Future Appointments  Date Time Provider [place]  08/23/2019  8:35 AM Sloan Leiter, MD [facility] [place]  08/27/2019 10:15 AM [place]  09/04/2019  1:45 PM [place] 2 WH-MFCUS MFC-US  09/04/2019  1:50 PM [place] MFC-US    Sloan Leiter, MD

## 2019-08-23 ENCOUNTER — Ambulatory Visit (INDEPENDENT_AMBULATORY_CARE_PROVIDER_SITE_OTHER): Payer: Medicaid Other | Admitting: Obstetrics and Gynecology

## 2019-08-23 ENCOUNTER — Encounter: Payer: Self-pay | Admitting: Obstetrics and Gynecology

## 2019-08-23 ENCOUNTER — Other Ambulatory Visit: Payer: Self-pay

## 2019-08-23 VITALS — BP 133/82 | HR 102 | Temp 97.5°F | Wt 201.0 lb

## 2019-08-23 DIAGNOSIS — Z3A21 21 weeks gestation of pregnancy: Secondary | ICD-10-CM

## 2019-08-23 DIAGNOSIS — O24112 Pre-existing diabetes mellitus, type 2, in pregnancy, second trimester: Secondary | ICD-10-CM

## 2019-08-23 DIAGNOSIS — O09892 Supervision of other high risk pregnancies, second trimester: Secondary | ICD-10-CM

## 2019-08-23 NOTE — Progress Notes (Signed)
   PRENATAL VISIT NOTE  Subjective:  Jasmin Ellis is a 28 y.o. G4P3003 at [redacted]w[redacted]d being seen today for ongoing prenatal care.  She is currently monitored for the following issues for this high-risk pregnancy and has Back pain, chronic; Supervision of other high risk pregnancies, second trimester; Diabetes mellitus in pregnancy; Dizziness; Fatigue; Insomnia; Anxiety with depression; Dyspepsia; Tongue burning sensation; First trimester pregnancy; Prenatal care in first trimester; Back pain affecting pregnancy; Pelvic pain affecting pregnancy; and Obesity in pregnancy on their problem list.  Patient reports no complaints.  Contractions: Not present. Vag. Bleeding: None.  Movement: Present. Denies leaking of fluid.   The following portions of the patient's history were reviewed and updated as appropriate: allergies, current medications, past family history, past medical history, past social history, past surgical history and problem list.   Objective:   Vitals:   08/23/19 0846  BP: 133/82  Pulse: (!) 102  Temp: (!) 97.5 F (36.4 C)  Weight: 201 lb (91.2 kg)   Fetal Status: Fetal Heart Rate (bpm): 156   Movement: Present     General:  Alert, oriented and cooperative. Patient is in no acute distress.  Skin: Skin is warm and dry. No rash noted.   Cardiovascular: Normal heart rate noted  Respiratory: Normal respiratory effort, no problems with respiration noted  Abdomen: Soft, gravid, appropriate for gestational age.  Pain/Pressure: Absent     Pelvic: Cervical exam deferred        Extremities: Normal range of motion.  Edema: None  Mental Status: Normal mood and affect. Normal behavior. Normal judgment and thought content.   Assessment and Plan:  Pregnancy: G4P3003 at [redacted]w[redacted]d  1. Supervision of other high risk pregnancies, second trimester Doing well  2. Pre-existing type 2 diabetes mellitus during pregnancy in second trimester NPH 5 units BID FG: 80s PP: 120-125 Cont current insulin  regimen Cont baby ASA Has fetal echo and eye appointment scheduled  Preterm labor symptoms and general obstetric precautions including but not limited to vaginal bleeding, contractions, leaking of fluid and fetal movement were reviewed in detail with the patient. Please refer to After Visit Summary for other counseling recommendations.   Return in about 3 weeks (around 09/13/2019) for high OB, in person.  Future Appointments  Date Time Provider [place]  08/27/2019 10:15 AM [place]  09/04/2019  1:45 PM [place] 2 WH-MFCUS MFC-US  09/04/2019  1:50 PM [place] MFC-US    Sloan Leiter, MD

## 2019-08-23 NOTE — Progress Notes (Signed)
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 She checked levels this am & it was 90.

## 2019-08-27 ENCOUNTER — Other Ambulatory Visit: Payer: Medicaid Other

## 2019-09-04 ENCOUNTER — Encounter (HOSPITAL_COMMUNITY): Payer: Self-pay

## 2019-09-04 ENCOUNTER — Ambulatory Visit (HOSPITAL_COMMUNITY): Payer: Medicaid Other | Admitting: *Deleted

## 2019-09-04 ENCOUNTER — Other Ambulatory Visit (HOSPITAL_COMMUNITY): Payer: Self-pay | Admitting: *Deleted

## 2019-09-04 ENCOUNTER — Ambulatory Visit (HOSPITAL_COMMUNITY)
Admission: RE | Admit: 2019-09-04 | Discharge: 2019-09-04 | Disposition: A | Discharge status: Home / Self Care | Payer: Medicaid Other | Source: Ambulatory Visit | Attending: Obstetrics and Gynecology | Admitting: Obstetrics and Gynecology

## 2019-09-04 ENCOUNTER — Other Ambulatory Visit: Payer: Self-pay

## 2019-09-04 DIAGNOSIS — O3412 Maternal care for benign tumor of corpus uteri, second trimester: Secondary | ICD-10-CM

## 2019-09-04 DIAGNOSIS — O99212 Obesity complicating pregnancy, second trimester: Secondary | ICD-10-CM | POA: Diagnosis not present

## 2019-09-04 DIAGNOSIS — Z362 Encounter for other antenatal screening follow-up: Secondary | ICD-10-CM | POA: Diagnosis not present

## 2019-09-04 DIAGNOSIS — D259 Leiomyoma of uterus, unspecified: Secondary | ICD-10-CM

## 2019-09-04 DIAGNOSIS — O99210 Obesity complicating pregnancy, unspecified trimester: Secondary | ICD-10-CM

## 2019-09-04 DIAGNOSIS — O24112 Pre-existing diabetes mellitus, type 2, in pregnancy, second trimester: Secondary | ICD-10-CM

## 2019-09-04 DIAGNOSIS — O24119 Pre-existing diabetes mellitus, type 2, in pregnancy, unspecified trimester: Secondary | ICD-10-CM | POA: Diagnosis not present

## 2019-09-04 DIAGNOSIS — Z3A23 23 weeks gestation of pregnancy: Secondary | ICD-10-CM

## 2019-09-16 ENCOUNTER — Other Ambulatory Visit: Payer: Self-pay

## 2019-09-16 ENCOUNTER — Ambulatory Visit (INDEPENDENT_AMBULATORY_CARE_PROVIDER_SITE_OTHER): Payer: Medicaid Other | Admitting: Family Medicine

## 2019-09-16 VITALS — BP 139/63 | HR 84 | Temp 97.6°F | Wt 204.0 lb

## 2019-09-16 DIAGNOSIS — O24012 Pre-existing diabetes mellitus, type 1, in pregnancy, second trimester: Secondary | ICD-10-CM

## 2019-09-16 DIAGNOSIS — O24112 Pre-existing diabetes mellitus, type 2, in pregnancy, second trimester: Secondary | ICD-10-CM

## 2019-09-16 DIAGNOSIS — O24019 Pre-existing diabetes mellitus, type 1, in pregnancy, unspecified trimester: Secondary | ICD-10-CM

## 2019-09-16 DIAGNOSIS — O99210 Obesity complicating pregnancy, unspecified trimester: Secondary | ICD-10-CM

## 2019-09-16 DIAGNOSIS — O99212 Obesity complicating pregnancy, second trimester: Secondary | ICD-10-CM

## 2019-09-16 DIAGNOSIS — O09892 Supervision of other high risk pregnancies, second trimester: Secondary | ICD-10-CM

## 2019-09-16 DIAGNOSIS — Z3A24 24 weeks gestation of pregnancy: Secondary | ICD-10-CM

## 2019-09-16 DIAGNOSIS — Q210 Ventricular septal defect: Secondary | ICD-10-CM

## 2019-09-16 MED ORDER — INSULIN NPH (HUMAN) (ISOPHANE) 100 UNIT/ML ~~LOC~~ SUSP: 8.0000 [IU] | Freq: Two times a day (BID) | SUBCUTANEOUS | 3 refills | Status: DC

## 2019-09-16 NOTE — Progress Notes (Signed)
Subjective:  Jasmin Ellis is a 28 y.o. G4P3003 at [redacted]w[redacted]d being seen today for ongoing prenatal care.  She is currently monitored for the following issues for this high-risk pregnancy and has Back pain, chronic; Supervision of other high risk pregnancies, second trimester; Diabetes mellitus in pregnancy; Dizziness; Fatigue; Insomnia; Anxiety with depression; Dyspepsia; Tongue burning sensation; First trimester pregnancy; Prenatal care in first trimester; Back pain affecting pregnancy; Pelvic pain affecting pregnancy; and Obesity in pregnancy on their problem list.  GDM: Patient taking insulin NPH 5 units BID.  Reports no hypoglycemic episodes.  Tolerating medication well Fasting: 95-105 2hr PP: after breakfast and lunch controlled. 120-140 after dinner  Patient reports no complaints.  Contractions: Not present. Vag. Bleeding: None.  Movement: Present. Denies leaking of fluid.   The following portions of the patient's history were reviewed and updated as appropriate: allergies, current medications, past family history, past medical history, past social history, past surgical history and problem list. Problem list updated.  Objective:   Vitals:   09/16/19 1015  BP: 139/63  Pulse: 84  Temp: 97.6 F (36.4 C)  Weight: 204 lb (92.5 kg)    Fetal Status: Fetal Heart Rate (bpm): 158   Movement: Present     General:  Alert, oriented and cooperative. Patient is in no acute distress.  Skin: Skin is warm and dry. No rash noted.   Cardiovascular: Normal heart rate noted  Respiratory: Normal respiratory effort, no problems with respiration noted  Abdomen: Soft, gravid, appropriate for gestational age. Pain/Pressure: Absent     Pelvic: Vag. Bleeding: None     Cervical exam deferred        Extremities: Normal range of motion.  Edema: None  Mental Status: Normal mood and affect. Normal behavior. Normal judgment and thought content.   Urinalysis:      Assessment and Plan:  Pregnancy: G4P3003 at  [redacted]w[redacted]d  1. Supervision of other high risk pregnancies, second trimester FHT and FH normal - insulin NPH Human (HUMULIN N) 100 UNIT/ML injection; Inject 0.08 mLs (8 Units total) into the skin 2 (two) times daily before a meal. Inject before breakfast and before supper  Dispense: 10 mL; Refill: 3  2. Pre-existing type 2 diabetes mellitus during pregnancy in second trimester Increase NPH to 8 units bid Continue Growth [place]. May need to add SA insulin before dinner.  3. Obesity in pregnancy  5. Perimebranous VSD Needs F/u echo - office called by nurse and they will contact her to schedule this.  Preterm labor symptoms and general obstetric precautions including but not limited to vaginal bleeding, contractions, leaking of fluid and fetal movement were reviewed in detail with the patient. Please refer to After Visit Summary for other counseling recommendations.  No follow-ups on file.   Truett Mainland, DO

## 2019-10-03 ENCOUNTER — Encounter: Payer: Self-pay | Admitting: *Deleted

## 2019-10-07 ENCOUNTER — Ambulatory Visit (INDEPENDENT_AMBULATORY_CARE_PROVIDER_SITE_OTHER): Payer: Medicaid Other | Admitting: Obstetrics and Gynecology

## 2019-10-07 ENCOUNTER — Encounter: Payer: Self-pay | Admitting: Obstetrics and Gynecology

## 2019-10-07 ENCOUNTER — Other Ambulatory Visit: Payer: Self-pay

## 2019-10-07 VITALS — BP 116/60 | HR 93 | Wt 207.1 lb

## 2019-10-07 DIAGNOSIS — O09892 Supervision of other high risk pregnancies, second trimester: Secondary | ICD-10-CM

## 2019-10-07 DIAGNOSIS — F418 Other specified anxiety disorders: Secondary | ICD-10-CM | POA: Diagnosis not present

## 2019-10-07 DIAGNOSIS — O24012 Pre-existing diabetes mellitus, type 1, in pregnancy, second trimester: Secondary | ICD-10-CM | POA: Diagnosis not present

## 2019-10-07 DIAGNOSIS — Z23 Encounter for immunization: Secondary | ICD-10-CM

## 2019-10-07 DIAGNOSIS — Z3A27 27 weeks gestation of pregnancy: Secondary | ICD-10-CM

## 2019-10-07 DIAGNOSIS — O283 Abnormal ultrasonic finding on antenatal screening of mother: Secondary | ICD-10-CM

## 2019-10-07 DIAGNOSIS — O99342 Other mental disorders complicating pregnancy, second trimester: Secondary | ICD-10-CM | POA: Diagnosis not present

## 2019-10-07 MED ORDER — MISC. DEVICES MISC: 0 refills | Status: DC

## 2019-10-07 NOTE — Progress Notes (Signed)
   PRENATAL VISIT NOTE  Subjective:  Jasmin Ellis is a 28 y.o. G4P3003 at [redacted]w[redacted]d being seen today for ongoing prenatal care.  She is currently monitored for the following issues for this high-risk pregnancy and has Back pain, chronic; Supervision of other high risk pregnancies, second trimester; Diabetes mellitus in pregnancy; Dizziness; Fatigue; Insomnia; Anxiety with depression; Dyspepsia; Tongue burning sensation; Back pain affecting pregnancy; Pelvic pain affecting pregnancy; Obesity in pregnancy; and Abnormal fetal ultrasound on their problem list.  Patient reports pelvic pressure.  Contractions: Not present. Vag. Bleeding: None.  Movement: Present. Denies leaking of fluid.   The following portions of the patient's history were reviewed and updated as appropriate: allergies, current medications, past family history, past medical history, past social history, past surgical history and problem list.   Objective:   Vitals:   10/07/19 0935  BP: 116/60  Pulse: 93  Weight: 207 lb 1.6 oz (93.9 kg)    Fetal Status: Fetal Heart Rate (bpm): 145   Movement: Present     General:  Alert, oriented and cooperative. Patient is in no acute distress.  Skin: Skin is warm and dry. No rash noted.   Cardiovascular: Normal heart rate noted  Respiratory: Normal respiratory effort, no problems with respiration noted  Abdomen: Soft, gravid, appropriate for gestational age.  Pain/Pressure: Present     Pelvic: Cervical exam deferred        Extremities: Normal range of motion.  Edema: None  Mental Status: Normal mood and affect. Normal behavior. Normal judgment and thought content.   Assessment and Plan:  Pregnancy: G4P3003 at [redacted]w[redacted]d  1. Supervision of other high risk pregnancies, second trimester -Tdap - CBC - HIV antibody - RPR - maternity belt script given today  2. Pre-existing type 1 diabetes mellitus during pregnancy in second trimester Cont baby ASA Cont NPH 8 units BID FG: 80-90 PP:  120-125 Bring log next visit Needs repeat fetal echo, scheduled for 10/15/19 F/u growth with MFM 10/08/19  3. Abnormal fetal ultrasound - Has f/u fetal echo scheduled  4. Anxiety with depression - Feeling good, takes intermittently bc she reports it sometimes makes her dizzy - Reports mood is stable, able to manage when it is not  Preterm labor symptoms and general obstetric precautions including but not limited to vaginal bleeding, contractions, leaking of fluid and fetal movement were reviewed in detail with the patient. Please refer to After Visit Summary for other counseling recommendations.   Return in about 2 weeks (around 10/21/2019) for high OB, virtual.  Future Appointments  Date Time Provider [place]  10/08/2019 10:15 AM [place] MFC-US  10/08/2019 10:15 AM [facility]-MFC [place] 4 WH-MFCUS MFC-US    Sloan Leiter, MD

## 2019-10-08 ENCOUNTER — Other Ambulatory Visit (HOSPITAL_COMMUNITY): Payer: Self-pay | Admitting: *Deleted

## 2019-10-08 ENCOUNTER — Ambulatory Visit (HOSPITAL_COMMUNITY)
Admission: RE | Admit: 2019-10-08 | Discharge: 2019-10-08 | Disposition: A | Discharge status: Home / Self Care | Payer: Medicaid Other | Source: Ambulatory Visit | Attending: Obstetrics and Gynecology | Admitting: Obstetrics and Gynecology

## 2019-10-08 ENCOUNTER — Ambulatory Visit (HOSPITAL_COMMUNITY): Payer: Medicaid Other | Admitting: *Deleted

## 2019-10-08 ENCOUNTER — Encounter (HOSPITAL_COMMUNITY): Payer: Self-pay

## 2019-10-08 DIAGNOSIS — O99212 Obesity complicating pregnancy, second trimester: Secondary | ICD-10-CM

## 2019-10-08 DIAGNOSIS — Z362 Encounter for other antenatal screening follow-up: Secondary | ICD-10-CM | POA: Diagnosis not present

## 2019-10-08 DIAGNOSIS — O283 Abnormal ultrasonic finding on antenatal screening of mother: Secondary | ICD-10-CM | POA: Insufficient documentation

## 2019-10-08 DIAGNOSIS — O3412 Maternal care for benign tumor of corpus uteri, second trimester: Secondary | ICD-10-CM | POA: Diagnosis not present

## 2019-10-08 DIAGNOSIS — Z3A27 27 weeks gestation of pregnancy: Secondary | ICD-10-CM

## 2019-10-08 DIAGNOSIS — O99210 Obesity complicating pregnancy, unspecified trimester: Secondary | ICD-10-CM | POA: Diagnosis present

## 2019-10-08 DIAGNOSIS — O24119 Pre-existing diabetes mellitus, type 2, in pregnancy, unspecified trimester: Secondary | ICD-10-CM

## 2019-10-08 DIAGNOSIS — O24112 Pre-existing diabetes mellitus, type 2, in pregnancy, second trimester: Secondary | ICD-10-CM

## 2019-10-08 DIAGNOSIS — D259 Leiomyoma of uterus, unspecified: Secondary | ICD-10-CM

## 2019-10-08 LAB — CBC
Hematocrit: 32.5 % — ABNORMAL LOW (ref 34.0–46.6)
Hemoglobin: 11.2 g/dL (ref 11.1–15.9)
MCH: 30.4 pg (ref 26.6–33.0)
MCHC: 34.5 g/dL (ref 31.5–35.7)
MCV: 88 fL (ref 79–97)
Platelets: 214 10*3/uL (ref 150–450)
RBC: 3.68 x10E6/uL — ABNORMAL LOW (ref 3.77–5.28)
RDW: 12.7 % (ref 11.7–15.4)
WBC: 8.2 10*3/uL (ref 3.4–10.8)

## 2019-10-08 LAB — HIV ANTIBODY (ROUTINE TESTING W REFLEX): HIV Screen 4th Generation wRfx: NONREACTIVE

## 2019-10-08 LAB — RPR: RPR Ser Ql: NONREACTIVE

## 2019-10-18 NOTE — L&D Delivery Note (Addendum)
OB/GYN Faculty Practice Delivery Note  Jasmin Ellis is a 29 y.o. [id number] s/p SVD at [redacted]w[redacted]d. She was admitted for IOL due to pre-existing DM2.   SROM: 3h 41m with clear fluid GBS Status:  Positive/-- (02/19 0000) Maximum Maternal Temperature: 98.4 F  Labor Progress: . Patient presented to L&D for IOL due to pre-existing DM2. Initial SVE: 0cm. Labor course was uncomplicated. She then progressed to complete.   Delivery Date/Time: 12/26/2019 @ [id number] Delivery: Called to room and patient was complete and pushing. Head position was ROA and delivered with ease over the perineum. No nuchal cord present. Shoulder and body delivered in usual fashion. Infant with spontaneous cry, placed on mother's abdomen, dried and stimulated. Cord clamped x 2 after 1-minute delay, and cut by FoB. Cord blood drawn. Placenta delivered spontaneously with gentle cord traction. Fundus firm with massage and pitocin started. Labia, perineum, vagina, and cervix inspected and significant for first degree perineal laceration.   Baby Weight: 3836g  Cord: central insertion, 3 vessel Placenta: Sent to L&D Complications: None Lacerations: First degree perineal laceration, hemostatic and not repaired. EBL: 600cc Analgesia: Epidural  PP IUD: Placed by Dr. Dione Plover after delivery of the placenta. Please see separate procedure note.   Infant: APGAR (1 MIN): 8   APGAR (5 MINS): 9    [name] MD, PGY-1 OBGYN Faculty Teaching Service  12/26/2019, 5:25 AM    OB FELLOW ATTESTATION  I was present, gloved, and supervising throughout delivery and have edited the above note to reflect any changes or updates.  Augustin Coupe, MD/MPH OB Fellow  12/26/2019, 8:19 AM

## 2019-10-21 ENCOUNTER — Ambulatory Visit (INDEPENDENT_AMBULATORY_CARE_PROVIDER_SITE_OTHER): Payer: Medicaid Other | Admitting: Obstetrics & Gynecology

## 2019-10-21 ENCOUNTER — Other Ambulatory Visit: Payer: Self-pay

## 2019-10-21 VITALS — BP 114/71 | HR 94 | Wt 206.1 lb

## 2019-10-21 DIAGNOSIS — O09893 Supervision of other high risk pregnancies, third trimester: Secondary | ICD-10-CM

## 2019-10-21 DIAGNOSIS — O24113 Pre-existing diabetes mellitus, type 2, in pregnancy, third trimester: Secondary | ICD-10-CM

## 2019-10-21 DIAGNOSIS — O09892 Supervision of other high risk pregnancies, second trimester: Secondary | ICD-10-CM

## 2019-10-21 DIAGNOSIS — Z3A29 29 weeks gestation of pregnancy: Secondary | ICD-10-CM

## 2019-10-21 NOTE — Patient Instructions (Signed)

## 2019-10-21 NOTE — Progress Notes (Signed)
Pt states is still having pain in Pelvic area. Was given Rx for [facility] but has not picked up.

## 2019-10-21 NOTE — Progress Notes (Signed)
   PRENATAL VISIT NOTE  Subjective:  Jasmin Ellis is a 29 y.o. G4P3003 at [redacted]w[redacted]d being seen today for ongoing prenatal care.  She is currently monitored for the following issues for this high-risk pregnancy and has Back pain, chronic; Supervision of other high risk pregnancies, second trimester; Diabetes mellitus in pregnancy; Dizziness; Fatigue; Insomnia; Anxiety with depression; Dyspepsia; Back pain affecting pregnancy; Pelvic pain affecting pregnancy; Obesity in pregnancy; and Abnormal fetal ultrasound on their problem list.  Patient reports no complaints.  Contractions: Not present. Vag. Bleeding: None.  Movement: Present. Denies leaking of fluid.   The following portions of the patient's history were reviewed and updated as appropriate: allergies, current medications, past family history, past medical history, past social history, past surgical history and problem list.   Objective:   Vitals:   10/21/19 1101  BP: 114/71  Pulse: 94  Weight: 206 lb 1.6 oz (93.5 kg)    Fetal Status: Fetal Heart Rate (bpm): 149   Movement: Present     General:  Alert, oriented and cooperative. Patient is in no acute distress.  Skin: Skin is warm and dry. No rash noted.   Cardiovascular: Normal heart rate noted  Respiratory: Normal respiratory effort, no problems with respiration noted  Abdomen: Soft, gravid, appropriate for gestational age.  Pain/Pressure: Present     Pelvic: Cervical exam deferred        Extremities: Normal range of motion.  Edema: None  Mental Status: Normal mood and affect. Normal behavior. Normal judgment and thought content.   Assessment and Plan:  Pregnancy: G4P3003 at [redacted]w[redacted]d 1. Supervision of other high risk pregnancies, second trimester Nl growth [place]  2. Pre-existing type 2 diabetes mellitus during pregnancy in third trimester FBS all <95, all PP 100-130, continue present insulin  Preterm labor symptoms and general obstetric precautions including but not limited to  vaginal bleeding, contractions, leaking of fluid and fetal movement were reviewed in detail with the patient. Please refer to After Visit Summary for other counseling recommendations.   Return in about 2 weeks (around 11/04/2019).  Future Appointments  Date Time Provider [place]  11/05/2019  9:45 AM [place] MFC-US  11/05/2019  9:45 AM [place] 2 WH-MFCUS MFC-US  11/05/2019 11:15 AM [place], Sallyanne Havers, MD WOC-WOCA WOC  11/12/2019 10:00 AM [place] MFC-US  11/12/2019 10:00 AM WH-MFC [place] 3 WH-MFCUS MFC-US    Emeterio Reeve, MD

## 2019-11-05 ENCOUNTER — Encounter: Payer: Self-pay | Admitting: Obstetrics & Gynecology

## 2019-11-05 ENCOUNTER — Ambulatory Visit (INDEPENDENT_AMBULATORY_CARE_PROVIDER_SITE_OTHER): Payer: Medicaid Other | Admitting: Obstetrics & Gynecology

## 2019-11-05 ENCOUNTER — Other Ambulatory Visit (HOSPITAL_COMMUNITY): Payer: Self-pay | Admitting: *Deleted

## 2019-11-05 ENCOUNTER — Encounter (HOSPITAL_COMMUNITY): Payer: Self-pay

## 2019-11-05 ENCOUNTER — Ambulatory Visit (HOSPITAL_COMMUNITY)
Admission: RE | Admit: 2019-11-05 | Discharge: 2019-11-05 | Disposition: A | Discharge status: Home / Self Care | Payer: Medicaid Other | Source: Ambulatory Visit | Attending: Obstetrics and Gynecology | Admitting: Obstetrics and Gynecology

## 2019-11-05 ENCOUNTER — Other Ambulatory Visit: Payer: Self-pay

## 2019-11-05 ENCOUNTER — Ambulatory Visit (HOSPITAL_COMMUNITY): Payer: Medicaid Other | Admitting: *Deleted

## 2019-11-05 ENCOUNTER — Other Ambulatory Visit (HOSPITAL_COMMUNITY): Payer: Self-pay | Admitting: Maternal & Fetal Medicine

## 2019-11-05 VITALS — BP 126/82 | HR 87 | Wt 208.0 lb

## 2019-11-05 DIAGNOSIS — O0993 Supervision of high risk pregnancy, unspecified, third trimester: Secondary | ICD-10-CM

## 2019-11-05 DIAGNOSIS — O24119 Pre-existing diabetes mellitus, type 2, in pregnancy, unspecified trimester: Secondary | ICD-10-CM | POA: Diagnosis present

## 2019-11-05 DIAGNOSIS — O99210 Obesity complicating pregnancy, unspecified trimester: Secondary | ICD-10-CM | POA: Diagnosis present

## 2019-11-05 DIAGNOSIS — Z794 Long term (current) use of insulin: Secondary | ICD-10-CM | POA: Diagnosis present

## 2019-11-05 DIAGNOSIS — Z362 Encounter for other antenatal screening follow-up: Secondary | ICD-10-CM | POA: Diagnosis not present

## 2019-11-05 DIAGNOSIS — O24113 Pre-existing diabetes mellitus, type 2, in pregnancy, third trimester: Secondary | ICD-10-CM

## 2019-11-05 DIAGNOSIS — O24319 Unspecified pre-existing diabetes mellitus in pregnancy, unspecified trimester: Secondary | ICD-10-CM

## 2019-11-05 DIAGNOSIS — Z3A31 31 weeks gestation of pregnancy: Secondary | ICD-10-CM

## 2019-11-05 DIAGNOSIS — O99213 Obesity complicating pregnancy, third trimester: Secondary | ICD-10-CM | POA: Diagnosis not present

## 2019-11-05 DIAGNOSIS — O3413 Maternal care for benign tumor of corpus uteri, third trimester: Secondary | ICD-10-CM

## 2019-11-05 DIAGNOSIS — O283 Abnormal ultrasonic finding on antenatal screening of mother: Secondary | ICD-10-CM | POA: Diagnosis present

## 2019-11-05 DIAGNOSIS — D259 Leiomyoma of uterus, unspecified: Secondary | ICD-10-CM

## 2019-11-05 DIAGNOSIS — F418 Other specified anxiety disorders: Secondary | ICD-10-CM

## 2019-11-05 MED ORDER — SERTRALINE HCL 25 MG PO TABS: 25.0000 mg | ORAL_TABLET | Freq: Every day | ORAL | 3 refills | Status: DC

## 2019-11-05 MED ORDER — HYDROXYZINE HCL 25 MG PO TABS: 25.0000 mg | ORAL_TABLET | Freq: Three times a day (TID) | ORAL | 3 refills | Status: DC | PRN

## 2019-11-05 NOTE — Progress Notes (Signed)
PRENATAL VISIT NOTE  Subjective:  Jasmin Ellis is a 29 y.o. [id number] at [redacted]w[redacted]d being seen today for ongoing prenatal care.  She is currently monitored for the following issues for this high-risk pregnancy and has Back pain, chronic; Supervision of other high risk pregnancies, second trimester; Diabetes mellitus in pregnancy; Dizziness; Fatigue; Insomnia; Anxiety with depression; Dyspepsia; Back pain affecting pregnancy; Pelvic pain affecting pregnancy; and Obesity in pregnancy on their problem list.  Patient reports no complaints.  Contractions: Not present. Vag. Bleeding: None.  Movement: Present. Denies leaking of fluid.   The following portions of the patient's history were reviewed and updated as appropriate: allergies, current medications, past family history, past medical history, past social history, past surgical history and problem list.   Objective:   Vitals:   11/05/19 1129  BP: 126/82  Pulse: 87  Weight: 208 lb (94.3 kg)    Fetal Status: Fetal Heart Rate (bpm): 146   Movement: Present     General:  Alert, oriented and cooperative. Patient is in no acute distress.  Skin: Skin is warm and dry. No rash noted.   Cardiovascular: Normal heart rate noted  Respiratory: Normal respiratory effort, no problems with respiration noted  Abdomen: Soft, gravid, appropriate for gestational age.  Pain/Pressure: Present     Pelvic: Cervical exam deferred        Extremities: Normal range of motion.  Edema: None  Mental Status: Normal mood and affect. Normal behavior. Normal judgment and thought content.   Assessment and Plan:  Pregnancy: G4P3003 at [redacted]w[redacted]d 1. Pre-existing type 2 diabetes mellitus during pregnancy in third trimester Reviewed CBGs, doing well on NPH 8 bid. Continue antenatal testing and scans as per MFM. Delivery at 39 weeks.  2. Anxiety with depression Refilled medications as requested - hydrOXYzine (ATARAX/VISTARIL) 25 MG tablet; Take 1 tablet (25 mg total) by mouth 3  (three) times daily as needed for anxiety.  Dispense: 60 tablet; Refill: 3 - sertraline (ZOLOFT) 25 MG tablet; Take 1 tablet (25 mg total) by mouth daily.  Dispense: 90 tablet; Refill: 3  3. Supervision of high risk pregnancy, antepartum, third trimester Preterm labor symptoms and general obstetric precautions including but not limited to vaginal bleeding, contractions, leaking of fluid and fetal movement were reviewed in detail with the patient. Please refer to After Visit Summary for other counseling recommendations.   Return in about 2 weeks (around 11/19/2019) for OFFICE OB Visit (book with MFM scan date).  Future Appointments  Date Time Provider [employer]  11/12/2019 10:00 AM [place] MFC-US  11/12/2019 10:00 AM WH-MFC [place] 3 WH-MFCUS MFC-US  11/19/2019 11:15 AM WH-MFC [place] 4 WH-MFCUS MFC-US  11/26/2019 11:00 AM WH-MFC [place] 3 WH-MFCUS MFC-US  12/03/2019 10:30 AM WH-MFC [place] 1 WH-MFCUS MFC-US    Verita Schneiders, MD

## 2019-11-12 ENCOUNTER — Other Ambulatory Visit: Payer: Self-pay

## 2019-11-12 ENCOUNTER — Ambulatory Visit (HOSPITAL_COMMUNITY)
Admission: RE | Admit: 2019-11-12 | Discharge: 2019-11-12 | Disposition: A | Discharge status: Home / Self Care | Payer: Medicaid Other | Source: Ambulatory Visit | Attending: Obstetrics and Gynecology | Admitting: Obstetrics and Gynecology

## 2019-11-12 ENCOUNTER — Encounter (HOSPITAL_COMMUNITY): Payer: Self-pay

## 2019-11-12 ENCOUNTER — Ambulatory Visit (HOSPITAL_COMMUNITY): Payer: Medicaid Other | Admitting: *Deleted

## 2019-11-12 DIAGNOSIS — O99210 Obesity complicating pregnancy, unspecified trimester: Secondary | ICD-10-CM | POA: Diagnosis present

## 2019-11-12 DIAGNOSIS — Z794 Long term (current) use of insulin: Secondary | ICD-10-CM | POA: Diagnosis present

## 2019-11-12 DIAGNOSIS — O99213 Obesity complicating pregnancy, third trimester: Secondary | ICD-10-CM

## 2019-11-12 DIAGNOSIS — O24113 Pre-existing diabetes mellitus, type 2, in pregnancy, third trimester: Secondary | ICD-10-CM | POA: Diagnosis not present

## 2019-11-12 DIAGNOSIS — D259 Leiomyoma of uterus, unspecified: Secondary | ICD-10-CM | POA: Diagnosis not present

## 2019-11-12 DIAGNOSIS — O3413 Maternal care for benign tumor of corpus uteri, third trimester: Secondary | ICD-10-CM | POA: Diagnosis not present

## 2019-11-12 DIAGNOSIS — O24319 Unspecified pre-existing diabetes mellitus in pregnancy, unspecified trimester: Secondary | ICD-10-CM

## 2019-11-12 DIAGNOSIS — Z3A32 32 weeks gestation of pregnancy: Secondary | ICD-10-CM

## 2019-11-19 ENCOUNTER — Ambulatory Visit (HOSPITAL_COMMUNITY): Payer: Medicaid Other

## 2019-11-19 ENCOUNTER — Encounter (HOSPITAL_COMMUNITY): Payer: Self-pay

## 2019-11-19 ENCOUNTER — Ambulatory Visit (HOSPITAL_COMMUNITY): Payer: Medicaid Other | Admitting: *Deleted

## 2019-11-19 ENCOUNTER — Ambulatory Visit (HOSPITAL_COMMUNITY)
Admission: RE | Admit: 2019-11-19 | Discharge: 2019-11-19 | Disposition: A | Discharge status: Home / Self Care | Payer: Medicaid Other | Source: Ambulatory Visit | Attending: Maternal & Fetal Medicine | Admitting: Maternal & Fetal Medicine

## 2019-11-19 ENCOUNTER — Other Ambulatory Visit: Payer: Self-pay

## 2019-11-19 ENCOUNTER — Telehealth (INDEPENDENT_AMBULATORY_CARE_PROVIDER_SITE_OTHER): Payer: Medicaid Other | Admitting: Obstetrics and Gynecology

## 2019-11-19 VITALS — Temp 97.8°F

## 2019-11-19 VITALS — BP 138/65 | HR 99 | Wt 209.0 lb

## 2019-11-19 DIAGNOSIS — Z3A33 33 weeks gestation of pregnancy: Secondary | ICD-10-CM

## 2019-11-19 DIAGNOSIS — D259 Leiomyoma of uterus, unspecified: Secondary | ICD-10-CM | POA: Diagnosis not present

## 2019-11-19 DIAGNOSIS — O24119 Pre-existing diabetes mellitus, type 2, in pregnancy, unspecified trimester: Secondary | ICD-10-CM | POA: Insufficient documentation

## 2019-11-19 DIAGNOSIS — Z794 Long term (current) use of insulin: Secondary | ICD-10-CM | POA: Insufficient documentation

## 2019-11-19 DIAGNOSIS — O3413 Maternal care for benign tumor of corpus uteri, third trimester: Secondary | ICD-10-CM

## 2019-11-19 DIAGNOSIS — O24113 Pre-existing diabetes mellitus, type 2, in pregnancy, third trimester: Secondary | ICD-10-CM

## 2019-11-19 DIAGNOSIS — O09893 Supervision of other high risk pregnancies, third trimester: Secondary | ICD-10-CM

## 2019-11-19 DIAGNOSIS — O24019 Pre-existing diabetes mellitus, type 1, in pregnancy, unspecified trimester: Secondary | ICD-10-CM

## 2019-11-19 DIAGNOSIS — F418 Other specified anxiety disorders: Secondary | ICD-10-CM

## 2019-11-19 DIAGNOSIS — O09892 Supervision of other high risk pregnancies, second trimester: Secondary | ICD-10-CM

## 2019-11-19 DIAGNOSIS — O99210 Obesity complicating pregnancy, unspecified trimester: Secondary | ICD-10-CM | POA: Diagnosis present

## 2019-11-19 DIAGNOSIS — O99213 Obesity complicating pregnancy, third trimester: Secondary | ICD-10-CM | POA: Diagnosis not present

## 2019-11-19 MED ORDER — INSULIN NPH (HUMAN) (ISOPHANE) 100 UNIT/ML ~~LOC~~ SUSP: SUBCUTANEOUS | 3 refills | Status: DC

## 2019-11-19 NOTE — Progress Notes (Signed)
Writes down Glucose

## 2019-11-19 NOTE — Progress Notes (Signed)
Prenatal Visit Note Date: 11/19/2019 Clinic: [facility]  Subjective:  Jasmin Ellis is a 29 y.o. [phone] at [redacted]w[redacted]d being seen today for ongoing prenatal care.  She is currently monitored for the following issues for this high-risk pregnancy and has Back pain, chronic; Supervision of other high risk pregnancies, second trimester; Diabetes mellitus in pregnancy; Dizziness; Fatigue; Insomnia; Anxiety with depression; Dyspepsia; and Obesity in pregnancy on their problem list.  Patient reports no complaints.   Contractions: Not present. Vag. Bleeding: None.  Movement: Present. Denies leaking of fluid.   The following portions of the patient's history were reviewed and updated as appropriate: allergies, current medications, past family history, past medical history, past social history, past surgical history and problem list. Problem list updated.  Objective:   Vitals:   11/19/19 0951  BP: 138/65  Pulse: 99  Weight: 209 lb (94.8 kg)    Fetal Status: Fetal Heart Rate (bpm): 145   Movement: Present     General:  Alert, oriented and cooperative. Patient is in no acute distress.  Skin: Skin is warm and dry. No rash noted.   Cardiovascular: Normal heart rate noted  Respiratory: Normal respiratory effort, no problems with respiration noted  Abdomen: Soft, gravid, appropriate for gestational age. Pain/Pressure: Absent     Pelvic:  Cervical exam deferred        Extremities: Normal range of motion.  Edema: None  Mental Status: Normal mood and affect. Normal behavior. Normal judgment and thought content.   Urinalysis:      Assessment and Plan:  Pregnancy: G4P3003 at [redacted]w[redacted]d  1. Supervision of other high risk pregnancies, second trimester Routine care. GBS nv - insulin NPH Human (HUMULIN N) 100 UNIT/ML injection; Inject before breakfast (10 units) and before supper (8 units)  Dispense: 10 mL; Refill: 3  2. Pre-existing type 2 diabetes mellitus during pregnancy,  antepartum On nph 8/8. 2h PP br/lu/din in the 120s. Will increase to 10 with breakfast and keep 8 at dinner. Has bpp later today. Continue with qwk testing.  - insulin NPH Human (HUMULIN N) 100 UNIT/ML injection; Inject before breakfast (10 units) and before supper (8 units)  Dispense: 10 mL; Refill: 3  Preterm labor symptoms and general obstetric precautions including but not limited to vaginal bleeding, contractions, leaking of fluid and fetal movement were reviewed in detail with the patient. Please refer to After Visit Summary for other counseling recommendations.  Return in about 2 weeks (around 12/03/2019) for high risk, in person, same day as mfm u/s.   Aletha Halim, MD

## 2019-11-26 ENCOUNTER — Other Ambulatory Visit: Payer: Self-pay

## 2019-11-26 ENCOUNTER — Encounter (HOSPITAL_COMMUNITY): Payer: Self-pay

## 2019-11-26 ENCOUNTER — Ambulatory Visit (HOSPITAL_COMMUNITY): Payer: Medicaid Other | Admitting: *Deleted

## 2019-11-26 ENCOUNTER — Ambulatory Visit (HOSPITAL_COMMUNITY)
Admission: RE | Admit: 2019-11-26 | Discharge: 2019-11-26 | Disposition: A | Discharge status: Home / Self Care | Payer: Medicaid Other | Source: Ambulatory Visit | Attending: Obstetrics and Gynecology | Admitting: Obstetrics and Gynecology

## 2019-11-26 ENCOUNTER — Other Ambulatory Visit (HOSPITAL_COMMUNITY): Payer: Self-pay | Admitting: *Deleted

## 2019-11-26 VITALS — BP 121/74 | HR 103 | Temp 97.3°F

## 2019-11-26 DIAGNOSIS — E109 Type 1 diabetes mellitus without complications: Secondary | ICD-10-CM

## 2019-11-26 DIAGNOSIS — O3413 Maternal care for benign tumor of corpus uteri, third trimester: Secondary | ICD-10-CM

## 2019-11-26 DIAGNOSIS — O99210 Obesity complicating pregnancy, unspecified trimester: Secondary | ICD-10-CM | POA: Insufficient documentation

## 2019-11-26 DIAGNOSIS — O99213 Obesity complicating pregnancy, third trimester: Secondary | ICD-10-CM

## 2019-11-26 DIAGNOSIS — Z794 Long term (current) use of insulin: Secondary | ICD-10-CM | POA: Insufficient documentation

## 2019-11-26 DIAGNOSIS — D259 Leiomyoma of uterus, unspecified: Secondary | ICD-10-CM

## 2019-11-26 DIAGNOSIS — Z3A34 34 weeks gestation of pregnancy: Secondary | ICD-10-CM

## 2019-11-26 DIAGNOSIS — O24119 Pre-existing diabetes mellitus, type 2, in pregnancy, unspecified trimester: Secondary | ICD-10-CM | POA: Diagnosis not present

## 2019-11-26 DIAGNOSIS — O24113 Pre-existing diabetes mellitus, type 2, in pregnancy, third trimester: Secondary | ICD-10-CM | POA: Diagnosis not present

## 2019-12-03 ENCOUNTER — Other Ambulatory Visit (HOSPITAL_COMMUNITY)
Admission: RE | Admit: 2019-12-03 | Discharge: 2019-12-03 | Disposition: A | Discharge status: Home / Self Care | Payer: Medicaid Other | Source: Ambulatory Visit | Attending: Obstetrics and Gynecology | Admitting: Obstetrics and Gynecology

## 2019-12-03 ENCOUNTER — Other Ambulatory Visit: Payer: Self-pay

## 2019-12-03 ENCOUNTER — Ambulatory Visit (INDEPENDENT_AMBULATORY_CARE_PROVIDER_SITE_OTHER): Payer: Medicaid Other | Admitting: Obstetrics and Gynecology

## 2019-12-03 ENCOUNTER — Ambulatory Visit (HOSPITAL_COMMUNITY)
Admission: RE | Admit: 2019-12-03 | Discharge: 2019-12-03 | Disposition: A | Discharge status: Home / Self Care | Payer: Medicaid Other | Source: Ambulatory Visit | Attending: Obstetrics and Gynecology | Admitting: Obstetrics and Gynecology

## 2019-12-03 ENCOUNTER — Encounter: Payer: Self-pay | Admitting: Obstetrics and Gynecology

## 2019-12-03 ENCOUNTER — Ambulatory Visit (HOSPITAL_COMMUNITY): Payer: Medicaid Other | Admitting: *Deleted

## 2019-12-03 ENCOUNTER — Encounter (HOSPITAL_COMMUNITY): Payer: Self-pay

## 2019-12-03 VITALS — BP 131/79 | HR 112 | Wt 209.9 lb

## 2019-12-03 DIAGNOSIS — Z3A35 35 weeks gestation of pregnancy: Secondary | ICD-10-CM

## 2019-12-03 DIAGNOSIS — Z362 Encounter for other antenatal screening follow-up: Secondary | ICD-10-CM

## 2019-12-03 DIAGNOSIS — O09892 Supervision of other high risk pregnancies, second trimester: Secondary | ICD-10-CM | POA: Insufficient documentation

## 2019-12-03 DIAGNOSIS — Z794 Long term (current) use of insulin: Secondary | ICD-10-CM | POA: Diagnosis present

## 2019-12-03 DIAGNOSIS — O99210 Obesity complicating pregnancy, unspecified trimester: Secondary | ICD-10-CM

## 2019-12-03 DIAGNOSIS — O09893 Supervision of other high risk pregnancies, third trimester: Secondary | ICD-10-CM

## 2019-12-03 DIAGNOSIS — O24113 Pre-existing diabetes mellitus, type 2, in pregnancy, third trimester: Secondary | ICD-10-CM | POA: Diagnosis not present

## 2019-12-03 DIAGNOSIS — O99213 Obesity complicating pregnancy, third trimester: Secondary | ICD-10-CM

## 2019-12-03 DIAGNOSIS — O24119 Pre-existing diabetes mellitus, type 2, in pregnancy, unspecified trimester: Secondary | ICD-10-CM | POA: Diagnosis not present

## 2019-12-03 DIAGNOSIS — O24112 Pre-existing diabetes mellitus, type 2, in pregnancy, second trimester: Secondary | ICD-10-CM

## 2019-12-03 NOTE — Progress Notes (Signed)
   PRENATAL VISIT NOTE  Subjective:  Jasmin Ellis is a 29 y.o. [id number] at [redacted]w[redacted]d being seen today for ongoing prenatal care.  She is currently monitored for the following issues for this high-risk pregnancy and has Back pain, chronic; Pre-existing type 2 diabetes mellitus during pregnancy in second trimester; Supervision of other high risk pregnancies, second trimester; Diabetes mellitus in pregnancy in third trimester; Dizziness; Fatigue; Insomnia; Anxiety with depression; Dyspepsia; and Obesity in pregnancy on their problem list.  Patient reports no complaints.  Contractions: Irritability. Vag. Bleeding: None.  Movement: Present. Denies leaking of fluid.   The following portions of the patient's history were reviewed and updated as appropriate: allergies, current medications, past family history, past medical history, past social history, past surgical history and problem list.   Objective:   Vitals:   12/03/19 0945  BP: 131/79  Pulse: (!) 112  Weight: 209 lb 14.4 oz (95.2 kg)    Fetal Status: Fetal Heart Rate (bpm): 154 Fundal Height: 35 cm Movement: Present     General:  Alert, oriented and cooperative. Patient is in no acute distress.  Skin: Skin is warm and dry. No rash noted.   Cardiovascular: Normal heart rate noted  Respiratory: Normal respiratory effort, no problems with respiration noted  Abdomen: Soft, gravid, appropriate for gestational age.  Pain/Pressure: Absent     Pelvic: Cervical exam performed Dilation: 1 Effacement (%): Thick Station: Ballotable  Extremities: Normal range of motion.  Edema: None  Mental Status: Normal mood and affect. Normal behavior. Normal judgment and thought content.   Assessment and Plan:  Pregnancy: G4P3003 at [redacted]w[redacted]d 1. Supervision of other high risk pregnancies, second trimester Patient is doing well without complaints.  Cultures today  2. Pre-existing type 2 diabetes mellitus during pregnancy in second trimester CBGs reviewed and all  fasting within range (69-95) and pp range 119-124 Will continue with NPH 10/8 Continue weekly antenatal testing. Patient scheduled for growth and BPP today  3. Obesity in pregnancy   Preterm labor symptoms and general obstetric precautions including but not limited to vaginal bleeding, contractions, leaking of fluid and fetal movement were reviewed in detail with the patient. Please refer to After Visit Summary for other counseling recommendations.   Return in about 1 week (around 12/10/2019) for in person, ROB, High risk.  Future Appointments  Date Time Provider [place]  12/03/2019 10:30 AM [place] MFC-US  12/03/2019 10:30 AM WH-MFC [place] 1 WH-MFCUS MFC-US  12/10/2019  3:15 PM WH-MFC NURSE WH-MFC MFC-US  12/10/2019  3:15 PM WH-MFC [place] 4 WH-MFCUS MFC-US  12/17/2019 11:00 AM [place] MFC-US  12/17/2019 11:00 AM WH-MFC [place] 3 WH-MFCUS MFC-US  12/24/2019 10:45 AM WH-MFC NURSE WH-MFC MFC-US  12/24/2019 10:45 AM WH-MFC [place] 2 WH-MFCUS MFC-US    Mora Bellman, MD

## 2019-12-04 LAB — GC/CHLAMYDIA PROBE AMP (~~LOC~~) NOT AT ARMC
Chlamydia: NEGATIVE
Comment: NEGATIVE
Comment: NORMAL
Neisseria Gonorrhea: NEGATIVE

## 2019-12-04 LAB — OB RESULTS CONSOLE GC/CHLAMYDIA: Gonorrhea: NEGATIVE

## 2019-12-06 LAB — OB RESULTS CONSOLE GBS: GBS: POSITIVE

## 2019-12-06 LAB — CULTURE, BETA STREP (GROUP B ONLY): Strep Gp B Culture: POSITIVE — AB

## 2019-12-09 ENCOUNTER — Encounter: Payer: Self-pay | Admitting: Obstetrics and Gynecology

## 2019-12-09 DIAGNOSIS — O99820 Streptococcus B carrier state complicating pregnancy: Secondary | ICD-10-CM

## 2019-12-09 HISTORY — DX: Streptococcus B carrier state complicating pregnancy: O99.820

## 2019-12-10 ENCOUNTER — Ambulatory Visit (HOSPITAL_COMMUNITY): Payer: Medicaid Other | Admitting: *Deleted

## 2019-12-10 ENCOUNTER — Other Ambulatory Visit: Payer: Self-pay

## 2019-12-10 ENCOUNTER — Ambulatory Visit (HOSPITAL_COMMUNITY)
Admission: RE | Admit: 2019-12-10 | Discharge: 2019-12-10 | Disposition: A | Discharge status: Home / Self Care | Payer: Medicaid Other | Source: Ambulatory Visit | Attending: Obstetrics | Admitting: Obstetrics

## 2019-12-10 ENCOUNTER — Encounter (HOSPITAL_COMMUNITY): Payer: Self-pay | Admitting: *Deleted

## 2019-12-10 DIAGNOSIS — O24113 Pre-existing diabetes mellitus, type 2, in pregnancy, third trimester: Secondary | ICD-10-CM | POA: Diagnosis not present

## 2019-12-10 DIAGNOSIS — E109 Type 1 diabetes mellitus without complications: Secondary | ICD-10-CM | POA: Insufficient documentation

## 2019-12-10 DIAGNOSIS — Z3A36 36 weeks gestation of pregnancy: Secondary | ICD-10-CM

## 2019-12-10 DIAGNOSIS — O99213 Obesity complicating pregnancy, third trimester: Secondary | ICD-10-CM | POA: Diagnosis not present

## 2019-12-10 DIAGNOSIS — O3413 Maternal care for benign tumor of corpus uteri, third trimester: Secondary | ICD-10-CM

## 2019-12-10 DIAGNOSIS — O99210 Obesity complicating pregnancy, unspecified trimester: Secondary | ICD-10-CM

## 2019-12-10 DIAGNOSIS — O99820 Streptococcus B carrier state complicating pregnancy: Secondary | ICD-10-CM | POA: Diagnosis present

## 2019-12-10 DIAGNOSIS — D259 Leiomyoma of uterus, unspecified: Secondary | ICD-10-CM | POA: Diagnosis not present

## 2019-12-12 ENCOUNTER — Encounter: Payer: Self-pay | Admitting: Obstetrics and Gynecology

## 2019-12-12 ENCOUNTER — Telehealth: Payer: Self-pay | Admitting: Obstetrics and Gynecology

## 2019-12-12 ENCOUNTER — Encounter: Payer: Medicaid Other | Admitting: Obstetrics and Gynecology

## 2019-12-12 NOTE — Telephone Encounter (Signed)
Attempted to contact patient to get her rescheduled for her missed ob appointment. No answer and unable to leave a voicemail due to it not being set-up. No show letter mailed. Mychart message sent

## 2019-12-13 NOTE — Progress Notes (Signed)
Patient did not keep her OB appointment for 12/12/2019.  Durene Romans MD Attending Center for [employer])

## 2019-12-17 ENCOUNTER — Ambulatory Visit (HOSPITAL_COMMUNITY): Payer: Medicaid Other | Admitting: *Deleted

## 2019-12-17 ENCOUNTER — Other Ambulatory Visit: Payer: Self-pay

## 2019-12-17 ENCOUNTER — Ambulatory Visit (HOSPITAL_COMMUNITY)
Admission: RE | Admit: 2019-12-17 | Discharge: 2019-12-17 | Disposition: A | Discharge status: Home / Self Care | Payer: Medicaid Other | Source: Ambulatory Visit | Attending: Obstetrics | Admitting: Obstetrics

## 2019-12-17 ENCOUNTER — Encounter (HOSPITAL_COMMUNITY): Payer: Self-pay

## 2019-12-17 VITALS — BP 137/77 | HR 97 | Temp 97.5°F

## 2019-12-17 DIAGNOSIS — Z794 Long term (current) use of insulin: Secondary | ICD-10-CM | POA: Diagnosis present

## 2019-12-17 DIAGNOSIS — O24119 Pre-existing diabetes mellitus, type 2, in pregnancy, unspecified trimester: Secondary | ICD-10-CM | POA: Diagnosis present

## 2019-12-17 DIAGNOSIS — Z3A37 37 weeks gestation of pregnancy: Secondary | ICD-10-CM | POA: Diagnosis not present

## 2019-12-17 DIAGNOSIS — O99210 Obesity complicating pregnancy, unspecified trimester: Secondary | ICD-10-CM | POA: Diagnosis present

## 2019-12-17 DIAGNOSIS — O24113 Pre-existing diabetes mellitus, type 2, in pregnancy, third trimester: Secondary | ICD-10-CM | POA: Diagnosis not present

## 2019-12-17 DIAGNOSIS — O99820 Streptococcus B carrier state complicating pregnancy: Secondary | ICD-10-CM | POA: Diagnosis present

## 2019-12-17 DIAGNOSIS — E109 Type 1 diabetes mellitus without complications: Secondary | ICD-10-CM | POA: Insufficient documentation

## 2019-12-17 DIAGNOSIS — O99213 Obesity complicating pregnancy, third trimester: Secondary | ICD-10-CM

## 2019-12-19 ENCOUNTER — Other Ambulatory Visit: Payer: Self-pay

## 2019-12-19 ENCOUNTER — Ambulatory Visit (INDEPENDENT_AMBULATORY_CARE_PROVIDER_SITE_OTHER): Payer: Medicaid Other | Admitting: Obstetrics & Gynecology

## 2019-12-19 VITALS — BP 131/77 | HR 89 | Wt 212.0 lb

## 2019-12-19 DIAGNOSIS — O99820 Streptococcus B carrier state complicating pregnancy: Secondary | ICD-10-CM

## 2019-12-19 DIAGNOSIS — O24113 Pre-existing diabetes mellitus, type 2, in pregnancy, third trimester: Secondary | ICD-10-CM

## 2019-12-19 DIAGNOSIS — B951 Streptococcus, group B, as the cause of diseases classified elsewhere: Secondary | ICD-10-CM

## 2019-12-19 DIAGNOSIS — E669 Obesity, unspecified: Secondary | ICD-10-CM

## 2019-12-19 DIAGNOSIS — Z3A38 38 weeks gestation of pregnancy: Secondary | ICD-10-CM

## 2019-12-19 DIAGNOSIS — O99213 Obesity complicating pregnancy, third trimester: Secondary | ICD-10-CM

## 2019-12-19 DIAGNOSIS — O09893 Supervision of other high risk pregnancies, third trimester: Secondary | ICD-10-CM

## 2019-12-19 DIAGNOSIS — O99210 Obesity complicating pregnancy, unspecified trimester: Secondary | ICD-10-CM

## 2019-12-19 DIAGNOSIS — O09892 Supervision of other high risk pregnancies, second trimester: Secondary | ICD-10-CM

## 2019-12-19 DIAGNOSIS — O24112 Pre-existing diabetes mellitus, type 2, in pregnancy, second trimester: Secondary | ICD-10-CM

## 2019-12-19 NOTE — Progress Notes (Signed)
   PRENATAL VISIT NOTE  Subjective:  Jasmin Ellis is a 29 y.o. [id number] at [redacted]w[redacted]d being seen today for ongoing prenatal care.  She is currently monitored for the following issues for this high-risk pregnancy and has Back pain, chronic; Pre-existing type 2 diabetes mellitus during pregnancy in second trimester; Supervision of other high risk pregnancies, second trimester; Diabetes mellitus in pregnancy in third trimester; Dizziness; Fatigue; Insomnia; Anxiety with depression; Dyspepsia; Obesity in pregnancy; and GBS (group B Streptococcus carrier), +RV culture, currently pregnant on their problem list.  Patient reports no complaints.  Contractions: Not present. Vag. Bleeding: None.  Movement: Present. Denies leaking of fluid.   The following portions of the patient's history were reviewed and updated as appropriate: allergies, current medications, past family history, past medical history, past social history, past surgical history and problem list.   Objective:   Vitals:   12/19/19 1003  BP: (!) 142/77  Pulse: 89  Weight: 212 lb (96.2 kg)    Fetal Status: Fetal Heart Rate (bpm): 140   Movement: Present     General:  Alert, oriented and cooperative. Patient is in no acute distress.  Skin: Skin is warm and dry. No rash noted.   Cardiovascular: Normal heart rate noted  Respiratory: Normal respiratory effort, no problems with respiration noted  Abdomen: Soft, gravid, appropriate for gestational age.  Pain/Pressure: Present     Pelvic: Cervical exam performed        Extremities: Normal range of motion.  Edema: None  Mental Status: Normal mood and affect. Normal behavior. Normal judgment and thought content.   Assessment and Plan:  Pregnancy: G4P3003 at [redacted]w[redacted]d 1. Supervision of other high risk pregnancies, second trimester   2. Pre-existing type 2 diabetes mellitus during pregnancy in second trimester - IOL scheduled for 39 weeks  - weekly BPPs with MFM - Excellent sugars  3.  Obesity in pregnancy   4. GBS (group B Streptococcus carrier), +RV culture, currently pregnant - treat in labor  Term labor symptoms and general obstetric precautions including but not limited to vaginal bleeding, contractions, leaking of fluid and fetal movement were reviewed in detail with the patient. Please refer to After Visit Summary for other counseling recommendations.   No follow-ups on file.  Future Appointments  Date Time Provider [place]  12/24/2019 10:45 AM [place] MFC-US  12/24/2019 10:45 AM [place] 2 WH-MFCUS MFC-US  12/26/2019  2:15 PM Aletha Halim, MD WOC-WOCA WOC  01/02/2020  2:15 PM Chancy Milroy, MD [place]  01/02/2020  3:15 PM WOC-WOCA NST WOC-WOCA WOC    Lashana Spang Marijo Sanes, MD

## 2019-12-24 ENCOUNTER — Encounter (HOSPITAL_COMMUNITY): Payer: Self-pay

## 2019-12-24 ENCOUNTER — Ambulatory Visit (HOSPITAL_BASED_OUTPATIENT_CLINIC_OR_DEPARTMENT_OTHER)
Admission: RE | Admit: 2019-12-24 | Discharge: 2019-12-24 | Disposition: A | Discharge status: Home / Self Care | Payer: Medicaid Other | Source: Ambulatory Visit | Attending: Obstetrics | Admitting: Obstetrics

## 2019-12-24 ENCOUNTER — Other Ambulatory Visit: Payer: Self-pay

## 2019-12-24 ENCOUNTER — Ambulatory Visit (HOSPITAL_COMMUNITY): Payer: Medicaid Other | Admitting: *Deleted

## 2019-12-24 ENCOUNTER — Other Ambulatory Visit: Payer: Self-pay | Admitting: Family Medicine

## 2019-12-24 VITALS — BP 125/79 | HR 95 | Temp 96.9°F

## 2019-12-24 DIAGNOSIS — O24113 Pre-existing diabetes mellitus, type 2, in pregnancy, third trimester: Secondary | ICD-10-CM

## 2019-12-24 DIAGNOSIS — Z3A38 38 weeks gestation of pregnancy: Secondary | ICD-10-CM | POA: Diagnosis not present

## 2019-12-24 DIAGNOSIS — O99820 Streptococcus B carrier state complicating pregnancy: Secondary | ICD-10-CM | POA: Insufficient documentation

## 2019-12-24 DIAGNOSIS — O24119 Pre-existing diabetes mellitus, type 2, in pregnancy, unspecified trimester: Secondary | ICD-10-CM | POA: Insufficient documentation

## 2019-12-24 DIAGNOSIS — O99210 Obesity complicating pregnancy, unspecified trimester: Secondary | ICD-10-CM | POA: Insufficient documentation

## 2019-12-24 DIAGNOSIS — Z794 Long term (current) use of insulin: Secondary | ICD-10-CM | POA: Insufficient documentation

## 2019-12-24 DIAGNOSIS — E109 Type 1 diabetes mellitus without complications: Secondary | ICD-10-CM | POA: Insufficient documentation

## 2019-12-25 ENCOUNTER — Inpatient Hospital Stay (HOSPITAL_COMMUNITY): Payer: Medicaid Other

## 2019-12-25 ENCOUNTER — Inpatient Hospital Stay (HOSPITAL_COMMUNITY): Payer: Medicaid Other | Admitting: Anesthesiology

## 2019-12-25 ENCOUNTER — Encounter (HOSPITAL_COMMUNITY): Payer: Self-pay | Admitting: Obstetrics & Gynecology

## 2019-12-25 ENCOUNTER — Inpatient Hospital Stay (HOSPITAL_COMMUNITY)
Admission: AD | Admit: 2019-12-25 | Discharge: 2019-12-27 | DRG: 807 | Disposition: A | Discharge status: Home / Self Care | Payer: Medicaid Other | Attending: Family Medicine | Admitting: Family Medicine

## 2019-12-25 DIAGNOSIS — Z20822 Contact with and (suspected) exposure to covid-19: Secondary | ICD-10-CM | POA: Diagnosis present

## 2019-12-25 DIAGNOSIS — E119 Type 2 diabetes mellitus without complications: Secondary | ICD-10-CM | POA: Diagnosis present

## 2019-12-25 DIAGNOSIS — O99344 Other mental disorders complicating childbirth: Secondary | ICD-10-CM | POA: Diagnosis present

## 2019-12-25 DIAGNOSIS — O09892 Supervision of other high risk pregnancies, second trimester: Secondary | ICD-10-CM

## 2019-12-25 DIAGNOSIS — Z30430 Encounter for insertion of intrauterine contraceptive device: Secondary | ICD-10-CM

## 2019-12-25 DIAGNOSIS — O134 Gestational [pregnancy-induced] hypertension without significant proteinuria, complicating childbirth: Secondary | ICD-10-CM | POA: Diagnosis present

## 2019-12-25 DIAGNOSIS — O99824 Streptococcus B carrier state complicating childbirth: Secondary | ICD-10-CM | POA: Diagnosis present

## 2019-12-25 DIAGNOSIS — O99210 Obesity complicating pregnancy, unspecified trimester: Secondary | ICD-10-CM

## 2019-12-25 DIAGNOSIS — Z3A39 39 weeks gestation of pregnancy: Secondary | ICD-10-CM | POA: Diagnosis not present

## 2019-12-25 DIAGNOSIS — Z794 Long term (current) use of insulin: Secondary | ICD-10-CM

## 2019-12-25 DIAGNOSIS — O139 Gestational [pregnancy-induced] hypertension without significant proteinuria, unspecified trimester: Secondary | ICD-10-CM

## 2019-12-25 DIAGNOSIS — O24113 Pre-existing diabetes mellitus, type 2, in pregnancy, third trimester: Secondary | ICD-10-CM | POA: Diagnosis present

## 2019-12-25 DIAGNOSIS — Z30014 Encounter for initial prescription of intrauterine contraceptive device: Secondary | ICD-10-CM | POA: Diagnosis not present

## 2019-12-25 DIAGNOSIS — O99820 Streptococcus B carrier state complicating pregnancy: Secondary | ICD-10-CM

## 2019-12-25 DIAGNOSIS — O2412 Pre-existing diabetes mellitus, type 2, in childbirth: Secondary | ICD-10-CM | POA: Diagnosis present

## 2019-12-25 DIAGNOSIS — F418 Other specified anxiety disorders: Secondary | ICD-10-CM | POA: Diagnosis present

## 2019-12-25 DIAGNOSIS — O24913 Unspecified diabetes mellitus in pregnancy, third trimester: Secondary | ICD-10-CM | POA: Diagnosis present

## 2019-12-25 DIAGNOSIS — Z975 Presence of (intrauterine) contraceptive device: Secondary | ICD-10-CM

## 2019-12-25 LAB — COMPREHENSIVE METABOLIC PANEL
ALT: 13 U/L (ref 0–44)
AST: 23 U/L (ref 15–41)
Albumin: 3 g/dL — ABNORMAL LOW (ref 3.5–5.0)
Alkaline Phosphatase: 84 U/L (ref 38–126)
Anion gap: 7 (ref 5–15)
BUN: <5 mg/dL — ABNORMAL LOW (ref 6–20)
CO2: 22 mmol/L (ref 22–32)
Calcium: 9.3 mg/dL (ref 8.9–10.3)
Chloride: 105 mmol/L (ref 98–111)
Creatinine, Ser: 0.54 mg/dL (ref 0.44–1.00)
GFR calc Af Amer: >60 mL/min (ref >60)
GFR calc non Af Amer: >60 mL/min (ref >60)
Glucose, Bld: 104 mg/dL — ABNORMAL HIGH (ref 70–99)
Potassium: 4.3 mmol/L (ref 3.5–5.1)
Sodium: 134 mmol/L — ABNORMAL LOW (ref 135–145)
Total Bilirubin: 0.4 mg/dL (ref 0.3–1.2)
Total Protein: 6.2 g/dL — ABNORMAL LOW (ref 6.5–8.1)

## 2019-12-25 LAB — GLUCOSE, CAPILLARY
Glucose-Capillary: 149 mg/dL — ABNORMAL HIGH (ref 70–99)
Glucose-Capillary: 89 mg/dL (ref 70–99)
Glucose-Capillary: 50 mg/dL — ABNORMAL LOW (ref 70–99)
Glucose-Capillary: 91 mg/dL (ref 70–99)
Glucose-Capillary: 70 mg/dL (ref 70–99)
Glucose-Capillary: 147 mg/dL — ABNORMAL HIGH (ref 70–99)

## 2019-12-25 LAB — CBC
HCT: 33.6 % — ABNORMAL LOW (ref 36.0–46.0)
Hemoglobin: 11.3 g/dL — ABNORMAL LOW (ref 12.0–15.0)
MCH: 29.7 pg (ref 26.0–34.0)
MCHC: 33.6 g/dL (ref 30.0–36.0)
MCV: 88.2 fL (ref 80.0–100.0)
Platelets: 174 10*3/uL (ref 150–400)
RBC: 3.81 MIL/uL — ABNORMAL LOW (ref 3.87–5.11)
RDW: 13.2 % (ref 11.5–15.5)
WBC: 5.9 10*3/uL (ref 4.0–10.5)
nRBC: 0 % (ref 0.0–0.2)

## 2019-12-25 LAB — PROTEIN / CREATININE RATIO, URINE
Creatinine, Urine: 43.57 mg/dL
Total Protein, Urine: <6 mg/dL

## 2019-12-25 LAB — ABO/RH: ABO/RH(D): O POS

## 2019-12-25 LAB — RPR: RPR Ser Ql: NONREACTIVE

## 2019-12-25 LAB — SARS CORONAVIRUS 2 (TAT 6-24 HRS): SARS Coronavirus 2: NEGATIVE

## 2019-12-25 MED ORDER — OXYCODONE-ACETAMINOPHEN 5-325 MG PO TABS: 1.0000 | ORAL_TABLET | ORAL | Status: DC | PRN

## 2019-12-25 MED ORDER — EPHEDRINE 5 MG/ML INJ: 10.0000 mg | INTRAVENOUS | Status: DC | PRN

## 2019-12-25 MED ORDER — SODIUM CHLORIDE 0.9 % IV SOLN
2.0000 g | Freq: Once | INTRAVENOUS | Status: AC
  Administered 2019-12-25: 2 g via INTRAVENOUS
  Filled 2019-12-25: qty 2000

## 2019-12-25 MED ORDER — ACETAMINOPHEN 325 MG PO TABS: 650.0000 mg | ORAL_TABLET | ORAL | Status: DC | PRN

## 2019-12-25 MED ORDER — LIDOCAINE HCL (PF) 1 % IJ SOLN: 30.0000 mL | INTRAMUSCULAR | Status: DC | PRN

## 2019-12-25 MED ORDER — SODIUM CHLORIDE 0.9 % IV SOLN
2.0000 g | Freq: Four times a day (QID) | INTRAVENOUS | Status: DC
  Administered 2019-12-25 – 2019-12-26 (×2): 2 g via INTRAVENOUS
  Filled 2019-12-25 (×2): qty 2000

## 2019-12-25 MED ORDER — OXYTOCIN 40 UNITS IN NORMAL SALINE INFUSION - SIMPLE MED
2.5000 [IU]/h | INTRAVENOUS | Status: DC
  Administered 2019-12-26: 2.5 [IU]/h via INTRAVENOUS
  Filled 2019-12-25: qty 1000

## 2019-12-25 MED ORDER — DEXTROSE IN LACTATED RINGERS 5 % IV SOLN: INTRAVENOUS | Status: DC

## 2019-12-25 MED ORDER — INSULIN ASPART 100 UNIT/ML ~~LOC~~ SOLN
0.0000 [IU] | SUBCUTANEOUS | Status: DC
  Administered 2019-12-25: 2 [IU] via SUBCUTANEOUS

## 2019-12-25 MED ORDER — OXYTOCIN 40 UNITS IN NORMAL SALINE INFUSION - SIMPLE MED
1.0000 m[IU]/min | INTRAVENOUS | Status: DC
  Administered 2019-12-25: 22:00:00 2 m[IU]/min via INTRAVENOUS
  Filled 2019-12-25: qty 1000

## 2019-12-25 MED ORDER — LIDOCAINE HCL (PF) 1 % IJ SOLN
INTRAMUSCULAR | Status: DC | PRN
  Administered 2019-12-25 (×2): 4 mL via EPIDURAL

## 2019-12-25 MED ORDER — MISOPROSTOL 100 MCG PO TABS
25.0000 ug | ORAL_TABLET | ORAL | Status: DC
  Administered 2019-12-25: 25 ug via VAGINAL

## 2019-12-25 MED ORDER — MISOPROSTOL 25 MCG QUARTER TABLET
ORAL_TABLET | ORAL | Status: AC
  Filled 2019-12-25: qty 1

## 2019-12-25 MED ORDER — LACTATED RINGERS IV SOLN: 500.0000 mL | Freq: Once | INTRAVENOUS | Status: DC

## 2019-12-25 MED ORDER — TERBUTALINE SULFATE 1 MG/ML IJ SOLN: 0.2500 mg | Freq: Once | INTRAMUSCULAR | Status: DC | PRN

## 2019-12-25 MED ORDER — PHENYLEPHRINE 40 MCG/ML (10ML) SYRINGE FOR IV PUSH (FOR BLOOD PRESSURE SUPPORT): 80.0000 ug | PREFILLED_SYRINGE | INTRAVENOUS | Status: DC | PRN

## 2019-12-25 MED ORDER — LACTATED RINGERS IV SOLN: INTRAVENOUS | Status: DC

## 2019-12-25 MED ORDER — PHENYLEPHRINE 40 MCG/ML (10ML) SYRINGE FOR IV PUSH (FOR BLOOD PRESSURE SUPPORT)
80.0000 ug | PREFILLED_SYRINGE | INTRAVENOUS | Status: DC | PRN
  Filled 2019-12-25: qty 10

## 2019-12-25 MED ORDER — MISOPROSTOL 50MCG HALF TABLET
50.0000 ug | ORAL_TABLET | Freq: Once | ORAL | Status: AC
  Administered 2019-12-25: 50 ug via BUCCAL
  Filled 2019-12-25: qty 1

## 2019-12-25 MED ORDER — FENTANYL-BUPIVACAINE-NACL 0.5-0.125-0.9 MG/250ML-% EP SOLN
12.0000 mL/h | EPIDURAL | Status: DC | PRN
  Filled 2019-12-25: qty 250

## 2019-12-25 MED ORDER — SODIUM CHLORIDE (PF) 0.9 % IJ SOLN
INTRAMUSCULAR | Status: DC | PRN
  Administered 2019-12-25: 12 mL/h via EPIDURAL

## 2019-12-25 MED ORDER — SOD CITRATE-CITRIC ACID 500-334 MG/5ML PO SOLN: 30.0000 mL | ORAL | Status: DC | PRN

## 2019-12-25 MED ORDER — OXYTOCIN BOLUS FROM INFUSION
500.0000 mL | Freq: Once | INTRAVENOUS | Status: AC
  Administered 2019-12-26: 500 mL via INTRAVENOUS

## 2019-12-25 MED ORDER — DIPHENHYDRAMINE HCL 50 MG/ML IJ SOLN: 12.5000 mg | INTRAMUSCULAR | Status: DC | PRN

## 2019-12-25 MED ORDER — ONDANSETRON HCL 4 MG/2ML IJ SOLN: 4.0000 mg | Freq: Four times a day (QID) | INTRAMUSCULAR | Status: DC | PRN

## 2019-12-25 MED ORDER — OXYCODONE-ACETAMINOPHEN 5-325 MG PO TABS: 2.0000 | ORAL_TABLET | ORAL | Status: DC | PRN

## 2019-12-25 MED ORDER — LACTATED RINGERS IV SOLN: 500.0000 mL | INTRAVENOUS | Status: DC | PRN

## 2019-12-25 MED ORDER — LEVONORGESTREL 19.5 MCG/DAY IU IUD
INTRAUTERINE_SYSTEM | Freq: Once | INTRAUTERINE | Status: AC
  Administered 2019-12-26: 1 via INTRAUTERINE
  Filled 2019-12-25: qty 1

## 2019-12-25 NOTE — Progress Notes (Signed)
Labor Progress Note Jasmin Ellis is a 29 y.o. [id number] at [redacted]w[redacted]d presented for IOL for pre-gestational type 2 DM on insulin. S:  Feeling some contractions every 5-6 minutes, no complaints of pain at this time.  O:  BP (!) 141/73   Pulse 81   Temp 98 F (36.7 C) (Oral)   Resp 18   Ht 5\' 4"  (1.626 m)   Wt 92.5 kg   LMP 03/27/2019 (Exact Date)   BMI 35.02 kg/m  EFM: 135/moderate/no acels/no decls  CVE: Dilation: 4 Effacement (%): 60 Station: -2 Presentation: Vertex Exam by:: Joylene Igo RN   A&P: 29 y.o. [id number] [redacted]w[redacted]d for IOL for pre-gestational type 2 DM on insulin. #Labor: S/p cytotec x 2, expectant management, can consider pitocin on next check #Pain: Analgesia PRN, epidural when indicated #FWB: Cat I #GBS positive #Pre-pregnancy T2DM on insulin: Most recent CBG 91 after starting D5 (for CBG of 50). Consider to monitor q4hr, q2hr when active. #HTN: Most recent BP 141/73. Continue to monitor and can initiate antihypertensives if needed. Earlier BP was 122/74.  Lurline Del, DO 6:18 PM

## 2019-12-25 NOTE — H&P (Addendum)
OBSTETRIC ADMISSION HISTORY AND PHYSICAL  Jasmin Ellis is a 29 y.o. female [phone] with IUP at 10w0dby early u/s presenting for IOL for pregestational type 2 DM on insulin. She reports +FMs, No LOF, no VB, no blurry vision, headaches or peripheral edema, and RUQ pain.  She plans on breast and bottle feeding. She request ppIUD for birth control. She [facility] early u/s --->  Estimated Date of Delivery: 01/01/20  Sono:   '@[redacted]w[redacted]d'$ , CWD, normal anatomy, cephalic presentation, [id number] 80% EFW   Prenatal History/Complications: Pre-existing Type 2 DM GBS carrier Anxiety with depression  Past Medical History: Past Medical History:  Diagnosis Date   Anemia    Anxiety    Back pain, chronic    s/p fall 8 years ago   [place]    Fibroids     Past Surgical History: Past Surgical History:  Procedure Laterality Date   NO PAST SURGERIES      Obstetrical History: OB History     Gravida  4   Para  3   Term  3   Preterm  0   AB  0   Living  3      SAB  0   TAB  0   Ectopic  0   Multiple      Live Births  3           Social History Social History   Socioeconomic History   Marital status: Married    Spouse name: Not on file   Number of children: Not on file   Years of education: Not on file   Highest education level: Not on file  Occupational History   Not on file  Tobacco Use   Smoking status: Never Smoker   Smokeless tobacco: Never Used  Substance and Sexual Activity   Alcohol use: No   Drug use: No   Sexual activity: Yes    Birth control/protection: None  Other Topics Concern   Not on file  Social History Narrative   ** Merged History Encounter **       Social Determinants of Health   Financial Resource Strain:    Difficulty of Paying Living Expenses: Not on file  Food Insecurity:    Worried About RCharity fundraiserin the Last Year: Not on file   RYRC Worldwideof Food  in the Last Year: Not on file  Transportation Needs:    Lack of Transportation (Medical): Not on file   Lack of Transportation (Non-Medical): Not on file  Physical Activity:    Days of Exercise per Week: Not on file   Minutes of Exercise per Session: Not on file  Stress:    Feeling of Stress : Not on file  Social Connections:    Frequency of Communication with Friends and Family: Not on file   Frequency of Social Gatherings with Friends and Family: Not on file   Attends Religious Services: Not on file   Active Member of Clubs or Organizations: Not on file   Attends CArchivistMeetings: Not on file   Marital Status: Not on file    Family History: Family History  Problem Relation Age of Onset   Diabetes Father     Allergies: No Known Allergies  Medications Prior to Admission  Medication Sig Dispense Refill Last Dose   Accu-Chek FastClix Lancets MISC 1 Device by Percutaneous route 4 (four) times daily.  100 each 12    Acetaminophen (TYLENOL PO) Take by mouth.      aspirin 81 MG chewable tablet Chew 1 tablet (81 mg total) by mouth daily. 90 tablet 6    Blood Pressure Monitor KIT 1 Device by Does not apply route daily. ICD 10: Z34.00 1 kit 0    glucose blood (ACCU-CHEK GUIDE) test strip Use as instructed 100 each 12    hydrOXYzine (ATARAX/VISTARIL) 25 MG tablet Take 1 tablet (25 mg total) by mouth 3 (three) times daily as needed for anxiety. 60 tablet 3    insulin NPH Human (HUMULIN N) 100 UNIT/ML injection Inject before breakfast (10 units) and before supper (8 units) 10 mL 3    Insulin Syringe-Needle U-100 30G X 15/64" 0.5 ML MISC 1 Device by Does not apply route as needed. 100 each 6    Prenatal Vit-Fe Fumarate-FA (MULTIVITAMIN-PRENATAL) 27-0.8 MG TABS tablet Take 1 tablet by mouth daily at 12 noon. 30 tablet 12    sertraline (ZOLOFT) 25 MG tablet Take 1 tablet (25 mg total) by mouth daily. 90 tablet 3      Review of Systems   All systems reviewed and negative  except as stated in HPI  Blood pressure 122/74, pulse 93, temperature 98.4 F (36.9 C), temperature source Oral, resp. rate 18, height '5\' 4"'$  (1.626 m), weight 92.5 kg, last menstrual period 03/27/2019. General appearance: alert, cooperative and no distress Lungs: clear to auscultation bilaterally Heart: regular rate and rhythm Abdomen: soft, non-tender; bowel sounds normal Extremities: No sign of DVT Cervical: 0/50/-3 Fetal monitoringBaseline: 145 bpm, Variability: Good {> 6 bpm) and Accelerations: Reactive Uterine activity no contractions Dilation: Closed Effacement (%): 50 Station: -3 Exam by:: Dr. Roseanne Kaufman  Prenatal labs: ABO, Rh: --/--/PENDING (03/10 8127) Antibody: PENDING (03/10 5170) Rubella: 15.70 (08/21 1606) RPR: Non Reactive (12/21 1020)  HBsAg: Negative (08/21 1606)  HIV: Non Reactive (12/21 1020)  GBS: Positive/-- (02/19 0000)  Genetic screening  Declines Anatomy US Normal  Prenatal Transfer Tool  Maternal Diabetes: Yes:  Diabetes Type:  Pre-pregnancy insulin dependent  Genetic Screening: Declined Maternal Ultrasounds/Referrals: Normal Fetal Ultrasounds or other Referrals:  Fetal echo - Patent foramen ovale, patent ductus arteriosus, bidirectional interatrial shunt Maternal Substance Abuse:  No Significant Maternal Medications:  Meds include: Zoloft Other: Insulin  Significant Maternal Lab Results: Group B Strep positive  Results for orders placed or performed during the hospital encounter of 12/25/19 (from the past 24 hour(s))  Glucose, capillary   Collection Time: 12/25/19  9:27 AM  Result Value Ref Range   Glucose-Capillary 149 (H) 70 - 99 mg/dL  CBC   Collection Time: 12/25/19  9:36 AM  Result Value Ref Range   WBC 5.9 4.0 - 10.5 K/uL   RBC 3.81 (L) 3.87 - 5.11 MIL/uL   Hemoglobin 11.3 (L) 12.0 - 15.0 g/dL   HCT 33.6 (L) 36.0 - 46.0 %   MCV 88.2 80.0 - 100.0 fL   MCH 29.7 26.0 - 34.0 pg   MCHC 33.6 30.0 - 36.0 g/dL   RDW 13.2 11.5 - 15.5 %    Platelets 174 150 - 400 K/uL   nRBC 0.0 0.0 - 0.2 %  Type and screen   Collection Time: 12/25/19  9:36 AM  Result Value Ref Range   ABO/RH(D) PENDING    Antibody Screen PENDING    Sample Expiration      12/28/2019,2359 Performed at [facility], 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 [id number] at 69w0dhere for IOL for pregestational type 2 DM on insulin.  #Labor: Cytotec started, can progress to foley balloon when appropriate. #Pain: Analgesia PRN #FWB: Cat 1 #ID: GBS positive- AMP #MOF: Breast/Bottle #MOC: ppIUD- consented and ordered #Circ:  N/A #Pre-pregnancy T2DM on insulin: CBG at bedside was 149, she had just eaten breakfast before arriving. Will recheck around 1000-1030. If still elevated, may need to initiate sliding scale insulin. Otherwise monitor CBGs q4hr, q2hr when active.  RLurline DelDO, PGY-1 12/25/2019, 9:54 AM  GME ATTESTATION:  I saw and evaluated the patient. I agree with the findings and the plan of care as documented in the resident's note with the addition of the following:  Risks and benefits of induction were reviewed, including failure of method, prolonged labor, need for further intervention, risk of cesarean.  Patient and family seem to understand these risks and wish to proceed. Options of cytotec, foley  bulb, AROM, and pitocin reviewed, with use of each discussed.  Anticipate vaginal delivery  Tony Friscia L Kosha Jaquith, DO  12/25/2019, 10:14 AM

## 2019-12-25 NOTE — Progress Notes (Signed)
Labor Progress Note Jasmin Ellis is a 29 y.o. [id number] at [redacted]w[redacted]d presented for IOL for pre-gestational type 2 DM on insulin.  S:  Feeling some contractions. O:  BP (!) 145/84   Pulse 84   Temp 98.3 F (36.8 C) (Oral)   Resp 16   Ht 5\' 4"  (1.626 m)   Wt 92.5 kg   LMP 03/27/2019 (Exact Date)   BMI 35.02 kg/m  EFM: 150/moderate/acels present  CVE: Dilation: 2 Effacement (%): 50 Station: -3 Presentation: Vertex Exam by:: Joylene Igo RN   A&P: 29 y.o. [id number] [redacted]w[redacted]d here for IOL for pre-gestational type 2 DM on insulin. #Labor: Progressing well. S/p cytotec x 1, will give 2nd dose of cytotec #Pain: Analgesia PRN #FWB: Cat I #GBS positive - Amp #Pre-pregnancy T2DM on insulin: Most recent CBG 89. Continue to monitor CBGs q4hr, q2hr when active  [facility], DO 2:02 PM

## 2019-12-25 NOTE — Anesthesia Procedure Notes (Signed)
Epidural Patient location during procedure: OB Start time: 12/25/2019 10:34 PM End time: 12/25/2019 11:38 PM  Staffing Anesthesiologist: Brennan Bailey, MD Performed: anesthesiologist   Preanesthetic Checklist Completed: patient identified, IV checked, risks and benefits discussed, monitors and equipment checked, pre-op evaluation and timeout performed  Epidural Patient position: sitting Prep: DuraPrep and site prepped and draped Patient monitoring: continuous pulse ox, blood pressure and heart rate Approach: midline Location: L3-L4 Injection technique: LOR air  Needle:  Needle type: Tuohy  Needle gauge: 17 G Needle length: 9 cm Needle insertion depth: 7 cm Catheter type: closed end flexible Catheter size: 19 Gauge Catheter at skin depth: 11 cm Test dose: negative and Other (1% lidocaine)  Assessment Events: blood not aspirated, injection not painful, no injection resistance, no paresthesia and negative IV test  Additional Notes Patient identified. Risks, benefits, and alternatives discussed with patient including but not limited to bleeding, infection, nerve damage, paralysis, failed block, incomplete pain control, headache, blood pressure changes, nausea, vomiting, reactions to medication, itching, and postpartum back pain. Confirmed with bedside nurse the patient's most recent platelet count. Confirmed with patient that they are not currently taking any anticoagulation, have any bleeding history, or any family history of bleeding disorders. Patient expressed understanding and wished to proceed. All questions were answered. Sterile technique was used throughout the entire procedure. Please see nursing notes for vital signs.   Crisp LOR after one needle redirection. Test dose was given through epidural catheter and negative prior to continuing to dose epidural or start infusion. Warning signs of high block given to the patient including shortness of breath, tingling/numbness in  hands, complete motor block, or any concerning symptoms with instructions to call for help. Patient was given instructions on fall risk and not to get out of bed. All questions and concerns addressed with instructions to call with any issues or inadequate analgesia.  Reason for block:procedure for pain

## 2019-12-25 NOTE — Progress Notes (Signed)
LABOR PROGRESS NOTE  Jasmin Ellis is a 29 y.o. [id number] at [redacted]w[redacted]d admitted for IOL for pre-gestational DM2 on insulin.  Subjective: Patient is starting to feel more uncomfortable. She is tired and wants to sleep.  Objective: BP 140/83   Pulse 85   Temp 98.3 F (36.8 C) (Oral)   Resp 18   Ht 5\' 4"  (1.626 m)   Wt 92.5 kg   LMP 03/27/2019 (Exact Date)   BMI 35.02 kg/m  or  Vitals:   12/25/19 1702 12/25/19 1759 12/25/19 1900 12/25/19 1920  BP:    140/83  Pulse:    85  Resp: 16 18 18 18   Temp:    98.3 F (36.8 C)  TempSrc:    Oral  Weight:      Height:         Dilation: 4 Effacement (%): 70 Station: Ballotable Presentation: Vertex Exam by:: Dr. Dione Plover FHT: baseline rate 130, moderate varibility, positive acel, no decel Toco: q3-4 min  Labs: Lab Results  Component Value Date   WBC 5.9 12/25/2019   HGB 11.3 (L) 12/25/2019   HCT 33.6 (L) 12/25/2019   MCV 88.2 12/25/2019   PLT 174 12/25/2019    Patient Active Problem List   Diagnosis Date Noted  . 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 GBS (group B Streptococcus carrier), +RV culture, currently pregnant 12/09/2019  . Obesity in pregnancy 07/03/2019  . Dyspepsia 04/12/2018  . Insomnia 03/10/2018  . Anxiety with depression 03/10/2018  . Fatigue 02/13/2018  . Dizziness 01/11/2018  . Supervision of other high risk pregnancies, second trimester 03/08/2015  . Pre-existing type 2 diabetes mellitus during pregnancy in second trimester   . Diabetes mellitus in pregnancy in third trimester 08/05/2014  . Back pain, chronic     Assessment / Plan: 29 y.o. [id number] at [redacted]w[redacted]d here for IOL for pregestational DM2.  Labor: S/p cytotec x2. Starting Pitocin. Fetal Wellbeing:  Category 1 Pain Control:  Analgesia PRN. Plan for epidural GBS: GBS positive - ampicillin Anticipated MOD:  SVD DM2: Has not been hyperglycemic since admission. Started D5 after a CBG 50. Most recent 67. Monitor q4h latent labor and then  q2h when active labor HTN: Most recent BP 140/83. No concerning symptoms for PreE. Normal platelets and LFT's. Pending urine Pr/Cr ratio.  [name], MD OB Resident 12/25/2019, 10:19 PM

## 2019-12-25 NOTE — MAU Note (Signed)
covid swab collected

## 2019-12-25 NOTE — Anesthesia Preprocedure Evaluation (Signed)
Anesthesia Evaluation  Patient identified by MRN, date of birth, ID band Patient awake    Reviewed: Allergy & Precautions, Patient's Chart, lab work & pertinent test results  History of Anesthesia Complications Negative for: history of anesthetic complications  Airway Mallampati: II  TM Distance: >3 FB Neck ROM: Full    Dental no notable dental hx.    Pulmonary neg pulmonary ROS,    Pulmonary exam normal        Cardiovascular negative cardio ROS Normal cardiovascular exam     Neuro/Psych negative neurological ROS  negative psych ROS   GI/Hepatic negative GI ROS, Neg liver ROS,   Endo/Other  diabetes, Type 2  Renal/GU negative Renal ROS  negative genitourinary   Musculoskeletal negative musculoskeletal ROS (+)   Abdominal   Peds  Hematology negative hematology ROS (+)   Anesthesia Other Findings Day of surgery medications reviewed with patient.  Reproductive/Obstetrics (+) Pregnancy                             Anesthesia Physical Anesthesia Plan  ASA: II  Anesthesia Plan: Epidural   Post-op Pain Management:    Induction:   PONV Risk Score and Plan: Treatment may vary due to age or medical condition  Airway Management Planned: Natural Airway  Additional Equipment:   Intra-op Plan:   Post-operative Plan:   Informed Consent: I have reviewed the patients History and Physical, chart, labs and discussed the procedure including the risks, benefits and alternatives for the proposed anesthesia with the patient or authorized representative who has indicated his/her understanding and acceptance.       Plan Discussed with:   Anesthesia Plan Comments:         Anesthesia Quick Evaluation

## 2019-12-26 ENCOUNTER — Encounter (HOSPITAL_COMMUNITY): Payer: Self-pay | Admitting: Obstetrics & Gynecology

## 2019-12-26 ENCOUNTER — Telehealth: Payer: Medicaid Other | Admitting: Obstetrics and Gynecology

## 2019-12-26 DIAGNOSIS — O24113 Pre-existing diabetes mellitus, type 2, in pregnancy, third trimester: Secondary | ICD-10-CM

## 2019-12-26 DIAGNOSIS — Z975 Presence of (intrauterine) contraceptive device: Secondary | ICD-10-CM

## 2019-12-26 DIAGNOSIS — Z30014 Encounter for initial prescription of intrauterine contraceptive device: Secondary | ICD-10-CM

## 2019-12-26 DIAGNOSIS — Z3A39 39 weeks gestation of pregnancy: Secondary | ICD-10-CM

## 2019-12-26 DIAGNOSIS — Z30430 Encounter for insertion of intrauterine contraceptive device: Secondary | ICD-10-CM

## 2019-12-26 DIAGNOSIS — O139 Gestational [pregnancy-induced] hypertension without significant proteinuria, unspecified trimester: Secondary | ICD-10-CM

## 2019-12-26 LAB — TYPE AND SCREEN
ABO/RH(D): O POS
Antibody Screen: NEGATIVE

## 2019-12-26 LAB — GLUCOSE, CAPILLARY
Glucose-Capillary: 65 mg/dL — ABNORMAL LOW (ref 70–99)
Glucose-Capillary: 58 mg/dL — ABNORMAL LOW (ref 70–99)
Glucose-Capillary: 75 mg/dL (ref 70–99)
Glucose-Capillary: 87 mg/dL (ref 70–99)

## 2019-12-26 MED ORDER — OXYCODONE HCL 5 MG PO TABS: 5.0000 mg | ORAL_TABLET | ORAL | Status: DC | PRN

## 2019-12-26 MED ORDER — METHYLERGONOVINE MALEATE 0.2 MG/ML IJ SOLN: 0.2000 mg | INTRAMUSCULAR | Status: DC | PRN

## 2019-12-26 MED ORDER — WITCH HAZEL-GLYCERIN EX PADS: 1.0000 "application " | MEDICATED_PAD | CUTANEOUS | Status: DC | PRN

## 2019-12-26 MED ORDER — METFORMIN HCL 500 MG PO TABS
500.0000 mg | ORAL_TABLET | Freq: Two times a day (BID) | ORAL | Status: DC
  Administered 2019-12-26 – 2019-12-27 (×2): 500 mg via ORAL
  Filled 2019-12-26 (×2): qty 1

## 2019-12-26 MED ORDER — METHYLERGONOVINE MALEATE 0.2 MG PO TABS: 0.2000 mg | ORAL_TABLET | ORAL | Status: DC | PRN

## 2019-12-26 MED ORDER — ONDANSETRON HCL 4 MG PO TABS: 4.0000 mg | ORAL_TABLET | ORAL | Status: DC | PRN

## 2019-12-26 MED ORDER — BENZOCAINE-MENTHOL 20-0.5 % EX AERO
1.0000 "application " | INHALATION_SPRAY | CUTANEOUS | Status: DC | PRN
  Administered 2019-12-26: 1 via TOPICAL
  Filled 2019-12-26: qty 56

## 2019-12-26 MED ORDER — SIMETHICONE 80 MG PO CHEW: 80.0000 mg | CHEWABLE_TABLET | ORAL | Status: DC | PRN

## 2019-12-26 MED ORDER — TETANUS-DIPHTH-ACELL PERTUSSIS 5-2.5-18.5 LF-MCG/0.5 IM SUSP: 0.5000 mL | Freq: Once | INTRAMUSCULAR | Status: DC

## 2019-12-26 MED ORDER — LACTATED RINGERS IV SOLN: 500.0000 mL | Freq: Once | INTRAVENOUS | Status: DC

## 2019-12-26 MED ORDER — DIBUCAINE (PERIANAL) 1 % EX OINT: 1.0000 "application " | TOPICAL_OINTMENT | CUTANEOUS | Status: DC | PRN

## 2019-12-26 MED ORDER — IBUPROFEN 600 MG PO TABS
600.0000 mg | ORAL_TABLET | Freq: Four times a day (QID) | ORAL | Status: DC
  Administered 2019-12-26 – 2019-12-27 (×5): 600 mg via ORAL
  Filled 2019-12-26 (×5): qty 1

## 2019-12-26 MED ORDER — ONDANSETRON HCL 4 MG/2ML IJ SOLN: 4.0000 mg | INTRAMUSCULAR | Status: DC | PRN

## 2019-12-26 MED ORDER — PRENATAL MULTIVITAMIN CH
1.0000 | ORAL_TABLET | Freq: Every day | ORAL | Status: DC
  Administered 2019-12-26 – 2019-12-27 (×2): 1 via ORAL
  Filled 2019-12-26 (×2): qty 1

## 2019-12-26 MED ORDER — DIPHENHYDRAMINE HCL 25 MG PO CAPS: 25.0000 mg | ORAL_CAPSULE | Freq: Four times a day (QID) | ORAL | Status: DC | PRN

## 2019-12-26 MED ORDER — COCONUT OIL OIL: 1.0000 "application " | TOPICAL_OIL | Status: DC | PRN

## 2019-12-26 MED ORDER — OXYTOCIN 40 UNITS IN NORMAL SALINE INFUSION - SIMPLE MED: 2.5000 [IU]/h | INTRAVENOUS | Status: DC | PRN

## 2019-12-26 MED ORDER — ACETAMINOPHEN 325 MG PO TABS
650.0000 mg | ORAL_TABLET | ORAL | Status: DC | PRN
  Administered 2019-12-27: 650 mg via ORAL
  Filled 2019-12-26: qty 2

## 2019-12-26 MED ORDER — OXYCODONE HCL 5 MG PO TABS: 10.0000 mg | ORAL_TABLET | ORAL | Status: DC | PRN

## 2019-12-26 NOTE — Lactation Note (Signed)
This note was copied from a baby's chart. Lactation Consultation Note  Patient Name: Jasmin Ellis [id number] Date: 12/26/2019 Reason for consult: Initial assessment;Term P4.  Mom both breast and formula fed her previous babies.  Newborn is 18 hours old and has breastfed once and formula fed once.  Instructed to feed with cues and always put baby to breast prior to giving formula.  Mom undressed sleepy baby for feeding.  Baby latched easily and active suck/swallow observed.  Encouraged to call for assist prn.  Breastfeeding consultation services information given for review.  Maternal Data    Feeding Feeding Type: Breast Fed  LATCH Score Latch: Grasps breast easily, tongue down, lips flanged, rhythmical sucking.  Audible Swallowing: A few with stimulation  Type of Nipple: Everted at rest and after stimulation  Comfort (Breast/Nipple): Soft / non-tender  Hold (Positioning): Assistance needed to correctly position infant at breast and maintain latch.  LATCH Score: 8  Interventions    Lactation Tools Discussed/Used     Consult Status Consult Status: Follow-up Date: 12/27/19 Follow-up type: In-patient    Ave Filter 12/26/2019, 2:05 PM

## 2019-12-26 NOTE — Progress Notes (Signed)
LABOR PROGRESS NOTE  Jasmin Ellis is a 29 y.o. [id number] at [redacted]w[redacted]d admitted for IOL for pre-gestational DM2 on insulin.  Subjective: Patient feels better with the epidural.   Objective: BP 138/79   Pulse 72   Temp 98 F (36.7 C) (Oral)   Resp 18   Ht 5\' 4"  (1.626 m)   Wt 92.5 kg   LMP 03/27/2019 (Exact Date)   SpO2 100%   BMI 35.02 kg/m  or  Vitals:   12/25/19 2312 12/25/19 2332 12/26/19 0002 12/26/19 0100  BP: 130/77 129/81 131/79 138/79  Pulse: 73 78 69 72  Resp: 18 18 18 18   Temp:      TempSrc:      SpO2: 100%   100%  Weight:      Height:         Dilation: 4 Effacement (%): 80 Station: -3 Presentation: Vertex Exam by:: Dr. Dione Plover FHT: baseline rate 125, borderline moderate varibility, positive acel, no decel Toco: q3-4 min  Labs: Lab Results  Component Value Date   WBC 5.9 12/25/2019   HGB 11.3 (L) 12/25/2019   HCT 33.6 (L) 12/25/2019   MCV 88.2 12/25/2019   PLT 174 12/25/2019    Patient Active Problem List   Diagnosis Date Noted  . 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 GBS (group B Streptococcus carrier), +RV culture, currently pregnant 12/09/2019  . Obesity in pregnancy 07/03/2019  . Dyspepsia 04/12/2018  . Insomnia 03/10/2018  . Anxiety with depression 03/10/2018  . Fatigue 02/13/2018  . Dizziness 01/11/2018  . Supervision of other high risk pregnancies, second trimester 03/08/2015  . Pre-existing type 2 diabetes mellitus during pregnancy in second trimester   . Diabetes mellitus in pregnancy in third trimester 08/05/2014  . Back pain, chronic     Assessment / Plan: 29 y.o. [id number] at [redacted]w[redacted]d here for IOL for pregestational DM2.  Labor: S/p cytotec x2. Continue Pitocin. SROM during cervical check @0105  with copious amounts of clear fluid.  Fetal Wellbeing:  Category 1 Pain Control:  Epidural GBS: GBS positive - ampicillin Anticipated MOD:  SVD DM2: Has not been hyperglycemic since admission. Started D5 after a CBG 50. Most  recent 62. Monitor q4h latent labor and then q2h when active labor HTN: Most recent BP 138/79. No concerning symptoms for PreE. Normal platelets and LFT's. Urine Pr/Cr ratio normal.  [name], MD OB Resident 12/26/2019, 1:13 AM

## 2019-12-26 NOTE — Progress Notes (Signed)
Upon entering room, RN noticed MOB walking around room.  RN educated pt at [facility] admission about the protocol for calling for supervision and assistance for ambulation post-epidural.  Pt ambulated and did independent peri care without staff.  Pt had no issues, gait is steady.

## 2019-12-26 NOTE — Procedures (Signed)
  Post-Placental IUD Insertion Procedure Note  Patient identified, informed consent signed prior to delivery, signed copy in chart, time out was performed.    Vaginal, labial and perineal areas thoroughly inspected for lacerations. 1st degree laceration identified, hemostatic, not repaired prior to insertion of Liletta IUD.    - IUD grasped between sterile gloved fingers. Sterile lubrication applied to sterile gloved hand for ease of insertion. Fundus identified through abdominal wall using non-insertion hand. IUD inserted to fundus with bimanual technique. IUD carefully released at the fundus and insertion hand gently removed from vagina.    Strings trimmed to the level of the introitus. Patient tolerated procedure well.  Lot # [id number] Date09/08/2023  Patient given post procedure instructions and IUD care card with expiration date.  Patient is asked to keep IUD strings tucked in her vagina until her postpartum follow up visit in 4-6 weeks. Patient advised to abstain from sexual intercourse and pulling on strings before her follow-up visit. Patient verbalized an understanding of the plan of care and agrees.

## 2019-12-26 NOTE — Anesthesia Postprocedure Evaluation (Signed)
Anesthesia Post Note  Patient: Jasmin Ellis  Procedure(s) Performed: AN AD HOC LABOR EPIDURAL     Patient location during evaluation: Mother Baby Anesthesia Type: Epidural Level of consciousness: awake, awake and alert and oriented Pain management: pain level controlled Vital Signs Assessment: post-procedure vital signs reviewed and stable Respiratory status: spontaneous breathing and respiratory function stable Cardiovascular status: blood pressure returned to baseline Postop Assessment: no headache, epidural receding, patient able to bend at knees, adequate PO intake, no backache, no apparent nausea or vomiting and able to ambulate Anesthetic complications: no    Last Vitals:  Vitals:   12/26/19 0740 12/26/19 0851  BP: (!) 135/91 (!) 140/92  Pulse: 73 77  Resp: 16 16  Temp: 36.8 C 36.7 C  SpO2: 100%     Last Pain:  Vitals:   12/26/19 1140  TempSrc:   PainSc: 0-No pain   Pain Goal:                   Bufford Spikes

## 2019-12-26 NOTE — Discharge Summary (Addendum)
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] Intrauterine pregnancy: [redacted]w[redacted]d    Secondary diagnosis:  Active Problems:   Supervision of other high risk pregnancies, second trimester   Diabetes mellitus in pregnancy in third trimester   Diabetes 1.5, managed as type 1 ([place]   IUD (intrauterine device) in place   Gestational hypertension  Additional problems: None     Discharge diagnosis: Term Pregnancy Delivered, Gestational Hypertension and Type 2 DM                                                                                                Post partum procedures: Post placental Liletta IUD placement  Augmentation: AROM, Pitocin and Cytotec  Complications: None  Hospital course:  Induction of Labor With Vaginal Delivery   29y.o. yo [id number] [id number] admitted to the hospital 12/25/2019 for induction of labor.  Indication for induction: TYPE 2 DM.  Patient had an uncomplicated labor course as follows: induced with misoprostol, pitocin, and AROM and progressed to complete and uncomplicated NSVD. Also had post-placental Liletta IUD placed.  Membrane Rupture Time/Date: 1:05 AM ,12/26/2019   Intrapartum Procedures: Episiotomy: None [1]                                         Lacerations:  None [1]  Patient had delivery of a Viable infant.  Information for the patient's newborn:  TcPalmira, Stickleirl Gavin [[id number]]Delivery Method: Vag-Spont    12/26/2019  Details of delivery can be found in separate delivery note.  Patient had a routine postpartum course. BP's were slightly elevated with systolics in the 13570Vand enalapril was started at '5mg'$ /day. Fasting AM glucose was 122. Patient was started on metformin '500mg'$  BID post partum (previously on no medications prior  to pregnancy). Patient is discharged home 12/27/19. Delivery time: 5:04 AM    Magnesium Sulfate received: No BMZ received: No Rhophylac:N/A MMR:N/A Transfusion:No  Physical exam  Vitals:   12/26/19 1725 12/26/19 2145 12/27/19 0517 12/27/19 0940  BP: 125/71 (!) 139/95 135/89 120/70  Pulse: 83 95 85   Resp: '16 15 16   '$ Temp: (!) 97.5 F (36.4 C) 98 F (36.7 C) 98.4 F (36.9 C)   TempSrc: Oral Oral    SpO2: 99% 99% 100%   Weight:      Height:       General: alert, cooperative and no distress Lochia: appropriate Uterine Fundus: firm Incision: N/A DVT Evaluation: No evidence of DVT seen on physical exam. Labs: Lab Results  Component Value Date   WBC 10.5 12/27/2019   HGB 10.2 (L) 12/27/2019   HCT 30.1 (L) 12/27/2019   MCV 89.1 12/27/2019   PLT 195 12/27/2019   CMP Latest Ref Rng & Units 12/25/2019  Glucose 70 - 99 mg/dL 104(H)  BUN 6 -  20 mg/dL <5(L)  Creatinine 0.44 - 1.00 mg/dL 0.54  Sodium 135 - 145 mmol/L 134(L)  Potassium 3.5 - 5.1 mmol/L 4.3  Chloride 98 - 111 mmol/L 105  CO2 22 - 32 mmol/L 22  Calcium 8.9 - 10.3 mg/dL 9.3  Total Protein 6.5 - 8.1 g/dL 6.2(L)  Total Bilirubin 0.3 - 1.2 mg/dL 0.4  Alkaline Phos 38 - 126 U/L 84  AST 15 - 41 U/L 23  ALT 0 - 44 U/L 13   Edinburgh Score: No flowsheet data found.  Discharge instruction: per After Visit Summary and "Baby and Me Booklet".  After visit meds:  Allergies as of 12/27/2019   No Known Allergies      Medication List     TAKE these medications    Accu-Chek FastClix Lancets Misc 1 Device by Percutaneous route 4 (four) times daily.   Accu-Chek Guide test strip Generic drug: glucose blood Use as instructed   acetaminophen 325 MG tablet Commonly known as: TYLENOL Take 650 mg by mouth every 6 (six) hours as needed for mild pain or headache.   aspirin 81 MG chewable tablet Chew 1 tablet (81 mg total) by mouth daily.   Blood Pressure Monitor Kit 1 Device by Does not apply route daily. ICD  10: Z34.00   enalapril 5 MG tablet Commonly known as: VASOTEC Take 1 tablet (5 mg total) by mouth daily.   hydrOXYzine 25 MG tablet Commonly known as: ATARAX/VISTARIL Take 1 tablet (25 mg total) by mouth 3 (three) times daily as needed for anxiety.   ibuprofen 600 MG tablet Commonly known as: ADVIL Take 1 tablet (600 mg total) by mouth every 6 (six) hours.   insulin NPH Human 100 UNIT/ML injection Commonly known as: HumuLIN N Inject before breakfast (10 units) and before supper (8 units)   Insulin Syringe-Needle U-100 30G X 15/64" 0.5 ML Misc 1 Device by Does not apply route as needed.   metFORMIN 500 MG tablet Commonly known as: GLUCOPHAGE Take 1 tablet (500 mg total) by mouth 2 (two) times daily with a meal.   multivitamin-prenatal 27-0.8 MG Tabs tablet Take 1 tablet by mouth daily at 12 noon.   sertraline 25 MG tablet Commonly known as: Zoloft Take 1 tablet (25 mg total) by mouth daily.        Diet: carb modified diet  Activity: Advance as tolerated. Pelvic rest for 6 weeks.   Outpatient follow up:4 weeks Follow up Appt: Future Appointments  Date Time Provider [place]  01/02/2020  2:00 PM [place]  01/31/2020  8:20 AM [facility]  01/31/2020  9:15 AM Hillard Danker Myles Rosenthal, PA-C [facility]   Follow up Visit:    Please schedule this patient for Postpartum visit in: 4 weeks with the following provider: Any provider In-Person For C/S patients schedule nurse incision check in weeks 2 weeks: no High risk pregnancy complicated by: [id number], gHTN Delivery mode:  SVD Anticipated Birth Control:  PP IUD Placed PP Procedures needed: BP check, 2hr GTT, IUD string check  Schedule Integrated BH visit: no   Newborn Data: Live born female  Birth Weight:  3836g APGAR: 22, 9  Newborn Delivery   Birth date/time: 12/26/2019 05:04:00 Delivery type: Vaginal, Spontaneous      Baby Feeding: Breast Disposition:home with  mother   12/27/2019 Lurline Del, DO  I personally saw and evaluated the patient, performing the key elements of the service. I developed and verified the management plan that is described in  the resident's/student's note, and I agree with the content with my edits above. VSS, HRR&R, Resp unlabored, Legs neg.  Nigel Berthold, CNM 12/29/2019 10:55 AM

## 2019-12-26 NOTE — Progress Notes (Signed)
Hypoglycemic Event  CBG: 58     Treatment: 8 oz of apple juice  Symptoms: asymptomatic  Follow-up CBG: Time:0258 CBG Result:75  Possible Reasons for Event: clear liquids  Comments/MD notified: Dr. Dione Plover notified at 0244 of CBG of 58. Dr. Dione Plover notified of follow CBG as well    Mathis Dad

## 2019-12-27 LAB — CBC
HCT: 30.1 % — ABNORMAL LOW (ref 36.0–46.0)
Hemoglobin: 10.2 g/dL — ABNORMAL LOW (ref 12.0–15.0)
MCH: 30.2 pg (ref 26.0–34.0)
MCHC: 33.9 g/dL (ref 30.0–36.0)
MCV: 89.1 fL (ref 80.0–100.0)
Platelets: 195 10*3/uL (ref 150–400)
RBC: 3.38 MIL/uL — ABNORMAL LOW (ref 3.87–5.11)
RDW: 13.3 % (ref 11.5–15.5)
WBC: 10.5 10*3/uL (ref 4.0–10.5)
nRBC: 0 % (ref 0.0–0.2)

## 2019-12-27 LAB — GLUCOSE, CAPILLARY: Glucose-Capillary: 122 mg/dL — ABNORMAL HIGH (ref 70–99)

## 2019-12-27 MED ORDER — ENALAPRIL MALEATE 5 MG PO TABS
5.0000 mg | ORAL_TABLET | Freq: Every day | ORAL | Status: DC
  Administered 2019-12-27: 5 mg via ORAL
  Filled 2019-12-27: qty 1

## 2019-12-27 MED ORDER — IBUPROFEN 600 MG PO TABS: 600.0000 mg | ORAL_TABLET | Freq: Four times a day (QID) | ORAL | 0 refills | Status: DC

## 2019-12-27 MED ORDER — METFORMIN HCL 500 MG PO TABS: 500.0000 mg | ORAL_TABLET | Freq: Two times a day (BID) | ORAL | 1 refills | Status: DC

## 2019-12-27 MED ORDER — ENALAPRIL MALEATE 5 MG PO TABS: 5.0000 mg | ORAL_TABLET | Freq: Every day | ORAL | 2 refills | Status: DC

## 2019-12-27 NOTE — Progress Notes (Signed)
MOB was referred for history of depression/anxiety. * Referral screened out by Clinical Social Worker because none of the following criteria appear to apply: ~ History of anxiety/depression during this pregnancy, or of post-partum depression following prior delivery. ~ Diagnosis of anxiety and/or depression within last 3 years OR * MOB's symptoms currently being treated with medication and/or therapy. Per chart review, MOB's H&P and infants H&P report that MOB is currently taking Zoloft & Atarax during this pregnancy.     Please contact the Clinical Social Worker if needs arise, by [facility] request, or if MOB scores greater than 9/yes to question 10 on Edinburgh Postpartum Depression Screen.     Virgie Dad Morton Simson, MSW, LCSW [facility] [place] [phone]

## 2019-12-27 NOTE — Discharge Instructions (Signed)

## 2020-01-01 ENCOUNTER — Emergency Department (HOSPITAL_COMMUNITY)
Admission: EM | Admit: 2020-01-01 | Discharge: 2020-01-01 | Disposition: A | Discharge status: Home / Self Care | Payer: Medicaid Other | Attending: Emergency Medicine | Admitting: Emergency Medicine

## 2020-01-01 ENCOUNTER — Encounter (HOSPITAL_COMMUNITY): Payer: Self-pay | Admitting: Emergency Medicine

## 2020-01-01 ENCOUNTER — Other Ambulatory Visit: Payer: Self-pay

## 2020-01-01 ENCOUNTER — Emergency Department (HOSPITAL_COMMUNITY): Payer: Medicaid Other

## 2020-01-01 DIAGNOSIS — Z7982 Long term (current) use of aspirin: Secondary | ICD-10-CM | POA: Insufficient documentation

## 2020-01-01 DIAGNOSIS — R002 Palpitations: Secondary | ICD-10-CM | POA: Insufficient documentation

## 2020-01-01 DIAGNOSIS — Z20822 Contact with and (suspected) exposure to covid-19: Secondary | ICD-10-CM | POA: Insufficient documentation

## 2020-01-01 DIAGNOSIS — R42 Dizziness and giddiness: Secondary | ICD-10-CM | POA: Diagnosis present

## 2020-01-01 DIAGNOSIS — Z79899 Other long term (current) drug therapy: Secondary | ICD-10-CM | POA: Insufficient documentation

## 2020-01-01 DIAGNOSIS — E119 Type 2 diabetes mellitus without complications: Secondary | ICD-10-CM | POA: Insufficient documentation

## 2020-01-01 DIAGNOSIS — Z794 Long term (current) use of insulin: Secondary | ICD-10-CM | POA: Diagnosis not present

## 2020-01-01 DIAGNOSIS — I10 Essential (primary) hypertension: Secondary | ICD-10-CM | POA: Insufficient documentation

## 2020-01-01 LAB — URINALYSIS, ROUTINE W REFLEX MICROSCOPIC
Bilirubin Urine: NEGATIVE
Glucose, UA: NEGATIVE mg/dL
Ketones, ur: NEGATIVE mg/dL
Nitrite: NEGATIVE
Protein, ur: 100 mg/dL — AB
RBC / HPF: >50 RBC/hpf — ABNORMAL HIGH (ref 0–5)
Specific Gravity, Urine: 1.013 (ref 1.005–1.030)
pH: 7 (ref 5.0–8.0)

## 2020-01-01 LAB — BASIC METABOLIC PANEL
Anion gap: 7 (ref 5–15)
BUN: 16 mg/dL (ref 6–20)
CO2: 24 mmol/L (ref 22–32)
Calcium: 9.8 mg/dL (ref 8.9–10.3)
Chloride: 110 mmol/L (ref 98–111)
Creatinine, Ser: 0.6 mg/dL (ref 0.44–1.00)
GFR calc Af Amer: >60 mL/min (ref >60)
GFR calc non Af Amer: >60 mL/min (ref >60)
Glucose, Bld: 105 mg/dL — ABNORMAL HIGH (ref 70–99)
Potassium: 3.9 mmol/L (ref 3.5–5.1)
Sodium: 141 mmol/L (ref 135–145)

## 2020-01-01 LAB — CBC
HCT: 33 % — ABNORMAL LOW (ref 36.0–46.0)
Hemoglobin: 10.8 g/dL — ABNORMAL LOW (ref 12.0–15.0)
MCH: 29.8 pg (ref 26.0–34.0)
MCHC: 32.7 g/dL (ref 30.0–36.0)
MCV: 90.9 fL (ref 80.0–100.0)
Platelets: 311 10*3/uL (ref 150–400)
RBC: 3.63 MIL/uL — ABNORMAL LOW (ref 3.87–5.11)
RDW: 13.2 % (ref 11.5–15.5)
WBC: 8.1 10*3/uL (ref 4.0–10.5)
nRBC: 0 % (ref 0.0–0.2)

## 2020-01-01 LAB — HEPATIC FUNCTION PANEL
ALT: 23 U/L (ref 0–44)
AST: 19 U/L (ref 15–41)
Albumin: 3.2 g/dL — ABNORMAL LOW (ref 3.5–5.0)
Alkaline Phosphatase: 81 U/L (ref 38–126)
Bilirubin, Direct: <0.1 mg/dL (ref 0.0–0.2)
Total Bilirubin: 0.4 mg/dL (ref 0.3–1.2)
Total Protein: 6.5 g/dL (ref 6.5–8.1)

## 2020-01-01 LAB — CK: Total CK: 79 U/L (ref 38–234)

## 2020-01-01 LAB — TROPONIN I (HIGH SENSITIVITY): Troponin I (High Sensitivity): 16 ng/L (ref <18)

## 2020-01-01 LAB — MAGNESIUM: Magnesium: 1.7 mg/dL (ref 1.7–2.4)

## 2020-01-01 LAB — CBG MONITORING, ED: Glucose-Capillary: 94 mg/dL (ref 70–99)

## 2020-01-01 MED ORDER — HYDROCHLOROTHIAZIDE 25 MG PO TABS: 25.0000 mg | ORAL_TABLET | Freq: Every day | ORAL | 0 refills | Status: DC

## 2020-01-01 MED ORDER — SODIUM CHLORIDE 0.9 % IV BOLUS
500.0000 mL | Freq: Once | INTRAVENOUS | Status: AC
  Administered 2020-01-01: 500 mL via INTRAVENOUS

## 2020-01-01 MED ORDER — IOHEXOL 350 MG/ML SOLN
100.0000 mL | Freq: Once | INTRAVENOUS | Status: AC | PRN
  Administered 2020-01-01: 100 mL via INTRAVENOUS

## 2020-01-01 MED ORDER — LABETALOL HCL 5 MG/ML IV SOLN
5.0000 mg | Freq: Once | INTRAVENOUS | Status: AC
  Administered 2020-01-01: 5 mg via INTRAVENOUS
  Filled 2020-01-01: qty 4

## 2020-01-01 MED ORDER — ENALAPRIL MALEATE 10 MG PO TABS: 10.0000 mg | ORAL_TABLET | Freq: Every day | ORAL | 0 refills | Status: DC

## 2020-01-01 MED ORDER — SODIUM CHLORIDE (PF) 0.9 % IJ SOLN
INTRAMUSCULAR | Status: AC
  Filled 2020-01-01: qty 50

## 2020-01-01 NOTE — Discharge Instructions (Addendum)
You have been diagnosed today with Hypertension.  At this time there does not appear to be the presence of an emergent medical condition, however there is always the potential for conditions to change. Please read and follow the below instructions.  Please return to the Emergency Department immediately for any new or worsening symptoms or if your symptoms do not improve within 2 days. Please be sure to follow up with your Primary Care Provider within one week regarding your visit today; please call their office to schedule an appointment even if you are feeling better for a follow-up visit. Please take your new blood pressure medications as prescribed.  Your enalapril has been increased to 10 mg and you have been started on a new medication called hydrochlorothiazide 25 mg.  Please call your OB/GYN and primary care provider tomorrow morning to schedule follow-up appointments for blood pressure recheck and repeat lab work. Your Covid test is currently pending, check your results on your MyChart account in the next 1-2 days and discuss results with your primary care provider at your follow-up visit. Your CT scan today showed incidental finding of a soft tissue density in the anterior mediastinum consistent with residual or recurrent thymus.  Additionally few axillary lymph nodes.  A single nodule in the left upper lobe of your lung.  Please discuss these incidental findings with your primary care provider at your follow-up visit. Your urine was sent for culture today.  If it grows out bacteria that appear to cause infection you will be contacted by [facility] health with an antibiotic prescription.  Get help right away if: You suddenly develop swelling in your hands, ankles, or face. You have sudden, rapid weight gain. You develop difficulty breathing, chest pain, racing heartbeat, or heart palpitations. You develop severe pain in your abdomen. You have any symptoms of a stroke. "BE FAST" is an easy way to  remember the main warning signs of a stroke: B - Balance. Signs are dizziness, sudden trouble walking, or loss of balance. E - Eyes. Signs are trouble seeing or a sudden change in vision. F - Face. Signs are sudden weakness or numbness of the face, or the face or eyelid drooping on one side. A - Arms. Signs are weakness or numbness in an arm. This happens suddenly and usually on one side of the body. S - Speech. Signs are sudden trouble speaking, slurred speech, or trouble understanding what people say. T - Time. Time to call emergency services. Write down what time symptoms started. You have other signs of a stroke, such as: A sudden, severe headache with no known cause. Nausea or vomiting. Seizure. You have any new/concerning or worsening of symptoms  Please read the additional information packets attached to your discharge summary.  Do not take your medicine if  develop an itchy rash, swelling in your mouth or lips, or difficulty breathing; call 911 and seek immediate emergency medical attention if this occurs.  Note: Portions of this text may have been transcribed using voice recognition software. Every effort was made to ensure accuracy; however, inadvertent computerized transcription errors may still be present.

## 2020-01-01 NOTE — ED Provider Notes (Addendum)
[facility] DEPT Provider Note   CSN: [id number] Arrival date & time: 01/01/20  1715     History Chief Complaint  Patient presents with  . Dizziness    Jasmin Ellis is a 29 y.o. female history of fibroids, diabetes, hypertension, 6 days status post vaginal delivery presents today for palpitations, lightheadedness, shortness of breath onset around 3 PM today.   Patient describes heart racing sensation onset around 3 PM today lasting approximately 15 minutes, severe in intensity gradually improving, now reported as mild.  Was associated with shortness of breath described as difficulty catching her breath, denies any associated chest pain.  She reports that when this occurred she felt very lightheaded and thought that she may pass out.  She reports that she has never felt similar in the past.  Associated mild bilateral lower extremity edema that patient reports has been unchanged since pregnancy.  Denies recent illness, fever/chills, headache, neck pain, chest pain, cough/hemoptysis, vomiting, diarrhea, dysuria/hematuria or any additional concerns.  HPI     Past Medical History:  Diagnosis Date  . Anemia   . Anxiety   . Back pain, chronic    s/p fall 8 years ago  . Diabetes mellitus without complication ([place])   . Fibroids     Patient Active Problem List   Diagnosis Date Noted  . IUD (intrauterine device) in place 12/26/2019  . Gestational hypertension 12/26/2019  . 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 GBS (group B Streptococcus carrier), +RV culture, currently pregnant 12/09/2019  . Obesity in pregnancy 07/03/2019  . Dyspepsia 04/12/2018  . Insomnia 03/10/2018  . Anxiety with depression 03/10/2018  . Fatigue 02/13/2018  . Dizziness 01/11/2018  . Supervision of other high risk pregnancies, second trimester 03/08/2015  . Pre-existing type 2 diabetes mellitus during pregnancy in second trimester   . Diabetes mellitus in  pregnancy in third trimester 08/05/2014  . Back pain, chronic     Past Surgical History:  Procedure Laterality Date  . NO PAST SURGERIES       OB History    Gravida  4   Para  4   Term  4   Preterm  0   AB  0   Living  4     SAB  0   TAB  0   Ectopic  0   Multiple  0   Live Births  4           Family History  Problem Relation Age of Onset  . Diabetes Father     Social History   Tobacco Use  . Smoking status: Never Smoker  . Smokeless tobacco: Never Used  Substance Use Topics  . Alcohol use: No  . Drug use: No    Home Medications Prior to Admission medications   Medication Sig Start Date End Date Taking? Authorizing Provider  acetaminophen (TYLENOL) 325 MG tablet Take 650 mg by mouth every 6 (six) hours as needed for mild pain or headache.   Yes [provider]  ibuprofen (ADVIL) 600 MG tablet Take 1 tablet (600 mg total) by mouth every 6 (six) hours. 12/27/19  Yes Lurline Del, DO  metFORMIN (GLUCOPHAGE) 500 MG tablet Take 1 tablet (500 mg total) by mouth 2 (two) times daily with a meal. 12/27/19  Yes Welborn, Ryan, DO  Prenatal Vit-Fe Fumarate-FA (MULTIVITAMIN-PRENATAL) 27-0.8 MG TABS tablet Take 1 tablet by mouth daily at 12 noon. 07/03/19  Yes Emily Filbert, MD  sertraline (ZOLOFT)  25 MG tablet Take 1 tablet (25 mg total) by mouth daily. Patient taking differently: Take 25 mg by mouth daily as needed (depression).  11/05/19  Yes Anyanwu, Sallyanne Havers, MD  Accu-Chek FastClix Lancets MISC 1 Device by Percutaneous route 4 (four) times daily. 07/03/19   Emily Filbert, MD  aspirin 81 MG chewable tablet Chew 1 tablet (81 mg total) by mouth daily. Patient not taking: Reported on 01/01/2020 07/03/19   Emily Filbert, MD  Blood Pressure Monitor KIT 1 Device by Does not apply route daily. ICD 10: Z34.00 07/18/19   Guss Bunde, MD  enalapril (VASOTEC) 10 MG tablet Take 1 tablet (10 mg total) by mouth daily for 20 days. 01/01/20 01/21/20  Nuala Alpha A, PA-C    glucose blood (ACCU-CHEK GUIDE) test strip Use as instructed 08/07/19   Sloan Leiter, MD  hydrochlorothiazide (HYDRODIURIL) 25 MG tablet Take 1 tablet (25 mg total) by mouth daily. 01/01/20   Nuala Alpha A, PA-C  hydrOXYzine (ATARAX/VISTARIL) 25 MG tablet Take 1 tablet (25 mg total) by mouth 3 (three) times daily as needed for anxiety. Patient not taking: Reported on 12/26/2019 11/05/19   Anyanwu, Sallyanne Havers, MD  insulin NPH Human (HUMULIN N) 100 UNIT/ML injection Inject before breakfast (10 units) and before supper (8 units) Patient not taking: Reported on 01/01/2020 11/19/19   Aletha Halim, MD  Insulin Syringe-Needle U-100 30G X 15/64" 0.5 ML MISC 1 Device by Does not apply route as needed. 07/25/19   Aletha Halim, MD    Allergies    Patient has no known allergies.  Review of Systems   Review of Systems Ten systems are reviewed and are negative for acute change except as noted in the HPI  Physical Exam Updated Vital Signs BP (!) 157/93   Pulse 65   Temp 99.2 F (37.3 C) (Oral)   Resp 15   SpO2 100%   Physical Exam Constitutional:      General: She is not in acute distress.    Appearance: Normal appearance. She is well-developed. She is not ill-appearing or diaphoretic.  HENT:     Head: Normocephalic and atraumatic.     Right Ear: External ear normal.     Left Ear: External ear normal.     Nose: Nose normal.  Eyes:     General: Vision grossly intact. Gaze aligned appropriately.     Pupils: Pupils are equal, round, and reactive to light.  Neck:     Trachea: Trachea and phonation normal. No tracheal deviation.  Cardiovascular:     Rate and Rhythm: Normal rate.     Pulses: Normal pulses.     Heart sounds: Normal heart sounds.  Pulmonary:     Effort: Pulmonary effort is normal. No respiratory distress.     Breath sounds: Normal breath sounds.  Abdominal:     General: There is no distension.     Palpations: Abdomen is soft.     Tenderness: There is no abdominal  tenderness. There is no guarding or rebound.  Musculoskeletal:        General: Normal range of motion.     Cervical back: Normal range of motion.     Right lower leg: 1+ Edema present.     Left lower leg: 1+ Edema present.  Skin:    General: Skin is warm and dry.  Neurological:     Mental Status: She is alert.     GCS: GCS eye subscore is 4. GCS verbal subscore is  5. GCS motor subscore is 6.     Comments: Speech is clear and goal oriented, follows commands Major Cranial nerves without deficit, no facial droop Normal strength in upper and lower extremities bilaterally including dorsiflexion and plantar flexion, strong and equal grip strength Sensation normal to light and sharp touch Moves extremities without ataxia, coordination intact Normal finger to nose and rapid alternating movements Neg romberg, no pronator drift No clonus of the feet  Psychiatric:        Behavior: Behavior normal.     ED Results / Procedures / Treatments   Labs (all labs ordered are listed, but only abnormal results are displayed) Labs Reviewed  BASIC METABOLIC PANEL - Abnormal; Notable for the following components:      Result Value   Glucose, Bld 105 (*)    All other components within normal limits  CBC - Abnormal; Notable for the following components:   RBC 3.63 (*)    Hemoglobin 10.8 (*)    HCT 33.0 (*)    All other components within normal limits  URINALYSIS, ROUTINE W REFLEX MICROSCOPIC - Abnormal; Notable for the following components:   APPearance CLOUDY (*)    Hgb urine dipstick LARGE (*)    Protein, ur 100 (*)    Leukocytes,Ua LARGE (*)    RBC / HPF >50 (*)    Bacteria, UA RARE (*)    All other components within normal limits  HEPATIC FUNCTION PANEL - Abnormal; Notable for the following components:   Albumin 3.2 (*)    All other components within normal limits  URINE CULTURE  SARS CORONAVIRUS 2 (TAT 6-24 HRS)  MAGNESIUM  CK  CBG MONITORING, ED  TROPONIN I (HIGH SENSITIVITY)     EKG EKG Interpretation  Date/Time:  Wednesday January 01 2020 17:35:14 EDT Ventricular Rate:  85 PR Interval:    QRS Duration: 89 QT Interval:  345 QTC Calculation: 411 R Axis:   52 Text Interpretation: Sinus rhythm 12 Lead Confirmed by Quintella Reichert [phone]) on 01/01/2020 7:37:48 PM   Radiology CT Angio Chest PE W and/or Wo Contrast  Result Date: 01/01/2020 CLINICAL DATA:  Shortness of breath. Palpitations. Six days postpartum. EXAM: CT ANGIOGRAPHY CHEST WITH CONTRAST TECHNIQUE: Multidetector CT imaging of the chest was performed using the standard protocol during bolus administration of intravenous contrast. Multiplanar CT image reconstructions and MIPs were obtained to evaluate the vascular anatomy. CONTRAST:  18m OMNIPAQUE IOHEXOL 350 MG/ML SOLN COMPARISON:  Radiograph earlier this day. Chest CTA 01/15/2018 FINDINGS: Cardiovascular: There are no filling defects within the pulmonary arteries to suggest pulmonary embolus. No aortic dissection. Heart is normal in size. No pericardial effusion. Mediastinum/Nodes: No enlarged mediastinal or hilar lymph nodes. Minimal soft tissue density in the anterior mediastinum consistent with residual or recurrent thymus. No esophageal wall thickening. Few prominent axillary lymph nodes are likely reactive. Lungs/Pleura: Single nodular ground-glass focus in the anterior left upper lobe/lingula, series 7, image 61. The lungs are otherwise clear. No pleural fluid. No evidence of pulmonary edema. Trachea and mainstem bronchi are patent. Upper Abdomen: No acute abnormality. Musculoskeletal: There are no acute or suspicious osseous abnormalities. Review of the MIP images confirms the above findings. IMPRESSION: 1. No pulmonary embolus. 2. Single small nodular ground-glass focus in the anterior left upper lobe/lingula, likely infectious or inflammatory. Electronically Signed   By: MKeith RakeM.D.   On: 01/01/2020 20:27   DG Chest Portable 1 View  Result  Date: 01/01/2020 CLINICAL DATA:  Dizziness EXAM: PORTABLE CHEST 1  VIEW COMPARISON:  08/26/2018 FINDINGS: The heart size and mediastinal contours are within normal limits. Both lungs are clear. The visualized skeletal structures are unremarkable. IMPRESSION: No active disease. Electronically Signed   By: Ulyses Jarred M.D.   On: 01/01/2020 19:56    Procedures Procedures (including critical care time)  Medications Ordered in ED Medications  sodium chloride (PF) 0.9 % injection (  Canceled Entry 01/01/20 2003)  sodium chloride 0.9 % bolus 500 mL (0 mLs Intravenous Stopped 01/01/20 2051)  iohexol (OMNIPAQUE) 350 MG/ML injection 100 mL (100 mLs Intravenous Contrast Given 01/01/20 2011)  labetalol (NORMODYNE) injection 5 mg (5 mg Intravenous Given 01/01/20 2052)    ED Course  I have reviewed the triage vital signs and the nursing notes.  Pertinent labs & imaging results that were available during my care of the patient were reviewed by me and considered in my medical decision making (see chart for details).  Clinical Course as of Dec 31 2120  Wed Jan 01, 2020  2102 '10mg'$  enalapril '25mg'$  hydrochlothiazide   [BM]    Clinical Course User Index [BM] Deliah Boston, PA-C   Nikko Fleury was evaluated in Emergency Department on 01/01/2020 for the symptoms described in the history of present illness. She was evaluated in the context of the global COVID-19 pandemic, which necessitated consideration that the patient might be at risk for infection with the SARS-CoV-2 virus that causes COVID-19. Institutional protocols and algorithms that pertain to the evaluation of patients at risk for COVID-19 are in a state of rapid change based on information released by regulatory bodies including the CDC and federal and state organizations. These policies and algorithms were followed during the patient's care in the ED.  MDM Rules/Calculators/A&P                     29 year old female status post vaginal  delivery 6 days ago presents today for palpitations, lightheadedness and shortness of breath onset around 3 PM today, symptoms gradually improving.  Cranial nerves intact, neurovascular tact to all 4 extremities with strong equal strength, strong equal distal pulses.  Heart regular rate and rhythm, lungs clear, abdomen soft nontender without peritoneal signs.  No evidence of DVT on exam.  Based on patient's recent pregnancy there is concern for possible thromboembolism, will obtain CT PE study at this time along with basic blood work including CBC, BMP, urinalysis, troponin, CK, magnesium.  Chest x-ray and EKG ordered.  Additionally patient noted to be hypertensive in the ER, blood pressure systolic 128N, normally patient reports that is around 130s, will give labetalol.  Patient seen and evaluated by Dr. Ralene Bathe. - EKG: Sinus rhythm 12 Lead Confirmed by Quintella Reichert [phone]) on 01/01/2020 7:37:48 PM  CXR:  IMPRESSION:  No active disease.   CBG 94 Magnesium 1.7 CK 79 CBC shows hemoglobin 10.8, similar to prior, no leukocytosis BMP shows no emergent electrolyte derangement or elevation of kidney function LFTs not elevated High-sensitivity troponin within normal limits, onset of symptoms greater than 3-4 hours prior to arrival, no indication for delta troponin, additionally patient chest pain-free Urinalysis shows WBCs, RBCs, rare bacteria, large leukocytes and 100 protein, no nitrites, sent for culture, patient without urinary symptoms suspect possibly contaminated  CT Angio PE Study:  IMPRESSION:  1. No pulmonary embolus.  2. Single small nodular ground-glass focus in the anterior left  upper lobe/lingula, likely infectious or inflammatory.  - Case discussed with Dr. Ralene Bathe who has seen and evaluated the patient.  Plan  of care this time is to consult OB/GYN regarding elevated blood pressure and proteinuria for their recommendations.  We will also perform outpatient Covid test at this time  regarding possible infectious nodule, no indication for antibiotics at this time. - I discussed the case with OB/GYN Dr. Steva Ready who recommends increasing patient's enalapril to 10 mg daily and to start patient on hydrochlorothiazide 25 mg daily and for patient follow-up with OB/GYN as outpatient.  At this time there does not appear to be any evidence of an acute emergency medical condition and the patient appears stable for discharge with appropriate outpatient follow up. Diagnosis was discussed with patient who verbalizes understanding of care plan and is agreeable to discharge. I have discussed return precautions with patient who verbalizes understanding of return precautions. Patient encouraged to follow-up with their PCP and OBGYN. All questions answered.  Patient's case discussed with Dr. Ralene Bathe who agrees with plan to discharge with medication changes and outpatient follow-up.   Note: Portions of this report may have been transcribed using voice recognition software. Every effort was made to ensure accuracy; however, inadvertent computerized transcription errors may still be present. Final Clinical Impression(s) / ED Diagnoses Final diagnoses:  Hypertension, unspecified type    Rx / DC Orders ED Discharge Orders         Ordered    enalapril (VASOTEC) 10 MG tablet  Daily     01/01/20 2119    hydrochlorothiazide (HYDRODIURIL) 25 MG tablet  Daily     01/01/20 2119           Gari Crown 01/01/20 2124    Gari Crown 01/01/20 2124    Quintella Reichert, MD 01/04/20 1031

## 2020-01-01 NOTE — ED Triage Notes (Signed)
Per EMS, patient from home, 6 days postpartum, reports episode of dizziness with hyperventilation and tingling to extremities. Hx panic attacks. Reports she has not been taking anxiety medication. Denies chest pain, SOB.  CBG 107  BP 140/60

## 2020-01-02 ENCOUNTER — Other Ambulatory Visit: Payer: Medicaid Other

## 2020-01-02 ENCOUNTER — Emergency Department (HOSPITAL_COMMUNITY)
Admission: EM | Admit: 2020-01-02 | Discharge: 2020-01-02 | Disposition: A | Discharge status: Home / Self Care | Payer: Medicaid Other | Attending: Emergency Medicine | Admitting: Emergency Medicine

## 2020-01-02 ENCOUNTER — Encounter (HOSPITAL_COMMUNITY): Payer: Self-pay

## 2020-01-02 ENCOUNTER — Ambulatory Visit (INDEPENDENT_AMBULATORY_CARE_PROVIDER_SITE_OTHER): Payer: Medicaid Other

## 2020-01-02 ENCOUNTER — Encounter: Payer: Medicaid Other | Admitting: Obstetrics and Gynecology

## 2020-01-02 VITALS — BP 143/99 | HR 92

## 2020-01-02 DIAGNOSIS — Z975 Presence of (intrauterine) contraceptive device: Secondary | ICD-10-CM | POA: Diagnosis not present

## 2020-01-02 DIAGNOSIS — R002 Palpitations: Secondary | ICD-10-CM | POA: Insufficient documentation

## 2020-01-02 DIAGNOSIS — R42 Dizziness and giddiness: Secondary | ICD-10-CM | POA: Diagnosis present

## 2020-01-02 DIAGNOSIS — Z79899 Other long term (current) drug therapy: Secondary | ICD-10-CM | POA: Diagnosis not present

## 2020-01-02 DIAGNOSIS — E119 Type 2 diabetes mellitus without complications: Secondary | ICD-10-CM | POA: Diagnosis not present

## 2020-01-02 DIAGNOSIS — Z0130 Encounter for examination of blood pressure without abnormal findings: Secondary | ICD-10-CM

## 2020-01-02 DIAGNOSIS — Z794 Long term (current) use of insulin: Secondary | ICD-10-CM | POA: Insufficient documentation

## 2020-01-02 DIAGNOSIS — F419 Anxiety disorder, unspecified: Secondary | ICD-10-CM

## 2020-01-02 LAB — BASIC METABOLIC PANEL
Anion gap: 9 (ref 5–15)
BUN: 9 mg/dL (ref 6–20)
CO2: 21 mmol/L — ABNORMAL LOW (ref 22–32)
Calcium: 9.6 mg/dL (ref 8.9–10.3)
Chloride: 108 mmol/L (ref 98–111)
Creatinine, Ser: 0.5 mg/dL (ref 0.44–1.00)
GFR calc Af Amer: >60 mL/min (ref >60)
GFR calc non Af Amer: >60 mL/min (ref >60)
Glucose, Bld: 198 mg/dL — ABNORMAL HIGH (ref 70–99)
Potassium: 3.8 mmol/L (ref 3.5–5.1)
Sodium: 138 mmol/L (ref 135–145)

## 2020-01-02 LAB — URINALYSIS, ROUTINE W REFLEX MICROSCOPIC
Bacteria, UA: NONE SEEN
Bilirubin Urine: NEGATIVE
Glucose, UA: NEGATIVE mg/dL
Ketones, ur: NEGATIVE mg/dL
Nitrite: NEGATIVE
Protein, ur: NEGATIVE mg/dL
Specific Gravity, Urine: 1.006 (ref 1.005–1.030)
pH: 6 (ref 5.0–8.0)

## 2020-01-02 LAB — HEPATIC FUNCTION PANEL
ALT: 22 U/L (ref 0–44)
AST: 18 U/L (ref 15–41)
Albumin: 3.2 g/dL — ABNORMAL LOW (ref 3.5–5.0)
Alkaline Phosphatase: 90 U/L (ref 38–126)
Bilirubin, Direct: <0.1 mg/dL (ref 0.0–0.2)
Total Bilirubin: 0.3 mg/dL (ref 0.3–1.2)
Total Protein: 6.5 g/dL (ref 6.5–8.1)

## 2020-01-02 LAB — CBC
HCT: 33 % — ABNORMAL LOW (ref 36.0–46.0)
Hemoglobin: 10.9 g/dL — ABNORMAL LOW (ref 12.0–15.0)
MCH: 29.4 pg (ref 26.0–34.0)
MCHC: 33 g/dL (ref 30.0–36.0)
MCV: 88.9 fL (ref 80.0–100.0)
Platelets: 317 10*3/uL (ref 150–400)
RBC: 3.71 MIL/uL — ABNORMAL LOW (ref 3.87–5.11)
RDW: 13 % (ref 11.5–15.5)
WBC: 8.6 10*3/uL (ref 4.0–10.5)
nRBC: 0 % (ref 0.0–0.2)

## 2020-01-02 LAB — TROPONIN I (HIGH SENSITIVITY)
Troponin I (High Sensitivity): 8 ng/L
Troponin I (High Sensitivity): 7 ng/L (ref <18)

## 2020-01-02 LAB — I-STAT BETA HCG BLOOD, ED (MC, WL, AP ONLY): I-stat hCG, quantitative: 83.3 m[IU]/mL — ABNORMAL HIGH

## 2020-01-02 LAB — SARS CORONAVIRUS 2 (TAT 6-24 HRS): SARS Coronavirus 2: NEGATIVE

## 2020-01-02 MED ORDER — SODIUM CHLORIDE 0.9% FLUSH: 3.0000 mL | Freq: Once | INTRAVENOUS | Status: DC

## 2020-01-02 NOTE — ED Notes (Signed)
Patient verbalizes understanding of discharge instructions. Opportunity for questioning and answers were provided. Armband removed by staff, pt discharged from ED ambulatory.   

## 2020-01-02 NOTE — Progress Notes (Signed)
Pt here today for BP check s/p vaginal delivery on 12/26/19 with gestational HTN and start of Vasotec 5 mg po tablet.  Pt reports that she last took her Vasotec at around 10 am.  BP LA 143/99.  Pt reports that she has went to the ED for the past two days because she has not been getting any sleep because the baby keeps crying.  Per chart review pt's ED notes shows that she went for dizziness.  I asked pt if she is having any visual disturbances or headaches.  Pt reports that she has had some headaches and blurry vision not today.  Pt states that her sx's come from her not getting sleep and having the other children at home .  Notified Dr. Rip Harbour who recommends that pt continue to take BP medication, move her pp visit appt up one week, and keep the 2hr appt the same.  Notified pt providers recommendation and that if she has any HTN sx's to please call the office.  I asked pt if she wanted to speak with [facility] about how she has been feeling about the baby crying all the time.   Pt states yes that would be great.  Pt handed off to [facility] for assistance.    Mel Almond, RN 01/03/20

## 2020-01-02 NOTE — ED Provider Notes (Signed)
[facility] Provider Note   CSN: [id number] Arrival date & time: 01/02/20  0222     History Chief Complaint  Patient presents with  . Dizziness    Jasmin Ellis is a 29 y.o. female.   Dizziness Quality:  Lightheadedness Severity:  Mild Onset quality:  Gradual Duration:  3 days Timing:  Constant Progression:  Resolved Chronicity:  New Context: not when bending over   Relieved by:  None tried Worsened by:  Nothing Ineffective treatments:  None tried Associated symptoms: no blood in stool        Past Medical History:  Diagnosis Date  . Anemia   . Anxiety   . Back pain, chronic    s/p fall 8 years ago  . Diabetes mellitus without complication ([place])   . Fibroids     Patient Active Problem List   Diagnosis Date Noted  . IUD (intrauterine device) in place 12/26/2019  . Gestational hypertension 12/26/2019  . 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 GBS (group B Streptococcus carrier), +RV culture, currently pregnant 12/09/2019  . Obesity in pregnancy 07/03/2019  . Dyspepsia 04/12/2018  . Insomnia 03/10/2018  . Anxiety with depression 03/10/2018  . Fatigue 02/13/2018  . Dizziness 01/11/2018  . Supervision of other high risk pregnancies, second trimester 03/08/2015  . Pre-existing type 2 diabetes mellitus during pregnancy in second trimester   . Diabetes mellitus in pregnancy in third trimester 08/05/2014  . Back pain, chronic     Past Surgical History:  Procedure Laterality Date  . NO PAST SURGERIES       OB History    Gravida  4   Para  4   Term  4   Preterm  0   AB  0   Living  4     SAB  0   TAB  0   Ectopic  0   Multiple  0   Live Births  4           Family History  Problem Relation Age of Onset  . Diabetes Father     Social History   Tobacco Use  . Smoking status: Never Smoker  . Smokeless tobacco: Never Used  Substance Use Topics  . Alcohol use: No  . Drug use:  No    Home Medications Prior to Admission medications   Medication Sig Start Date End Date Taking? Authorizing Provider  acetaminophen (TYLENOL) 325 MG tablet Take 650 mg by mouth every 6 (six) hours as needed for mild pain or headache.   Yes [provider]  enalapril (VASOTEC) 10 MG tablet Take 1 tablet (10 mg total) by mouth daily for 20 days. 01/01/20 01/21/20 Yes Nuala Alpha A, PA-C  hydrochlorothiazide (HYDRODIURIL) 25 MG tablet Take 1 tablet (25 mg total) by mouth daily. 01/01/20  Yes Nuala Alpha A, PA-C  ibuprofen (ADVIL) 600 MG tablet Take 1 tablet (600 mg total) by mouth every 6 (six) hours. 12/27/19  Yes Lurline Del, DO  metFORMIN (GLUCOPHAGE) 500 MG tablet Take 1 tablet (500 mg total) by mouth 2 (two) times daily with a meal. 12/27/19  Yes Welborn, Ryan, DO  Prenatal Vit-Fe Fumarate-FA (MULTIVITAMIN-PRENATAL) 27-0.8 MG TABS tablet Take 1 tablet by mouth daily at 12 noon. 07/03/19  Yes Dove, Myra C, MD  sertraline (ZOLOFT) 25 MG tablet Take 1 tablet (25 mg total) by mouth daily. Patient taking differently: Take 25 mg by mouth daily as needed (depression).  11/05/19  Yes  Osborne Oman, MD  Accu-Chek FastClix Lancets MISC 1 Device by Percutaneous route 4 (four) times daily. 07/03/19   Emily Filbert, MD  Blood Pressure Monitor KIT 1 Device by Does not apply route daily. ICD 10: Z34.00 07/18/19   Guss Bunde, MD  glucose blood (ACCU-CHEK GUIDE) test strip Use as instructed 08/07/19   Sloan Leiter, MD  Insulin Syringe-Needle U-100 30G X 15/64" 0.5 ML MISC 1 Device by Does not apply route as needed. 07/25/19   Aletha Halim, MD  insulin NPH Human (HUMULIN N) 100 UNIT/ML injection Inject before breakfast (10 units) and before supper (8 units) Patient not taking: Reported on 01/01/2020 11/19/19 01/02/20  Aletha Halim, MD    Allergies    Patient has no known allergies.  Review of Systems   Review of Systems  Gastrointestinal: Negative for blood in stool.   Neurological: Positive for dizziness.  All other systems reviewed and are negative.   Physical Exam Updated Vital Signs BP (!) 156/99   Pulse 71   Temp 99 F (37.2 C) (Oral)   Resp 14   Ht '5\' 4"'$  (1.626 m)   SpO2 99%   BMI 35.02 kg/m   Physical Exam Vitals and nursing note reviewed.  Constitutional:      Appearance: She is well-developed.  HENT:     Head: Normocephalic and atraumatic.     Mouth/Throat:     Mouth: Mucous membranes are dry.     Pharynx: Oropharynx is clear.  Eyes:     Conjunctiva/sclera: Conjunctivae normal.  Cardiovascular:     Rate and Rhythm: Normal rate and regular rhythm.  Pulmonary:     Effort: No respiratory distress.     Breath sounds: No stridor.  Abdominal:     General: There is no distension.  Musculoskeletal:        General: No swelling or tenderness. Normal range of motion.     Cervical back: Normal range of motion.  Skin:    General: Skin is warm and dry.  Neurological:     General: No focal deficit present.     Mental Status: She is alert.     ED Results / Procedures / Treatments   Labs (all labs ordered are listed, but only abnormal results are displayed) Labs Reviewed  BASIC METABOLIC PANEL - Abnormal; Notable for the following components:      Result Value   CO2 21 (*)    Glucose, Bld 198 (*)    All other components within normal limits  CBC - Abnormal; Notable for the following components:   RBC 3.71 (*)    Hemoglobin 10.9 (*)    HCT 33.0 (*)    All other components within normal limits  URINALYSIS, ROUTINE W REFLEX MICROSCOPIC - Abnormal; Notable for the following components:   Color, Urine COLORLESS (*)    Hgb urine dipstick MODERATE (*)    Leukocytes,Ua TRACE (*)    All other components within normal limits  HEPATIC FUNCTION PANEL - Abnormal; Notable for the following components:   Albumin 3.2 (*)    All other components within normal limits  I-STAT BETA HCG BLOOD, ED ([facility], [facility], [facility] ONLY) - Abnormal; Notable for the  following components:   I-stat hCG, quantitative 83.3 (*)    All other components within normal limits  CBG MONITORING, ED  TROPONIN I (HIGH SENSITIVITY)  TROPONIN I (HIGH SENSITIVITY)    EKG None  Radiology CT Angio Chest PE W and/or Wo Contrast  Result  Date: 01/01/2020 CLINICAL DATA:  Shortness of breath. Palpitations. Six days postpartum. EXAM: CT ANGIOGRAPHY CHEST WITH CONTRAST TECHNIQUE: Multidetector CT imaging of the chest was performed using the standard protocol during bolus administration of intravenous contrast. Multiplanar CT image reconstructions and MIPs were obtained to evaluate the vascular anatomy. CONTRAST:  168m OMNIPAQUE IOHEXOL 350 MG/ML SOLN COMPARISON:  Radiograph earlier this day. Chest CTA 01/15/2018 FINDINGS: Cardiovascular: There are no filling defects within the pulmonary arteries to suggest pulmonary embolus. No aortic dissection. Heart is normal in size. No pericardial effusion. Mediastinum/Nodes: No enlarged mediastinal or hilar lymph nodes. Minimal soft tissue density in the anterior mediastinum consistent with residual or recurrent thymus. No esophageal wall thickening. Few prominent axillary lymph nodes are likely reactive. Lungs/Pleura: Single nodular ground-glass focus in the anterior left upper lobe/lingula, series 7, image 61. The lungs are otherwise clear. No pleural fluid. No evidence of pulmonary edema. Trachea and mainstem bronchi are patent. Upper Abdomen: No acute abnormality. Musculoskeletal: There are no acute or suspicious osseous abnormalities. Review of the MIP images confirms the above findings. IMPRESSION: 1. No pulmonary embolus. 2. Single small nodular ground-glass focus in the anterior left upper lobe/lingula, likely infectious or inflammatory. Electronically Signed   By: MKeith RakeM.D.   On: 01/01/2020 20:27   DG Chest Portable 1 View  Result Date: 01/01/2020 CLINICAL DATA:  Dizziness EXAM: PORTABLE CHEST 1 VIEW COMPARISON:  08/26/2018  FINDINGS: The heart size and mediastinal contours are within normal limits. Both lungs are clear. The visualized skeletal structures are unremarkable. IMPRESSION: No active disease. Electronically Signed   By: KUlyses JarredM.D.   On: 01/01/2020 19:56    Procedures Procedures (including critical care time)  Medications Ordered in ED Medications  sodium chloride flush (NS) 0.9 % injection 3 mL (has no administration in time range)    ED Course  I have reviewed the triage vital signs and the nursing notes.  Pertinent labs & imaging results that were available during my care of the patient were reviewed by me and considered in my medical decision making (see chart for details).    MDM Rules/Calculators/A&P                      Already worked up for preeclampsia with phone consultation with Dr. EElonda Husky Symptoms improved now. Here with just hypertension. No abdominal pain, headache, proteinuria, elevated transaminases. Symptoms all improved, appointment with ob in a few hours. Willing to go home and see them as scheduled.   Final Clinical Impression(s) / ED Diagnoses Final diagnoses:  Palpitations    Rx / DC Orders ED Discharge Orders    None       Marice Guidone, JCorene Cornea MD 01/02/20 [id number]

## 2020-01-02 NOTE — ED Triage Notes (Signed)
Pt reports that she has been having dizziness all day and palpations, feels like she is going to pass out, seen at [facility] today for the same, told she was hypertensive, pt is also one week postpartum and did not have hx of being hypertensive in pregnancy.

## 2020-01-03 LAB — URINE CULTURE

## 2020-01-03 NOTE — Progress Notes (Signed)
Agree with A & P. 

## 2020-01-13 NOTE — BH Specialist Note (Signed)
Pt did not arrive to video visit and did not answer the phone ; Voicemail not set up so unable to leave voice message; left MyChart message for patient.    [place] via Telemedicine Video Visit  01/13/2020 Jasmin Ellis [id number]  Garlan Fair

## 2020-01-14 ENCOUNTER — Other Ambulatory Visit: Payer: Self-pay

## 2020-01-14 ENCOUNTER — Ambulatory Visit: Payer: Medicaid Other | Admitting: Clinical

## 2020-01-14 DIAGNOSIS — Z5329 Procedure and treatment not carried out because of patient's decision for other reasons: Secondary | ICD-10-CM

## 2020-01-14 DIAGNOSIS — Z91199 Patient's noncompliance with other medical treatment and regimen due to unspecified reason: Secondary | ICD-10-CM

## 2020-01-20 ENCOUNTER — Encounter: Payer: Self-pay | Admitting: Obstetrics and Gynecology

## 2020-01-20 ENCOUNTER — Ambulatory Visit (INDEPENDENT_AMBULATORY_CARE_PROVIDER_SITE_OTHER): Payer: Medicaid Other | Admitting: Obstetrics and Gynecology

## 2020-01-20 ENCOUNTER — Other Ambulatory Visit: Payer: Self-pay

## 2020-01-20 VITALS — BP 125/77 | HR 77 | Wt 188.9 lb

## 2020-01-20 DIAGNOSIS — O139 Gestational [pregnancy-induced] hypertension without significant proteinuria, unspecified trimester: Secondary | ICD-10-CM

## 2020-01-20 DIAGNOSIS — E139 Other specified diabetes mellitus without complications: Secondary | ICD-10-CM

## 2020-01-20 DIAGNOSIS — Z975 Presence of (intrauterine) contraceptive device: Secondary | ICD-10-CM | POA: Diagnosis not present

## 2020-01-20 MED ORDER — ENALAPRIL MALEATE 10 MG PO TABS: 10.0000 mg | ORAL_TABLET | Freq: Every day | ORAL | 0 refills | Status: DC

## 2020-01-20 MED ORDER — HYDROCHLOROTHIAZIDE 25 MG PO TABS: 25.0000 mg | ORAL_TABLET | Freq: Every day | ORAL | 0 refills | Status: DC

## 2020-01-20 NOTE — Patient Instructions (Signed)
Health Maintenance, Female Adopting a healthy lifestyle and getting preventive care are important in promoting health and wellness. Ask your health care provider about:  The right schedule for you to have regular tests and exams.  Things you can do on your own to prevent diseases and keep yourself healthy. What should I know about diet, weight, and exercise? Eat a healthy diet   Eat a diet that includes plenty of vegetables, fruits, low-fat dairy products, and lean protein.  Do not eat a lot of foods that are high in solid fats, added sugars, or sodium. Maintain a healthy weight Body mass index (BMI) is used to identify weight problems. It estimates body fat based on height and weight. Your health care provider can help determine your BMI and help you achieve or maintain a healthy weight. Get regular exercise Get regular exercise. This is one of the most important things you can do for your health. Most adults should:  Exercise for at least 150 minutes each week. The exercise should increase your heart rate and make you sweat (moderate-intensity exercise).  Do strengthening exercises at least twice a week. This is in addition to the moderate-intensity exercise.  Spend less time sitting. Even light physical activity can be beneficial. Watch cholesterol and blood lipids Have your blood tested for lipids and cholesterol at 31 years of age, then have this test every 5 years. Have your cholesterol levels checked more often if:  Your lipid or cholesterol levels are high.  You are older than 31 years of age.  You are at high risk for heart disease. What should I know about cancer screening? Depending on your health history and family history, you may need to have cancer screening at various ages. This may include screening for:  Breast cancer.  Cervical cancer.  Colorectal cancer.  Skin cancer.  Lung cancer. What should I know about heart disease, diabetes, and high blood  pressure? Blood pressure and heart disease  High blood pressure causes heart disease and increases the risk of stroke. This is more likely to develop in people who have high blood pressure readings, are of African descent, or are overweight.  Have your blood pressure checked: ? Every 3-5 years if you are 18-39 years of age. ? Every year if you are 40 years old or older. Diabetes Have regular diabetes screenings. This checks your fasting blood sugar level. Have the screening done:  Once every three years after age 40 if you are at a normal weight and have a low risk for diabetes.  More often and at a younger age if you are overweight or have a high risk for diabetes. What should I know about preventing infection? Hepatitis B If you have a higher risk for hepatitis B, you should be screened for this virus. Talk with your health care provider to find out if you are at risk for hepatitis B infection. Hepatitis C Testing is recommended for:  Everyone born from 1945 through 1965.  Anyone with known risk factors for hepatitis C. Sexually transmitted infections (STIs)  Get screened for STIs, including gonorrhea and chlamydia, if: ? You are sexually active and are younger than 31 years of age. ? You are older than 31 years of age and your health care provider tells you that you are at risk for this type of infection. ? Your sexual activity has changed since you were last screened, and you are at increased risk for chlamydia or gonorrhea. Ask your health care provider if   you are at risk.  Ask your health care provider about whether you are at high risk for HIV. Your health care provider may recommend a prescription medicine to help prevent HIV infection. If you choose to take medicine to prevent HIV, you should first get tested for HIV. You should then be tested every 3 months for as long as you are taking the medicine. Pregnancy  If you are about to stop having your period (premenopausal) and  you may become pregnant, seek counseling before you get pregnant.  Take 400 to 800 micrograms (mcg) of folic acid every day if you become pregnant.  Ask for birth control (contraception) if you want to prevent pregnancy. Osteoporosis and menopause Osteoporosis is a disease in which the bones lose minerals and strength with aging. This can result in bone fractures. If you are 65 years old or older, or if you are at risk for osteoporosis and fractures, ask your health care provider if you should:  Be screened for bone loss.  Take a calcium or vitamin D supplement to lower your risk of fractures.  Be given hormone replacement therapy (HRT) to treat symptoms of menopause. Follow these instructions at home: Lifestyle  Do not use any products that contain nicotine or tobacco, such as cigarettes, e-cigarettes, and chewing tobacco. If you need help quitting, ask your health care provider.  Do not use street drugs.  Do not share needles.  Ask your health care provider for help if you need support or information about quitting drugs. Alcohol use  Do not drink alcohol if: ? Your health care provider tells you not to drink. ? You are pregnant, may be pregnant, or are planning to become pregnant.  If you drink alcohol: ? Limit how much you use to 0-1 drink a day. ? Limit intake if you are breastfeeding.  Be aware of how much alcohol is in your drink. In the [place] one drink equals one 12 oz bottle of beer (355 mL), one 5 oz glass of wine (148 mL), or one 1 oz glass of hard liquor (44 mL). General instructions  Schedule regular health, dental, and eye exams.  Stay current with your vaccines.  Tell your health care provider if: ? You often feel depressed. ? You have ever been abused or do not feel safe at home. Summary  Adopting a healthy lifestyle and getting preventive care are important in promoting health and wellness.  Follow your health care provider's instructions about healthy  diet, exercising, and getting tested or screened for diseases.  Follow your health care provider's instructions on monitoring your cholesterol and blood pressure. This information is not intended to replace advice given to you by your health care provider. Make sure you discuss any questions you have with your health care provider. Document Revised: 09/26/2018 Document Reviewed: 09/26/2018 Elsevier Patient Education  2020 Elsevier Inc.  

## 2020-01-20 NOTE — Progress Notes (Signed)
Subjective:     Jasmin Ellis is a 29 y.o. female who presents for a postpartum visit. She is 4 weeks postpartum following a spontaneous vaginal delivery. I have fully reviewed the prenatal and intrapartum course. The delivery was at 79 gestational weeks. Outcome: spontaneous vaginal delivery. Anesthesia: epidural. Postpartum course has been unremarkable except for her Type 2 DM, which has been controlled with Bid Metformin. Also GHTN, controlled with BP medications.  Baby's course has been unremarkable. Baby is feeding by breast and bottle feeding. Bleeding staining only. Bowel function is normal. Bladder function is normal. Patient is not sexually active. Contraception method is IUD. Postpartum depression screening: negative.  The following portions of the patient's history were reviewed and updated as appropriate: allergies, current medications, past family history, past medical history, past social history, past surgical history and problem list.  Review of Systems Pertinent items noted in HPI and remainder of comprehensive ROS otherwise negative.   Objective:    BP 125/77   Pulse 77   Wt 188 lb 14.4 oz (85.7 kg)   Breastfeeding Yes   BMI 32.42 kg/m   General:  alert   Breasts:  not evaluated  Lungs: clear to auscultation bilaterally  Heart:  regular rate and rhythm, S1, S2 normal, no murmur, click, rub or gallop  Abdomen: soft, non-tender; bowel sounds normal; no masses,  no organomegaly   Vulva:  normal  Vagina: normal vagina  Cervix:  normal IUD strings noted at cervical oz  Corpus: normal  Adnexa:  normal adnexa  Rectal Exam: Not performed.        Assessment:     Nl postpartum exam. Pap smear not done at today's visit.     GHTN Type 2 DM IUD check  Plan:    1. Contraception: IUD 2. Will continue with Metformin and BP meds. Meds refilled Refer back to [facility] for continued management. Return to ADL's as tolerates 3. Follow up in: 1 yr or as needed.

## 2020-01-21 ENCOUNTER — Telehealth: Payer: Self-pay

## 2020-01-21 NOTE — Telephone Encounter (Signed)
Attempted to reach pt to make an appointment to follow up on HTN and DM. Unable to LVM there was none set up. Salvatore Marvel, CMA

## 2020-01-23 ENCOUNTER — Other Ambulatory Visit: Payer: Self-pay

## 2020-01-23 ENCOUNTER — Other Ambulatory Visit: Payer: Medicaid Other

## 2020-01-23 ENCOUNTER — Other Ambulatory Visit: Payer: Self-pay | Admitting: General Practice

## 2020-01-29 ENCOUNTER — Ambulatory Visit (INDEPENDENT_AMBULATORY_CARE_PROVIDER_SITE_OTHER): Payer: Medicaid Other | Admitting: Family Medicine

## 2020-01-29 ENCOUNTER — Other Ambulatory Visit: Payer: Self-pay

## 2020-01-29 ENCOUNTER — Encounter: Payer: Self-pay | Admitting: Family Medicine

## 2020-01-29 VITALS — BP 132/70 | HR 71 | Wt 193.1 lb

## 2020-01-29 DIAGNOSIS — E119 Type 2 diabetes mellitus without complications: Secondary | ICD-10-CM | POA: Diagnosis not present

## 2020-01-29 DIAGNOSIS — E139 Other specified diabetes mellitus without complications: Secondary | ICD-10-CM | POA: Diagnosis not present

## 2020-01-29 DIAGNOSIS — Z23 Encounter for immunization: Secondary | ICD-10-CM

## 2020-01-29 DIAGNOSIS — O139 Gestational [pregnancy-induced] hypertension without significant proteinuria, unspecified trimester: Secondary | ICD-10-CM

## 2020-01-29 DIAGNOSIS — F418 Other specified anxiety disorders: Secondary | ICD-10-CM | POA: Diagnosis not present

## 2020-01-29 LAB — POCT GLYCOSYLATED HEMOGLOBIN (HGB A1C): Hemoglobin A1C: 5.6 % (ref 4.0–5.6)

## 2020-01-29 MED ORDER — FLUOXETINE HCL 20 MG PO TABS: 20.0000 mg | ORAL_TABLET | Freq: Every day | ORAL | 3 refills | Status: DC

## 2020-01-29 MED ORDER — ENALAPRIL MALEATE 10 MG PO TABS: 10.0000 mg | ORAL_TABLET | Freq: Every day | ORAL | 1 refills | Status: DC

## 2020-01-29 MED ORDER — HYDROCHLOROTHIAZIDE 25 MG PO TABS: 25.0000 mg | ORAL_TABLET | Freq: Every day | ORAL | 1 refills | Status: DC

## 2020-01-29 MED ORDER — METFORMIN HCL 500 MG PO TABS: 500.0000 mg | ORAL_TABLET | Freq: Two times a day (BID) | ORAL | 1 refills | Status: DC

## 2020-01-29 NOTE — Patient Instructions (Signed)
Thank you for coming to see me today. It was a pleasure! Today we talked about:   I have refilled your medications.  I recommend continuing your hydrochlorothiazide and enalapril for your blood pressure.  I also recommend continuing your Metformin for diabetes.  I have sent in a medication called Prozac to your pharmacy to be used for your depression.  You may continue to use your hydroxyzine as needed.  You may not notice the effects of this for 4 to 6 weeks.  Please follow-up with me in 6 weeks to see how this medication is working for you.  If you have any questions or concerns, please do not hesitate to call the office at [phone].  Take Care,   [place] Rosetta Rupnow, DO

## 2020-01-29 NOTE — Progress Notes (Signed)
    SUBJECTIVE:   Chief compliant/HPI: annual examination  Jasmin Ellis is a 29 y.o. who presents today for an annual exam.   Review of systems form notable for concerns regarding depression. Patient states her zoloft made her too sleepy so she does not like taking it. She currently has a new infant and is breastfeeding but states she often feels down as she did before the baby. She uses atarax prn for anxiety, but is tired of not feeling herself and would like to try another medication rather than the zoloft. She denies any SI/HI.   Diabetes: Medications: Metformin 500 mg twice daily Compliance: yes Hypoglycemic symptoms: no Last foot exam:  Performed today ROS: denies dizziness, diaphoresis, LOC, polyuria, polydipsia  Hypertension: - Medications: HCTZ 25 mg, enalapril 10 mg - Compliance: yes - Checking BP at home: no - Denies any SOB, CP, vision changes, LE edema, medication SEs, or symptoms of hypotension  Updated history tabs and problem list.   OBJECTIVE:   BP 132/70   Pulse 71   Wt 193 lb 2 oz (87.6 kg)   SpO2 99%   BMI 33.15 kg/m   General: NAD, pleasant Neck: Supple Cardiovascular: no LE edema Respiratory: normal work of breathing MSK: moves 4 extremities equally Derm: no rashes appreciated Neuro: CN II-XII grossly intact Psych: AOx3, appropriate affect  ASSESSMENT/PLAN:   No problem-specific Assessment & Plan notes found for this encounter.    Annual Examination  See AVS for age appropriate recommendations.   Blood pressure reviewed and at goal.  The patient currently uses IUD for contraception. Folate recommended as appropriate, minimum of 400 mcg per day.   Cervical cancer screening: prior Pap reviewed, repeat due in 2023  Follow up in 4-6 weeks or sooner if indicated.   [place] Everlyn Farabaugh, DO PGY-3, [facility]

## 2020-01-31 ENCOUNTER — Other Ambulatory Visit: Payer: Medicaid Other

## 2020-01-31 ENCOUNTER — Ambulatory Visit: Payer: Medicaid Other | Admitting: Medical

## 2020-02-01 ENCOUNTER — Encounter: Payer: Self-pay | Admitting: Family Medicine

## 2020-02-01 NOTE — Assessment & Plan Note (Signed)
Patient reports that Zoloft makes her too tired and she would like to try another medication.  She is currently breast-feeding so will initiate fluoxetine 20 mg as it is one of the safer during breast-feeding.  Patient to follow-up in 4 to 6 weeks to see how this medication is working.

## 2020-02-01 NOTE — Assessment & Plan Note (Signed)
A1c 5.6 today.  Continue current management with Metformin.  Foot exam performed today.

## 2020-02-01 NOTE — Assessment & Plan Note (Signed)
Patient hypertensive during pregnancy.  At postpartum visit patient was doing well on hydrochlorothiazide 25 mg as well as enalapril.  Patient's BP 132/70 in the office today.  We will continue current management.  Baby is doing well.

## 2020-02-20 ENCOUNTER — Emergency Department (HOSPITAL_COMMUNITY)
Admission: EM | Admit: 2020-02-20 | Discharge: 2020-02-21 | Disposition: A | Discharge status: Home / Self Care | Payer: Medicaid Other | Attending: Emergency Medicine | Admitting: Emergency Medicine

## 2020-02-20 ENCOUNTER — Encounter (HOSPITAL_COMMUNITY): Payer: Self-pay | Admitting: Emergency Medicine

## 2020-02-20 ENCOUNTER — Other Ambulatory Visit: Payer: Self-pay

## 2020-02-20 DIAGNOSIS — Z79899 Other long term (current) drug therapy: Secondary | ICD-10-CM | POA: Insufficient documentation

## 2020-02-20 DIAGNOSIS — R1011 Right upper quadrant pain: Secondary | ICD-10-CM

## 2020-02-20 DIAGNOSIS — E119 Type 2 diabetes mellitus without complications: Secondary | ICD-10-CM | POA: Insufficient documentation

## 2020-02-20 DIAGNOSIS — Z975 Presence of (intrauterine) contraceptive device: Secondary | ICD-10-CM | POA: Insufficient documentation

## 2020-02-20 DIAGNOSIS — Z7984 Long term (current) use of oral hypoglycemic drugs: Secondary | ICD-10-CM | POA: Diagnosis not present

## 2020-02-20 LAB — COMPREHENSIVE METABOLIC PANEL
ALT: 13 U/L (ref 0–44)
AST: 15 U/L (ref 15–41)
Albumin: 4.2 g/dL (ref 3.5–5.0)
Alkaline Phosphatase: 64 U/L (ref 38–126)
Anion gap: 10 (ref 5–15)
BUN: 13 mg/dL (ref 6–20)
CO2: 26 mmol/L (ref 22–32)
Calcium: 9.8 mg/dL (ref 8.9–10.3)
Chloride: 102 mmol/L (ref 98–111)
Creatinine, Ser: 0.66 mg/dL (ref 0.44–1.00)
GFR calc Af Amer: >60 mL/min (ref >60)
GFR calc non Af Amer: >60 mL/min (ref >60)
Glucose, Bld: 99 mg/dL (ref 70–99)
Potassium: 3.8 mmol/L (ref 3.5–5.1)
Sodium: 138 mmol/L (ref 135–145)
Total Bilirubin: 0.7 mg/dL (ref 0.3–1.2)
Total Protein: 7.7 g/dL (ref 6.5–8.1)

## 2020-02-20 LAB — URINALYSIS, ROUTINE W REFLEX MICROSCOPIC
Bilirubin Urine: NEGATIVE
Glucose, UA: NEGATIVE mg/dL
Hgb urine dipstick: NEGATIVE
Ketones, ur: NEGATIVE mg/dL
Leukocytes,Ua: NEGATIVE
Nitrite: NEGATIVE
Protein, ur: NEGATIVE mg/dL
Specific Gravity, Urine: 1.017 (ref 1.005–1.030)
pH: 7 (ref 5.0–8.0)

## 2020-02-20 LAB — CBC
HCT: 37.9 % (ref 36.0–46.0)
Hemoglobin: 12.2 g/dL (ref 12.0–15.0)
MCH: 27.9 pg (ref 26.0–34.0)
MCHC: 32.2 g/dL (ref 30.0–36.0)
MCV: 86.7 fL (ref 80.0–100.0)
Platelets: 321 10*3/uL (ref 150–400)
RBC: 4.37 MIL/uL (ref 3.87–5.11)
RDW: 12.5 % (ref 11.5–15.5)
WBC: 8.1 10*3/uL (ref 4.0–10.5)
nRBC: 0 % (ref 0.0–0.2)

## 2020-02-20 LAB — LIPASE, BLOOD: Lipase: 27 U/L (ref 11–51)

## 2020-02-20 LAB — I-STAT BETA HCG BLOOD, ED (MC, WL, AP ONLY): I-stat hCG, quantitative: 16.4 m[IU]/mL — ABNORMAL HIGH (ref <5)

## 2020-02-20 MED ORDER — SODIUM CHLORIDE 0.9% FLUSH: 3.0000 mL | Freq: Once | INTRAVENOUS | Status: DC

## 2020-02-20 NOTE — ED Triage Notes (Signed)
Pt c/o RUQ pain, worse after eating. Denies nausea/vomiting/diarrhea.

## 2020-02-21 ENCOUNTER — Ambulatory Visit (HOSPITAL_COMMUNITY)
Admission: RE | Admit: 2020-02-21 | Discharge: 2020-02-21 | Disposition: A | Discharge status: Home / Self Care | Payer: Medicaid Other | Source: Ambulatory Visit | Attending: Family Medicine | Admitting: Family Medicine

## 2020-02-21 ENCOUNTER — Emergency Department (HOSPITAL_COMMUNITY): Payer: Medicaid Other

## 2020-02-21 ENCOUNTER — Other Ambulatory Visit: Payer: Self-pay

## 2020-02-21 ENCOUNTER — Ambulatory Visit (INDEPENDENT_AMBULATORY_CARE_PROVIDER_SITE_OTHER): Payer: Medicaid Other | Admitting: Student in an Organized Health Care Education/Training Program

## 2020-02-21 VITALS — BP 122/80 | HR 81 | Ht 64.0 in | Wt 185.6 lb

## 2020-02-21 DIAGNOSIS — T839XXA Unspecified complication of genitourinary prosthetic device, implant and graft, initial encounter: Secondary | ICD-10-CM

## 2020-02-21 DIAGNOSIS — R1011 Right upper quadrant pain: Secondary | ICD-10-CM

## 2020-02-21 NOTE — ED Provider Notes (Signed)
[facility] EMERGENCY DEPARTMENT Provider Note   CSN: [id number] Arrival date & time: 02/20/20  2116     History Chief Complaint  Patient presents with  . Abdominal Pain    Jasmin Ellis is a 29 y.o. female.  The history is provided by the patient and medical records.  Abdominal Pain   29 year old female with history of anemia, anxiety, gestational diabetes and gestational hypertension, here with right upper quadrant pain.  Patient is about 2 months postpartum from SVD on 12/26/2019.  This was without complication.  States over the past few weeks she has noticed some right upper quadrant pain intermittently.  She has noticed over the past 5 days that this has been significantly worse after eating.  States she feels like it is worse after eating bread, rice, or similar.  She does not eat a lot of fatty or greasy foods.  States when the pain is severe she does get nauseated and feels like she is going to vomit.  She has not had any fever or chills.  No lower abdominal pain.  No change in bowel habits.  No urinary symptoms.  She is currently breast-feeding.  She has not taken any medications for her symptoms today.  Past Medical History:  Diagnosis Date  . Anemia   . Anxiety   . Back pain, chronic    s/p fall 8 years ago  . Diabetes mellitus in pregnancy in third trimester 08/05/2014   Current Diabetic Medications:  Insulin  [x]  Aspirin 81 mg daily after 12 weeks (? A2/B GDM)  Required Referrals for A1GDM or A2GDM: [ ]  Diabetes Education and Testing Supplies [ ]  Nutrition Cousult  For A2/B GDM or higher classes of DM [ ]  Diabetes Education and Testing Supplies [ ]  Nutrition Counsult [ ]  Fetal ECHO after 20 weeks  [ ]  Eye exam for retina evaluation   Baseline and surveillance lab  . Diabetes mellitus without complication ([place])   . Fibroids   . GBS (group B Streptococcus carrier), +RV culture, currently pregnant 12/09/2019  . Obesity in pregnancy 07/03/2019    Patient  Active Problem List   Diagnosis Date Noted  . IUD (intrauterine device) in place 12/26/2019  . Gestational hypertension 12/26/2019  . Type 2 diabetes mellitus without complications ([place]) 123456  . Insomnia 03/10/2018  . Anxiety with depression 03/10/2018  . Back pain, chronic     Past Surgical History:  Procedure Laterality Date  . NO PAST SURGERIES       OB History    Gravida  4   Para  4   Term  4   Preterm  0   AB  0   Living  4     SAB  0   TAB  0   Ectopic  0   Multiple  0   Live Births  4           Family History  Problem Relation Age of Onset  . Diabetes Father     Social History   Tobacco Use  . Smoking status: Never Smoker  . Smokeless tobacco: Never Used  Substance Use Topics  . Alcohol use: No  . Drug use: No    Home Medications Prior to Admission medications   Medication Sig Start Date End Date Taking? Authorizing Provider  acetaminophen (TYLENOL) 325 MG tablet Take 650 mg by mouth every 6 (six) hours as needed for mild pain or headache.    [provider]  enalapril (  VASOTEC) 10 MG tablet Take 1 tablet (10 mg total) by mouth daily. 01/29/20   Shirley, [place], DO  FLUoxetine (PROZAC) 20 MG tablet Take 1 tablet (20 mg total) by mouth daily. 01/29/20   Shirley, [place], DO  hydrochlorothiazide (HYDRODIURIL) 25 MG tablet Take 1 tablet (25 mg total) by mouth daily. 01/29/20   Shirley, [place], DO  metFORMIN (GLUCOPHAGE) 500 MG tablet Take 1 tablet (500 mg total) by mouth 2 (two) times daily with a meal. 01/29/20   Shirley, [place], DO  Prenatal Vit-Fe Fumarate-FA (MULTIVITAMIN-PRENATAL) 27-0.8 MG TABS tablet Take 1 tablet by mouth daily at 12 noon. 07/03/19   Emily Filbert, MD  insulin NPH Human (HUMULIN N) 100 UNIT/ML injection Inject before breakfast (10 units) and before supper (8 units) Patient not taking: Reported on 01/01/2020 11/19/19 01/02/20  Aletha Halim, MD    Allergies    Patient has no known allergies.  Review of  Systems   Review of Systems  Gastrointestinal: Positive for abdominal pain.  All other systems reviewed and are negative.   Physical Exam Updated Vital Signs BP 124/80   Pulse 71   Temp 97.7 F (36.5 C)   Resp 18   SpO2 100%   Physical Exam Vitals and nursing note reviewed.  Constitutional:      Appearance: She is well-developed.  HENT:     Head: Normocephalic and atraumatic.  Eyes:     Conjunctiva/sclera: Conjunctivae normal.     Pupils: Pupils are equal, round, and reactive to light.  Cardiovascular:     Rate and Rhythm: Normal rate and regular rhythm.     Heart sounds: Normal heart sounds.  Pulmonary:     Effort: Pulmonary effort is normal.     Breath sounds: Normal breath sounds.  Abdominal:     General: Bowel sounds are normal.     Palpations: Abdomen is soft.     Tenderness: There is abdominal tenderness in the right upper quadrant. There is no rebound.  Musculoskeletal:        General: Normal range of motion.     Cervical back: Normal range of motion.  Skin:    General: Skin is warm and dry.  Neurological:     Mental Status: She is alert and oriented to person, place, and time.     ED Results / Procedures / Treatments   Labs (all labs ordered are listed, but only abnormal results are displayed) Labs Reviewed  I-STAT BETA HCG BLOOD, ED ([facility], [facility], [facility] ONLY) - Abnormal; Notable for the following components:      Result Value   I-stat hCG, quantitative 16.4 (*)    All other components within normal limits  LIPASE, BLOOD  COMPREHENSIVE METABOLIC PANEL  CBC  URINALYSIS, ROUTINE W REFLEX MICROSCOPIC    EKG None  Radiology US Abdomen Limited RUQ  Result Date: 02/21/2020 CLINICAL DATA:  Three weeks of right upper quadrant pain EXAM: ULTRASOUND ABDOMEN LIMITED RIGHT UPPER QUADRANT COMPARISON:  None. FINDINGS: Gallbladder: No gallstones or wall thickening visualized. No sonographic Murphy sign noted by sonographer. Common bile duct: Diameter: 4 mm, nondilated  Liver: No focal lesion identified. Within normal limits in parenchymal echogenicity. Portal vein is patent on color Doppler imaging with normal direction of blood flow towards the liver. Other: None. IMPRESSION: Unremarkable right upper quadrant ultrasound. Electronically Signed   By: Lovena Le M.D.   On: 02/21/2020 03:09    Procedures Procedures (including critical care time)  Medications Ordered in ED Medications  sodium chloride  flush (NS) 0.9 % injection 3 mL (has no administration in time range)    ED Course  I have reviewed the triage vital signs and the nursing notes.  Pertinent labs & imaging results that were available during my care of the patient were reviewed by me and considered in my medical decision making (see chart for details).    MDM Rules/Calculators/A&P  29 year old female presenting to the ED with right upper quadrant pain.  She has noticed this over the past 2 weeks intermittently, more noticeable over the past 5 days and worse after eating.  She is about 2 months postpartum, currently breast-feeding.  No history of gallbladder disease.  She is afebrile and nontoxic.  Does have some tenderness in the right upper quadrant without Murphy sign.  No peritoneal signs.  Screening labs are reassuring.  Right upper quadrant ultrasound was obtained which is unremarkable.  Patient has not required any pain control here.  Discussed outpatient management with her and close follow-up with general surgery if any ongoing issues.  She does not wish to take prescription pain medication given she is breast-feeding, she was given max daily dosages of Tylenol and Motrin that she may use at home.  Can follow-up with PCP.  Return here for any new or acute changes.  Final Clinical Impression(s) / ED Diagnoses Final diagnoses:  RUQ pain    Rx / DC Orders ED Discharge Orders    None       [name], PA-C [id number] [phone]    Delora Fuel, MD [id number] [phone]

## 2020-02-21 NOTE — Progress Notes (Signed)
    SUBJECTIVE:   CHIEF COMPLAINT / HPI: right sided pain  Pain- different from previous post-pregnancies. Described as a burning sensation in RUQ that occurs worse after eating. Associated with nausea. Has not tried anything to make better. No diarrhea/constipation/vomiting/dysuria. She went to the ED yesterday and received a RUQ [place] which was negative for signs of cholecystitis or liver pathology. Patient had post-partum IUD placed and had visualization of strings on follow up visit. Does not recall having seen it come out. She has had intermittent vaginal bleeding since delivery 3/12.   PERTINENT  PMH / PSH: vaginal delivery 2 months ago  OBJECTIVE:   BP 122/80   Pulse 81   Ht 5\' 4"  (1.626 m)   Wt 185 lb 9.6 oz (84.2 kg)   SpO2 99%   BMI 31.86 kg/m   General: NAD, pleasant, able to participate in exam Cardiac: RRR, normal heart sounds, no murmurs. 2+ radial and PT pulses bilaterally Respiratory: CTAB, normal effort, No wheezes, rales or rhonchi Abdomen: soft, tender to RUQ, nondistended, no hepatic or splenomegaly, +BS Pelvic: moderate amount of blood from cervical os. IUD strings are not visible.  Extremities: no edema or cyanosis. WWP. Skin: warm and dry, no rashes noted Neuro: alert and oriented x4, no focal deficits Psych: Normal affect and mood  Transabdominal US: hyperechoic region visualized within uterus region but cannot be certain of IUD placement or if this represents calcification  ASSESSMENT/PLAN:   RUQ pain Symptoms highly suspicious of gallbladder disease with negative RUQ [place] at ED.  Could consider acalculous cholecystitis and refer for HIDA scan.  Given vaginal bleeding and non-visualized IUD strings, performed in office transabdominal US and not able to accurately locate IUD.  Ordering stat trans-vaginal [place] to evaluate for both perforation from IUD and retained products.  Patient appears to be comfortable in office and vital signs are stable including afebrile  so will not recommend presenting to MAU at this time but gave patient strict return precautions.      Jasmin Ellis

## 2020-02-21 NOTE — Patient Instructions (Addendum)
It was a pleasure to see you today!  To summarize our discussion for this visit:  Your IUD strings are not visible on pelvic exam or ultrasound so we will have to send you for another ultrasound to locate the IUD. There is a possibility that it fell out without noticing. If this is found to not be the cause of your abdominal pain, we can continue to work up possible gallbladder disease or other causes of your pain.   Please go to the MAU if you start experiencing unbearable pain, increased bleeding, fever.   Some additional health maintenance measures we should update are: Health Maintenance Due  Topic Date Due  . OPHTHALMOLOGY EXAM  Never done  . COVID-19 Vaccine (1) Never done  .    Please return to our clinic to see [place] next week. 5/12 at 9:50am  Call the clinic at [phone] if your symptoms worsen or you have any concerns.   Thank you for allowing me to take part in your care,  Dr. Doristine Mango

## 2020-02-21 NOTE — Discharge Instructions (Addendum)
Can take up to 2000mg  tylenol daily and up to 800mg  motrin 3x daily. Follow-up with your primary care doctor. Information attached for [place] surgery if ongoing issues-- can have your primary care doctor facilitate this. Return here for any new/acute changes.

## 2020-02-26 ENCOUNTER — Other Ambulatory Visit: Payer: Self-pay

## 2020-02-26 ENCOUNTER — Encounter: Payer: Self-pay | Admitting: Family Medicine

## 2020-02-26 ENCOUNTER — Ambulatory Visit (INDEPENDENT_AMBULATORY_CARE_PROVIDER_SITE_OTHER): Payer: Medicaid Other | Admitting: Family Medicine

## 2020-02-26 DIAGNOSIS — R1011 Right upper quadrant pain: Secondary | ICD-10-CM | POA: Diagnosis not present

## 2020-02-26 DIAGNOSIS — F418 Other specified anxiety disorders: Secondary | ICD-10-CM | POA: Diagnosis not present

## 2020-02-26 MED ORDER — PANTOPRAZOLE SODIUM 40 MG PO TBEC
40.0000 mg | DELAYED_RELEASE_TABLET | Freq: Every day | ORAL | 0 refills | Status: DC
Start: 2020-02-26 — End: 2023-05-01

## 2020-02-26 MED ORDER — CALCIUM CARBONATE ANTACID 500 MG PO CHEW: 1.0000 | CHEWABLE_TABLET | Freq: Every day | ORAL | 0 refills | Status: DC

## 2020-02-26 MED ORDER — HYDROXYZINE HCL 10 MG PO TABS
10.0000 mg | ORAL_TABLET | Freq: Three times a day (TID) | ORAL | 2 refills | Status: DC | PRN
Start: 2020-02-26 — End: 2020-10-19

## 2020-02-26 NOTE — Patient Instructions (Signed)
Thank you for coming to see me today. It was a pleasure! Today we talked about:   For your belly pain, I recommend that you use the Tums or calcium carbonate as needed.  I also recommend that you start taking the pantoprazole once daily.  If your pain worsens or you develop fevers and chills and do not hesitate to return to the office.  I have also refilled the Atarax to use as needed with a lower dose of 10 mg to see if this helps you not become a sleepy.  Please follow-up with with me on 03/02/2020 at 1:50 PM or sooner as needed.  If you have any questions or concerns, please do not hesitate to call the office at [phone].  Take Care,   [place] Zamoria Boss, DO

## 2020-02-26 NOTE — Progress Notes (Signed)
   SUBJECTIVE:   CHIEF COMPLAINT / HPI:   Depression/anxiety: Patient reports that she is still not sleeping well due to having a newborn. She is still feeling down and not herself. She reports the last medication, fluoxetine, made her not feel herself so she stopped it. She also did not tolerate zoloft after trying for a few weeks. She is interested in another medication and is currently breastfeeding. She would also like a refill of her atarax that's he takes prn. She has not been taking daily and has not noticed any decreased milk production.   PERTINENT  PMH / PSH: T2DM, HTN, depression  OBJECTIVE:  BP 112/80   Pulse 92   Ht 5\' 4"  (1.626 m)   Wt 186 lb (84.4 kg)   SpO2 100%   Breastfeeding Yes   BMI 31.93 kg/m   General: NAD, pleasant Neck: Supple Respiratory: normal work of breathing Gastrointestinal: soft, mildly tender in RUQ, nondistended, normoactive BS, negative murphy's Psych: AOx3, appropriate affect  ASSESSMENT/PLAN:   RUQ pain Unclear etiology but reports burning pain worse with meals. Recent work up with CMP, lipase, RUQ [place] all wnl. Could be 2/2 GERD or acid reflux. Encouraged patient to trial tums and will also trial 2 weeks of a PPI. Patient to follow up in 1 week to if sx have improved.   Anxiety with depression Patient reports unable to tolerate zoloft or fluoxetine. Patient breastfeeding, but will trial celexa as this is usually tolerated with breastfeeding. Will start with 20mg . Patient to follow up in 1-2 weeks after starting medication. Refilled prn atarax and counseled to try to limit use while breastfeeding.     [place] Lekita Kerekes, DO PGY-3, [facility]

## 2020-02-27 DIAGNOSIS — R1011 Right upper quadrant pain: Secondary | ICD-10-CM | POA: Insufficient documentation

## 2020-02-27 NOTE — Assessment & Plan Note (Signed)
Symptoms highly suspicious of gallbladder disease with negative RUQ [place] at ED.  Could consider acalculous cholecystitis and refer for HIDA scan.  Given vaginal bleeding and non-visualized IUD strings, performed in office transabdominal US and not able to accurately locate IUD.  Ordering stat trans-vaginal [place] to evaluate for both perforation from IUD and retained products.  Patient appears to be comfortable in office and vital signs are stable including afebrile so will not recommend presenting to MAU at this time but gave patient strict return precautions.

## 2020-03-02 ENCOUNTER — Ambulatory Visit: Payer: Medicaid Other

## 2020-03-03 MED ORDER — CITALOPRAM HYDROBROMIDE 20 MG PO TABS
20.0000 mg | ORAL_TABLET | Freq: Every day | ORAL | 3 refills | Status: DC
Start: 2020-03-03 — End: 2022-10-19

## 2020-03-03 NOTE — Assessment & Plan Note (Signed)
Unclear etiology but reports burning pain worse with meals. Recent work up with CMP, lipase, RUQ [place] all wnl. Could be 2/2 GERD or acid reflux. Encouraged patient to trial tums and will also trial 2 weeks of a PPI. Patient to follow up in 1 week to if sx have improved.

## 2020-03-03 NOTE — Assessment & Plan Note (Addendum)
Patient reports unable to tolerate zoloft or fluoxetine. Patient breastfeeding, but will trial celexa as this is usually tolerated with breastfeeding. Will start with 20mg . Patient to follow up in 1-2 weeks after starting medication. Refilled prn atarax and counseled to try to limit use while breastfeeding.

## 2020-06-27 ENCOUNTER — Encounter (HOSPITAL_COMMUNITY): Payer: Self-pay | Admitting: Emergency Medicine

## 2020-06-27 ENCOUNTER — Emergency Department (HOSPITAL_COMMUNITY)
Admission: EM | Admit: 2020-06-27 | Discharge: 2020-06-28 | Disposition: A | Discharge status: Home / Self Care | Payer: Medicaid Other | Attending: Emergency Medicine | Admitting: Emergency Medicine

## 2020-06-27 ENCOUNTER — Other Ambulatory Visit: Payer: Self-pay

## 2020-06-27 DIAGNOSIS — I10 Essential (primary) hypertension: Secondary | ICD-10-CM | POA: Insufficient documentation

## 2020-06-27 DIAGNOSIS — R002 Palpitations: Secondary | ICD-10-CM | POA: Insufficient documentation

## 2020-06-27 DIAGNOSIS — E119 Type 2 diabetes mellitus without complications: Secondary | ICD-10-CM | POA: Diagnosis not present

## 2020-06-27 DIAGNOSIS — R42 Dizziness and giddiness: Secondary | ICD-10-CM | POA: Diagnosis not present

## 2020-06-27 DIAGNOSIS — R519 Headache, unspecified: Secondary | ICD-10-CM | POA: Diagnosis not present

## 2020-06-27 DIAGNOSIS — Z794 Long term (current) use of insulin: Secondary | ICD-10-CM | POA: Insufficient documentation

## 2020-06-27 LAB — URINALYSIS, ROUTINE W REFLEX MICROSCOPIC
Bilirubin Urine: NEGATIVE
Glucose, UA: NEGATIVE mg/dL
Hgb urine dipstick: NEGATIVE
Ketones, ur: NEGATIVE mg/dL
Leukocytes,Ua: NEGATIVE
Nitrite: NEGATIVE
Protein, ur: NEGATIVE mg/dL
Specific Gravity, Urine: 1.018 (ref 1.005–1.030)
pH: 8 (ref 5.0–8.0)

## 2020-06-27 LAB — BASIC METABOLIC PANEL
Anion gap: 9 (ref 5–15)
BUN: 12 mg/dL (ref 6–20)
CO2: 27 mmol/L (ref 22–32)
Calcium: 9.6 mg/dL (ref 8.9–10.3)
Chloride: 101 mmol/L (ref 98–111)
Creatinine, Ser: 0.64 mg/dL (ref 0.44–1.00)
GFR calc Af Amer: >60 mL/min (ref >60)
GFR calc non Af Amer: >60 mL/min (ref >60)
Glucose, Bld: 152 mg/dL — ABNORMAL HIGH (ref 70–99)
Potassium: 4 mmol/L (ref 3.5–5.1)
Sodium: 137 mmol/L (ref 135–145)

## 2020-06-27 LAB — CBC
HCT: 39.8 % (ref 36.0–46.0)
Hemoglobin: 13.1 g/dL (ref 12.0–15.0)
MCH: 29.3 pg (ref 26.0–34.0)
MCHC: 32.9 g/dL (ref 30.0–36.0)
MCV: 89 fL (ref 80.0–100.0)
Platelets: 288 10*3/uL (ref 150–400)
RBC: 4.47 MIL/uL (ref 3.87–5.11)
RDW: 12.1 % (ref 11.5–15.5)
WBC: 7.7 10*3/uL (ref 4.0–10.5)
nRBC: 0 % (ref 0.0–0.2)

## 2020-06-27 LAB — I-STAT BETA HCG BLOOD, ED (MC, WL, AP ONLY): I-stat hCG, quantitative: <5 m[IU]/mL (ref <5)

## 2020-06-27 LAB — CBG MONITORING, ED: Glucose-Capillary: 78 mg/dL (ref 70–99)

## 2020-06-27 NOTE — ED Triage Notes (Signed)
Emergency Medicine Provider Triage Evaluation Note  Jasmin Ellis , a 29 y.o. female  was evaluated in triage.  Pt complains of lightheadedness for 1 month.  Primarily noted with standing but sometimes occurs at rest.  Feels unsteady with ambulation as a result.  Has had generalized headaches but she has a difficult time describing these.  Denies vision changes but will occasionally notice floaters.  Denies numbness or weakness of the extremities.  Occasionally will experience tingling at her feet and hands.  Denies shortness of breath, chest pain, abdominal pain, nausea, or vomiting.  No fevers.  She is approximately 6 months postpartum has not had a menstrual cycle yet as she is currently breast-feeding.  Review of Systems  Positive: Lightheadedness, headaches, tingling Negative: Fevers, weakness, chest pain, shortness of breath, abdominal pain, nausea, vomiting  Physical Exam  BP (!) 143/90 (BP Location: Right Arm)   Pulse 85   Temp 98.9 F (37.2 C) (Oral)   Resp 20   SpO2 97%  Gen:   Awake, no distress   HEENT:  Atraumatic  Resp:  Normal effort  Cardiac:  Normal rate  Abd:   Nondistended, nontender  MSK:   Moves extremities without difficulty  Neuro:  Speech clear and goal oriented.  Follows commands without difficulty.  Cranial nerves II through XII tested and intact.  5/5 strength of BUE and BLE major muscle groups.  Sensation intact to light touch of upper and lower extremities.  Ambulatory with somewhat unsteady gait as she begins to feel lightheaded with standing.  Unable to assess Romberg sign as a result  Medical Decision Making  Medically screening exam initiated at 3:04 PM.  Appropriate orders placed.  Janijah Sparlin was informed that the remainder of the evaluation will be completed by another provider, this initial triage assessment does not replace that evaluation, and the importance of remaining in the ED until their evaluation is complete.  Clinical Impression   Differential considerations include BPPV, cardiac arrhythmia, orthostatic hypotension, symptomatic anemia.  Also considered CVA though less likely given symptoms are intermittent and primarily positional.  She is afebrile and hemodynamically stable at this time.  Will obtain lab work, EKG, and pregnancy test at this time.   Renita Papa, PA-C 06/27/20 1508

## 2020-06-27 NOTE — ED Triage Notes (Signed)
Pt. Stated, Ive had dizziness with a headache for a month.

## 2020-06-28 MED ORDER — FLUTICASONE PROPIONATE 50 MCG/ACT NA SUSP
1.0000 | Freq: Every day | NASAL | 2 refills | Status: DC
Start: 2020-06-28 — End: 2023-05-01

## 2020-06-28 MED ORDER — CETIRIZINE HCL 10 MG PO TABS: 10.0000 mg | ORAL_TABLET | Freq: Every day | ORAL | 0 refills | Status: DC

## 2020-06-28 NOTE — ED Provider Notes (Signed)
[facility] EMERGENCY DEPARTMENT Provider Note   CSN: [id number] Arrival date & time: 06/27/20  1418     History Chief Complaint  Patient presents with  . Dizziness  . Headache    Jasmin Ellis is a 29 y.o. female with PMH significant for anxiety, HTN, and type II DM on Metformin who presents to the ED with a 1 month history of lightheadedness with intermittent palpitations and feeling as though she can "hear her heartbeat in her head".  Patient has a newborn at home and admits that the past few months have been particularly stressful for her.  Patient does take medications for anxiety and depression, as directed by her primary care provider.  She states that she does not sleep well due to the baby crying and admits that she does not drink enough fluids.  She states that her palpitations occur randomly and are not particularly associated with positioning.  She does note that occasionally she feels unsteady on her feet as though she may pass out and will have to sit down.  Patient denies any chest pain, shortness of breath, hemoptysis, history of clots or clotting disorder, history of syncope, family history of premature cardiac death, abdominal pain, nausea or vomiting, fevers or chills, headaches, room spinning dizziness, numbness or weakness, blurred vision, other focal deficits, or other symptoms.  HPI     Past Medical History:  Diagnosis Date  . Anemia   . Anxiety   . Back pain, chronic    s/p fall 8 years ago  . Diabetes mellitus in pregnancy in third trimester 08/05/2014   Current Diabetic Medications:  Insulin  [x]  Aspirin 81 mg daily after 12 weeks (? A2/B GDM)  Required Referrals for A1GDM or A2GDM: [ ]  Diabetes Education and Testing Supplies [ ]  Nutrition Cousult  For A2/B GDM or higher classes of DM [ ]  Diabetes Education and Testing Supplies [ ]  Nutrition Counsult [ ]  Fetal ECHO after 20 weeks  [ ]  Eye exam for retina evaluation   Baseline and  surveillance lab  . Diabetes mellitus without complication ([place])   . Fibroids   . GBS (group B Streptococcus carrier), +RV culture, currently pregnant 12/09/2019  . Obesity in pregnancy 07/03/2019    Patient Active Problem List   Diagnosis Date Noted  . RUQ pain 02/27/2020  . IUD (intrauterine device) in place 12/26/2019  . Gestational hypertension 12/26/2019  . Type 2 diabetes mellitus without complications ([place]) 40/98/1191  . Insomnia 03/10/2018  . Anxiety with depression 03/10/2018  . Back pain, chronic     Past Surgical History:  Procedure Laterality Date  . NO PAST SURGERIES       OB History    Gravida  4   Para  4   Term  4   Preterm  0   AB  0   Living  4     SAB  0   TAB  0   Ectopic  0   Multiple  0   Live Births  4           Family History  Problem Relation Age of Onset  . Diabetes Father     Social History   Tobacco Use  . Smoking status: Never Smoker  . Smokeless tobacco: Never Used  Vaping Use  . Vaping Use: Never used  Substance Use Topics  . Alcohol use: No  . Drug use: No    Home Medications Prior to Admission medications   Medication  Sig Start Date End Date Taking? Authorizing Provider  acetaminophen (TYLENOL) 325 MG tablet Take 650 mg by mouth every 6 (six) hours as needed for mild pain or headache.    [provider]  calcium carbonate (TUMS - DOSED IN MG ELEMENTAL CALCIUM) 500 MG chewable tablet Chew 1 tablet (200 mg of elemental calcium total) by mouth daily. 02/26/20   Shirley, [place], DO  cetirizine (ZYRTEC) 10 MG tablet Take 1 tablet (10 mg total) by mouth daily. 06/28/20 07/28/20  Corena Herter, PA-C  citalopram (CELEXA) 20 MG tablet Take 1 tablet (20 mg total) by mouth daily. 03/03/20   Shirley, [place], DO  enalapril (VASOTEC) 10 MG tablet Take 1 tablet (10 mg total) by mouth daily. 01/29/20   Shirley, [place], DO  fluticasone (FLONASE) 50 MCG/ACT nasal spray Place 1 spray into both nostrils daily. 06/28/20    Corena Herter, PA-C  hydrochlorothiazide (HYDRODIURIL) 25 MG tablet Take 1 tablet (25 mg total) by mouth daily. 01/29/20   Shirley, [place], DO  hydrOXYzine (ATARAX/VISTARIL) 10 MG tablet Take 1 tablet (10 mg total) by mouth 3 (three) times daily as needed for anxiety. 02/26/20   Shirley, [place], DO  metFORMIN (GLUCOPHAGE) 500 MG tablet Take 1 tablet (500 mg total) by mouth 2 (two) times daily with a meal. 01/29/20   Enid Derry, [place], DO  pantoprazole (PROTONIX) 40 MG tablet Take 1 tablet (40 mg total) by mouth daily. 02/26/20   Shirley, [place], DO  Prenatal Vit-Fe Fumarate-FA (MULTIVITAMIN-PRENATAL) 27-0.8 MG TABS tablet Take 1 tablet by mouth daily at 12 noon. 07/03/19   Emily Filbert, MD  insulin NPH Human (HUMULIN N) 100 UNIT/ML injection Inject before breakfast (10 units) and before supper (8 units) Patient not taking: Reported on 01/01/2020 11/19/19 01/02/20  Aletha Halim, MD    Allergies    Patient has no known allergies.  Review of Systems   Review of Systems  All other systems reviewed and are negative.   Physical Exam Updated Vital Signs BP 124/70   Pulse 63   Temp 98 F (36.7 C) (Oral)   Resp 16   SpO2 100%   Physical Exam Vitals and nursing note reviewed. Exam conducted with a chaperone present.  Constitutional:      General: She is not in acute distress.    Appearance: Normal appearance. She is not ill-appearing.  HENT:     Head: Normocephalic and atraumatic.     Right Ear: Tympanic membrane, ear canal and external ear normal.     Left Ear: Tympanic membrane, ear canal and external ear normal.  Eyes:     General: No scleral icterus.    Extraocular Movements: Extraocular movements intact.     Conjunctiva/sclera: Conjunctivae normal.     Pupils: Pupils are equal, round, and reactive to light.  Neck:     Comments: No obvious thyromegaly.   Cardiovascular:     Rate and Rhythm: Normal rate and regular rhythm.     Pulses: Normal pulses.     Heart sounds: Normal heart  sounds.     Comments: Peripheral pulses intact and symmetric.  No abnormal heart sounds. Pulmonary:     Effort: Pulmonary effort is normal. No respiratory distress.     Breath sounds: Normal breath sounds. No wheezing.     Comments: No increased work of breathing. Musculoskeletal:        General: Normal range of motion.     Cervical back: Normal range of motion. No rigidity.  Skin:  General: Skin is dry.     Capillary Refill: Capillary refill takes less than 2 seconds.  Neurological:     General: No focal deficit present.     Mental Status: She is alert and oriented to person, place, and time.     GCS: GCS eye subscore is 4. GCS verbal subscore is 5. GCS motor subscore is 6.     Cranial Nerves: No cranial nerve deficit.     Sensory: No sensory deficit.     Motor: No weakness.     Coordination: Coordination normal.     Gait: Gait normal.     Deep Tendon Reflexes: Reflexes normal.  Psychiatric:        Mood and Affect: Mood normal.        Behavior: Behavior normal.        Thought Content: Thought content normal.     ED Results / Procedures / Treatments   Labs (all labs ordered are listed, but only abnormal results are displayed) Labs Reviewed  BASIC METABOLIC PANEL - Abnormal; Notable for the following components:      Result Value   Glucose, Bld 152 (*)    All other components within normal limits  CBC  URINALYSIS, ROUTINE W REFLEX MICROSCOPIC  CBG MONITORING, ED  I-STAT BETA HCG BLOOD, ED ([facility], [facility], [facility] ONLY)    EKG EKG Interpretation  Date/Time:  Saturday June 27 2020 14:56:50 EDT Ventricular Rate:  84 PR Interval:  158 QRS Duration: 80 QT Interval:  358 QTC Calculation: 423 R Axis:   69 Text Interpretation: Normal sinus rhythm Nonspecific T wave abnormality Abnormal ECG No STEMI Confirmed by Octaviano Glow [phone]) on 06/28/2020 9:04:18 AM   Radiology No results found.  Procedures Procedures (including critical care time)  Medications Ordered in  ED Medications - No data to display  ED Course  I have reviewed the triage vital signs and the nursing notes.  Pertinent labs & imaging results that were available during my care of the patient were reviewed by me and considered in my medical decision making (see chart for details).    MDM Rules/Calculators/A&P                          Labs BMP: Unremarkable and without electrolyte derangement or renal impairment. UA: No evidence to suggest infection.   Beta-hCG: Less than 5. CBC: No anemia or leukocytosis concerning for infection.  EKG is personally reviewed and demonstrates normal sinus rhythm with nonspecific T wave abnormality.   I suspect that patient's 1 month history of lightheadedness and intermittent palpitations with no obvious trigger could be attributed to her reported increased stress and lack of sleep in the context of new baby.  She also states that she has not been drinking enough fluids which she understands could cause her to have elevated heart rate and orthostatic hypotension.  She also tells me that sometimes she has some mild discomfort and "pressure" in front of her ears bilaterally.  Suspect possible eustachian tube dysfunction as contributing pathology.  Will prescribe Flonase and antihistamines.    She reports that she had an episode of palpitations when she was lying in bed comfortably at rest.  While this could be due to stress with baby crying and keeping her up at night, it could also be due to underlying intermittent arrhythmia.  Will refer patient to cardiology and perhaps she can be evaluated with Holter monitor or Zio patch placement.  Patient is  in no discomfort or distress on my exam.  She does have a primary care provider and I encouraged her to follow-up with them in the next week regarding today's encounter and for ongoing evaluation.  I emphasized lifestyle modifications including increased sleep and p.o. hydration.  She denies any symptoms of systemic  illness or fevers at home.  She is very reassured by today's work-up.  I do not feel as though further imaging or laboratory work-up is warranted.  All of the evaluation and work-up results were discussed with the patient and any family at bedside.  Patient and/or family were informed that while patient is appropriate for discharge at this time, some medical emergencies may only develop or become detectable after a period of time.  I specifically instructed patient and/or family to return to return to the ED or seek immediate medical attention for any new or worsening symptoms.  They were provided opportunity to ask any additional questions and have none at this time.  Prior to discharge patient is feeling well, agreeable with plan for discharge home.  They have expressed understanding of verbal discharge instructions as well as return precautions and are agreeable to the plan.    Final Clinical Impression(s) / ED Diagnoses Final diagnoses:  Episodic lightheadedness  Palpitations    Rx / DC Orders ED Discharge Orders         Ordered    Ambulatory referral to Cardiology       Comments: Intermittent palpitations and lightheadedness x 1 month.   06/28/20 1018    cetirizine (ZYRTEC) 10 MG tablet  Daily        06/28/20 1018    fluticasone (FLONASE) 50 MCG/ACT nasal spray  Daily        06/28/20 1018           Reita Chard 06/28/20 1019    Wyvonnia Dusky, MD 06/28/20 1655

## 2020-06-28 NOTE — Discharge Instructions (Signed)
Please read the attachment on dizziness.  It is important that you make every effort to get sleep at home.  I would also like for you to increase your oral hydration.  If you are dehydrated, that can cause lightheadedness and elevated heart rate.  Also mentioned that sometimes you to have a mild discomfort/pressure in front of your ears on both sides.  While your ear exam was reassuring, suspect that you may have a degree of eustachian tube dysfunction.  This could contribute to symptoms of intermittent lightheadedness.  I have prescribed you an antihistamine and intranasal spray.  Please take, as directed.  I would like for you to follow-up with your primary care provider regarding today's encounter and for ongoing evaluation and management.    I have also placed an ambulatory referral to [facility] division.  They should call you to schedule an appointment for further evaluation and to assess for abnormal heart rhythm given your history of intermittent palpitations.  Please return to the ED or seek immediate medical attention should you experience any new or worsening symptoms.

## 2020-07-21 IMAGING — US US OB < 14 WEEKS - US OB TV
1 series · 15 of 28 positions shown · non-contrast
Comparison: November 13, 2018

CLINICAL DATA: Patient recently passed large thrombus from vagina

EXAM:
OBSTETRIC <14 WK US AND TRANSVAGINAL OB US
TECHNIQUE: Both transabdominal and transvaginal ultrasound examinations were
performed for complete evaluation of the gestation as well as the
maternal uterus, adnexal regions, and pelvic cul-de-sac.
Transvaginal technique was performed to assess early pregnancy.

[Series 1: us ob < 14 weeks - us ob tv · 98 acquisitions, 15 frames shown]
[im 1/98]
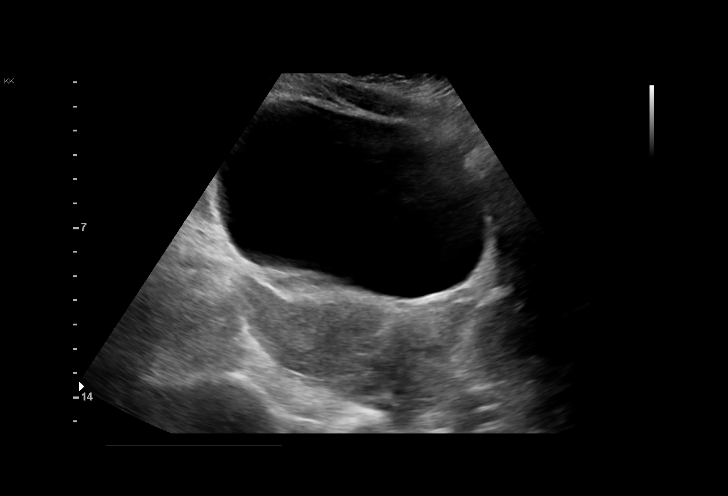
[im 8/98]
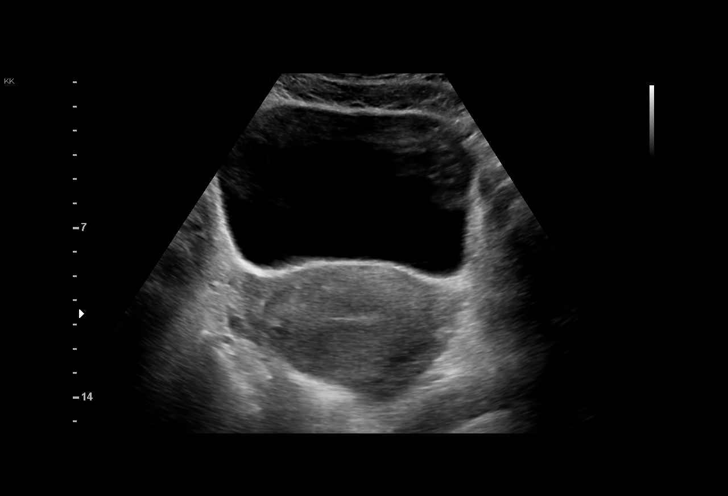
[im 15/98]
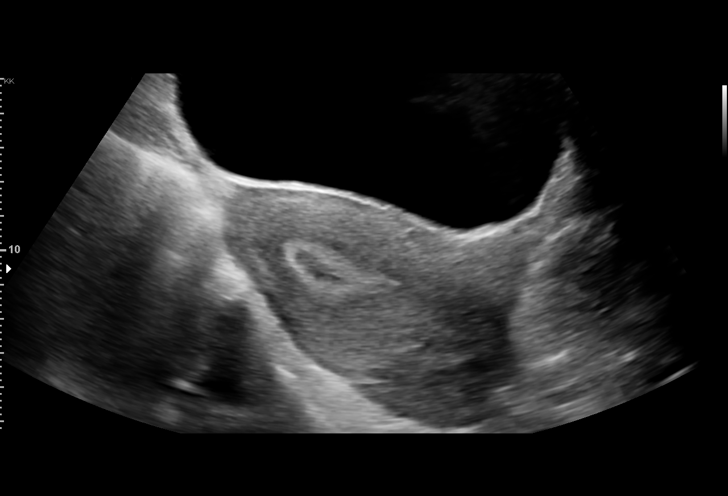
[im 22/98]
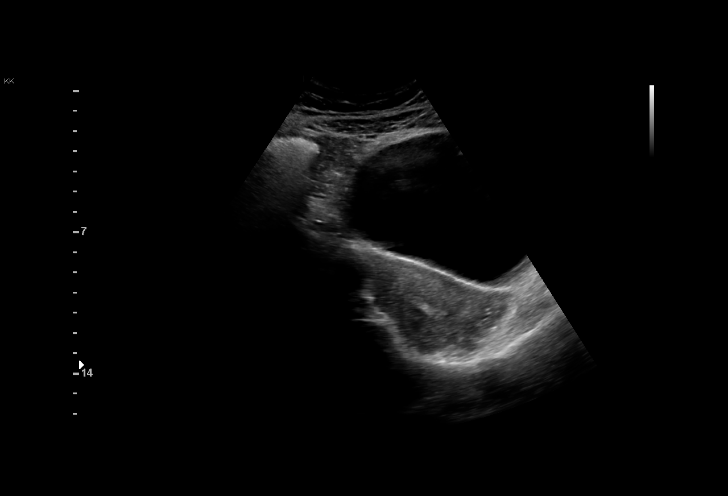
[im 29/98]
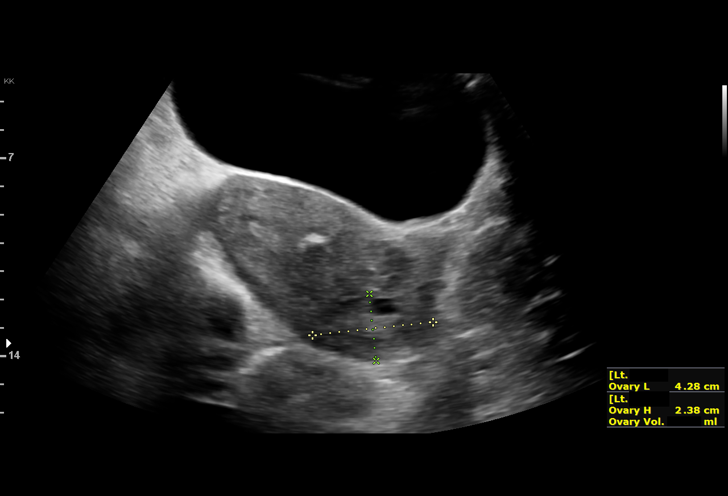
[im 36/98]
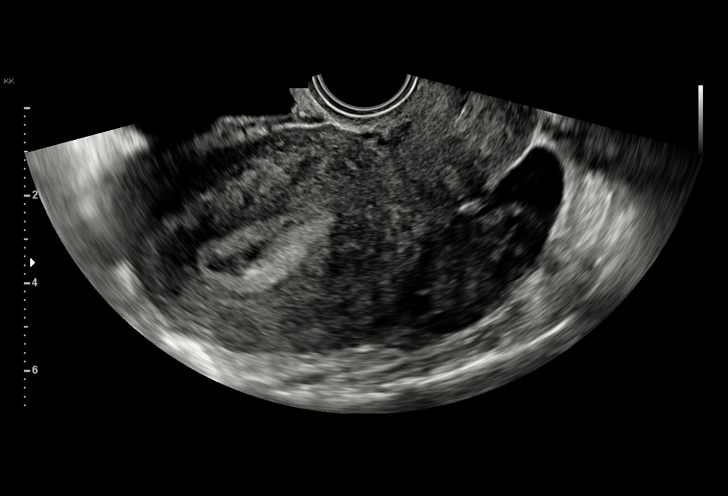
[im 44/98]
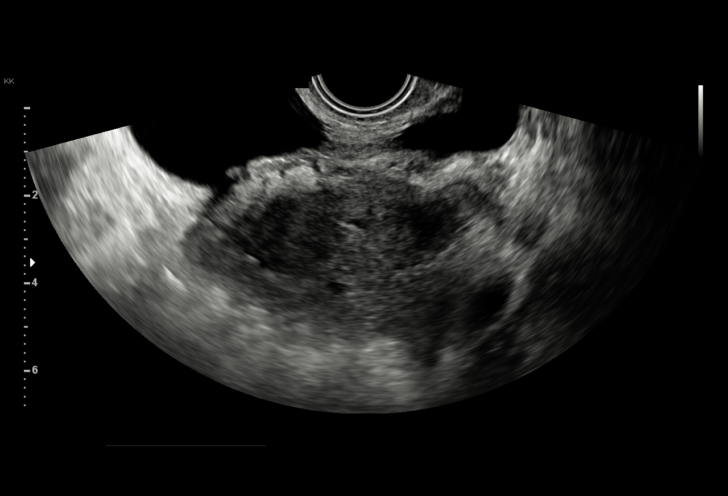
[im 51/98]
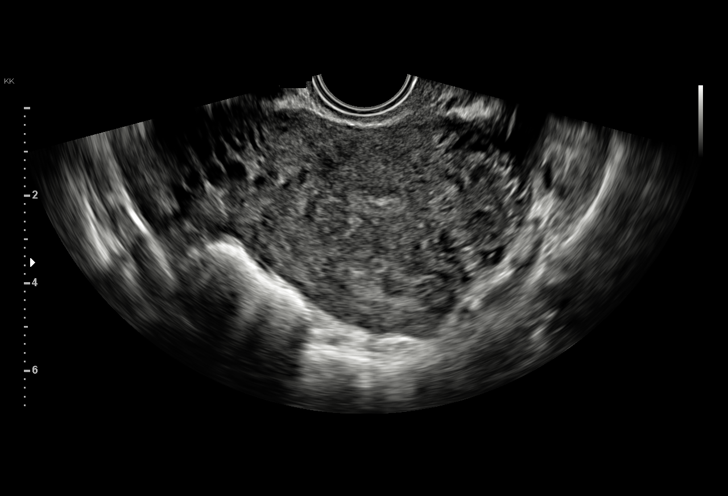
[im 54/98]
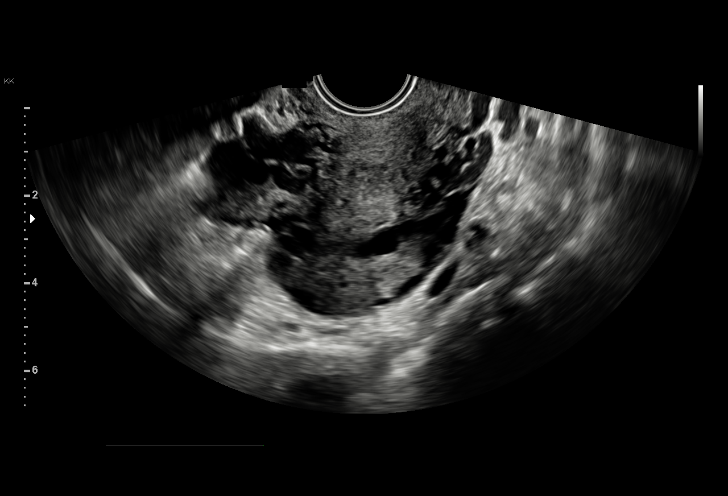
[im 62/98]
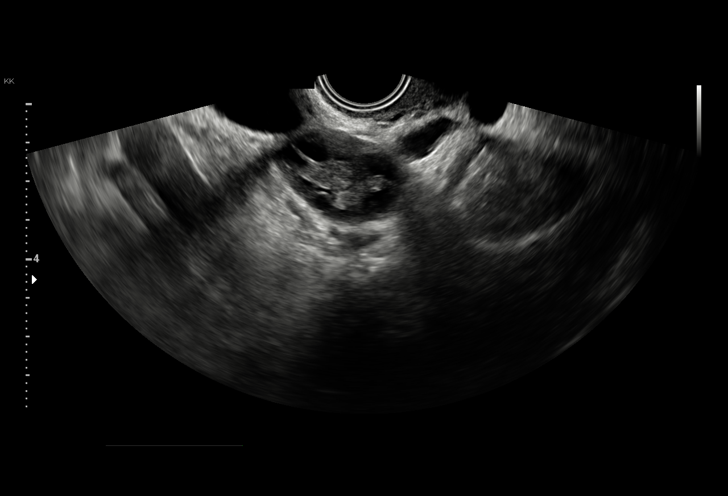
[im 69/98]
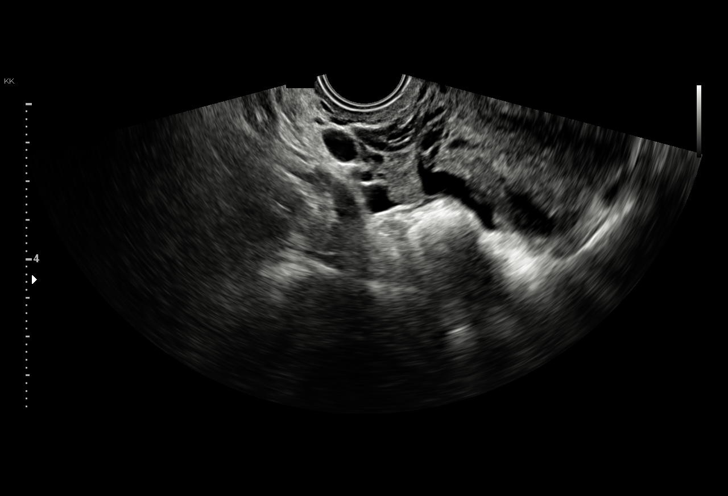
[im 76/98]
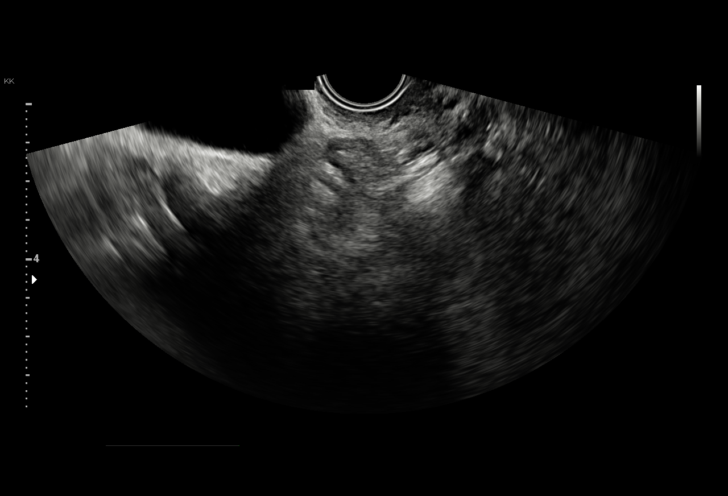
[im 83/98]
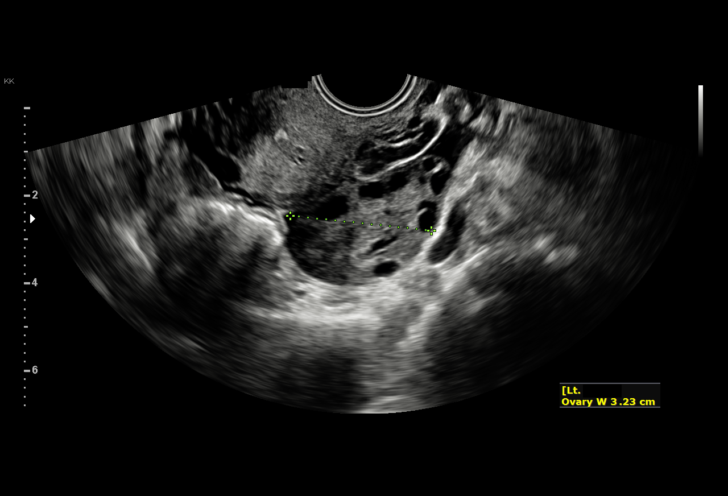
[im 90/98]
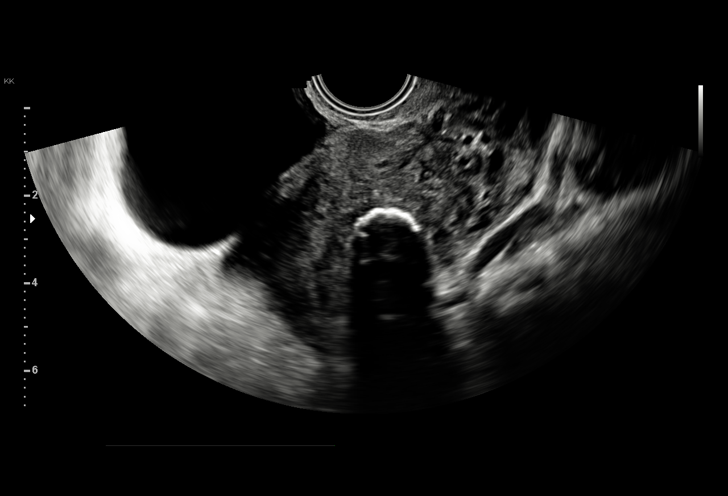
[im 98/98]
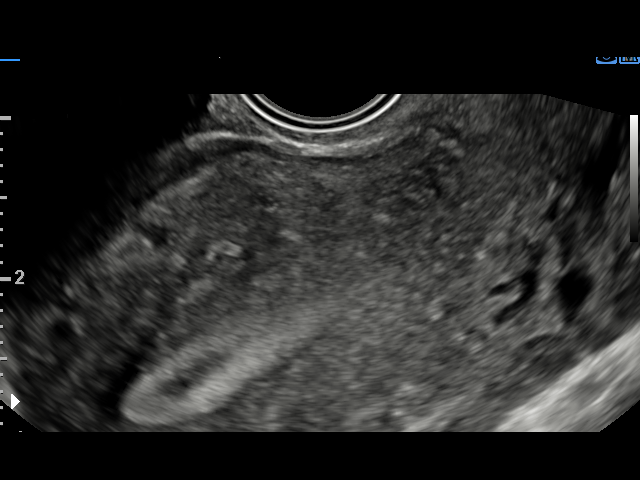

[15 of 28 positions shown; findings below may reference images not displayed]

FINDINGS: Intrauterine gestational sac: Not visualized

Yolk sac:  Not visualized

Embryo:  Not visualized

Cardiac Activity: Not visualized

Subchorionic hemorrhage:  None visualized.

Maternal uterus/adnexae: There is a thickened endometrium, measuring
1.7 cm. There is trace fluid within the endometrium. There is
vascularity involving the endometrium. There is a calcified uterine
leiomyoma present measuring approximately 2 study 2 cm.

Right ovary measures 3.0 x 2.0 x 2.2 cm. Left ovary measures 3.6 x
2.0 x 3.2 cm. No extrauterine pelvic or adnexal mass. No free pelvic
fluid.
IMPRESSION: No intrauterine gestation seen. Thickened endometrium with mild
vascularity. No extrauterine mass or fluid noted. Suspect recent
spontaneous abortion. Although not seen, retained products of
conception is a possibility. Conceivably ectopic gestation could be
present, although no sonographic findings suggesting ectopic
gestation present. Advise continued surveillance of beta HCG values
until value of 0 reached.

Calcified intrauterine leiomyoma measuring approximately 2 x 2 cm
noted.

## 2020-07-26 NOTE — Progress Notes (Deleted)
Cardiology Office Note:    Date:  07/26/2020   ID:  Jasmin, Ellis 05-24-1991, MRN [id number]  PCP:  Jasmin Settler, DO  [facility] Cardiologist:  No primary care provider on file.  [facility] Electrophysiologist:  None   Referring MD: Jasmin Herter, PA-C     History of Present Illness:    Jasmin Ellis is a 29 y.o. female with a hx of gestational HTN, anxiety and depression, and DMII who was referred by Jasmin Blue, PA for evaluation of intermittent lightheadedness.  Patient recently in [facility] ED. Note dated 06/28/20 reviewed. The patient presented with 1 month history of lightheadedness with intermittent palpitations feeling as though she "hear her heartbeat in her head." Palpitations can come on at any time and are not positional or exertional. No associated chest pain, SOB, syncope, significant family history of arrhythmias/SCD, nausea, vomiting, fevers or chills. ECG with NSR with non-specific ST-T wave changes. The patient's work-up in the ED was reassuring without evidence of lab or ECG abnormality. Thought symptoms were due to poor hydration, lack of sleep and stress with a newborn. She was discharged home with plans to follow-up with Cardiology for further evaluation.  Labs 06/27/20: Na 137, K 4.0, Cr 0.64, WBC 7.7, HgB 13.1, A1C 5.6  Past Medical History:  Diagnosis Date  . Anemia   . Anxiety   . Back pain, chronic    s/p fall 8 years ago  . Diabetes mellitus in pregnancy in third trimester 08/05/2014   Current Diabetic Medications:  Insulin  [x]  Aspirin 81 mg daily after 12 weeks (? A2/B GDM)  Required Referrals for A1GDM or A2GDM: [ ]  Diabetes Education and Testing Supplies [ ]  Nutrition Cousult  For A2/B GDM or higher classes of DM [ ]  Diabetes Education and Testing Supplies [ ]  Nutrition Counsult [ ]  Fetal ECHO after 20 weeks  [ ]  Eye exam for retina evaluation   Baseline and surveillance lab  . Diabetes mellitus without complication ([place])   .  Fibroids   . GBS (group B Streptococcus carrier), +RV culture, currently pregnant 12/09/2019  . Obesity in pregnancy 07/03/2019    Past Surgical History:  Procedure Laterality Date  . NO PAST SURGERIES      Current Medications: No outpatient medications have been marked as taking for the 07/28/20 encounter (Appointment) with Freada Bergeron, MD.     Allergies:   Patient has no known allergies.   Social History   Socioeconomic History  . Marital status: Married    Spouse name: Not on file  . Number of children: Not on file  . Years of education: Not on file  . Highest education level: Not on file  Occupational History  . Not on file  Tobacco Use  . Smoking status: Never Smoker  . Smokeless tobacco: Never Used  Vaping Use  . Vaping Use: Never used  Substance and Sexual Activity  . Alcohol use: No  . Drug use: No  . Sexual activity: Not Currently    Birth control/protection: I.U.D.  Other Topics Concern  . Not on file  Social History Narrative   ** Merged History Encounter **       Social Determinants of Health   Financial Resource Strain:   . Difficulty of Paying Living Expenses: Not on file  Food Insecurity:   . Worried About Charity fundraiser in the Last Year: Not on file  . Ran Out of Food in the Last Year: Not on file  Transportation Needs:   . Film/video editor (Medical): Not on file  . Lack of Transportation (Non-Medical): Not on file  Physical Activity:   . Days of Exercise per Week: Not on file  . Minutes of Exercise per Session: Not on file  Stress:   . Feeling of Stress : Not on file  Social Connections:   . Frequency of Communication with Friends and Family: Not on file  . Frequency of Social Gatherings with Friends and Family: Not on file  . Attends Religious Services: Not on file  . Active Member of Clubs or Organizations: Not on file  . Attends Archivist Meetings: Not on file  . Marital Status: Not on file     Family  History: The patient's ***family history includes Diabetes in her father.  ROS:   Please see the history of present illness.    *** All other systems reviewed and are negative.  EKGs/Labs/Other Studies Reviewed:    The following studies were reviewed today: CTA 2020-01-24: FINDINGS: Cardiovascular: There are no filling defects within the pulmonary arteries to suggest pulmonary embolus. No aortic dissection. Heart is normal in size. No pericardial effusion.  Mediastinum/Nodes: No enlarged mediastinal or hilar lymph nodes. Minimal soft tissue density in the anterior mediastinum consistent with residual or recurrent thymus. No esophageal wall thickening. Few prominent axillary lymph nodes are likely reactive.  Lungs/Pleura: Single nodular ground-glass focus in the anterior left upper lobe/lingula, series 7, image 61. The lungs are otherwise clear. No pleural fluid. No evidence of pulmonary edema. Trachea and mainstem bronchi are patent.  Upper Abdomen: No acute abnormality.  Musculoskeletal: There are no acute or suspicious osseous abnormalities.  Review of the MIP images confirms the above findings.  IMPRESSION: 1. No pulmonary embolus. 2. Single small nodular ground-glass focus in the anterior left upper lobe/lingula, likely infectious or inflammatory.  EKG:  EKG is *** ordered today.  The ekg ordered today demonstrates ***  Recent Labs: 01/01/2020: Magnesium 1.7 02/20/2020: ALT 13 06/27/2020: BUN 12; Creatinine, Ser 0.64; Hemoglobin 13.1; Platelets 288; Potassium 4.0; Sodium 137  Recent Lipid Panel No results found for: CHOL, TRIG, HDL, CHOLHDL, VLDL, LDLCALC, LDLDIRECT   Risk Assessment/Calculations:   {Does this patient have ATRIAL FIBRILLATION?:[phone]}   Physical Exam:    VS:  There were no vitals taken for this visit.    Wt Readings from Last 3 Encounters:  02/26/20 186 lb (84.4 kg)  02/21/20 185 lb 9.6 oz (84.2 kg)  01/29/20 193 lb 2 oz (87.6 kg)       GEN: *** Well nourished, well developed in no acute distress HEENT: Normal NECK: No JVD; No carotid bruits LYMPHATICS: No lymphadenopathy CARDIAC: ***RRR, no murmurs, rubs, gallops RESPIRATORY:  Clear to auscultation without rales, wheezing or rhonchi  ABDOMEN: Soft, non-tender, non-distended MUSCULOSKELETAL:  No edema; No deformity  SKIN: Warm and dry NEUROLOGIC:  Alert and oriented x 3 PSYCHIATRIC:  Normal affect   ASSESSMENT:    No diagnosis found. PLAN:    In order of problems listed above:  #Palpitations: #Lightheadedness:   Medication Adjustments/Labs and Tests Ordered: Current medicines are reviewed at length with the patient today.  Concerns regarding medicines are outlined above.  No orders of the defined types were placed in this encounter.  No orders of the defined types were placed in this encounter.   There are no Patient Instructions on file for this visit.   Signed, Freada Bergeron, MD  07/26/2020 3:21 PM    [facility]

## 2020-07-28 ENCOUNTER — Ambulatory Visit: Payer: Medicaid Other | Admitting: Cardiology

## 2020-10-19 ENCOUNTER — Other Ambulatory Visit: Payer: Self-pay

## 2020-10-19 ENCOUNTER — Ambulatory Visit: Payer: Medicaid Other | Admitting: Family Medicine

## 2020-10-19 ENCOUNTER — Encounter: Payer: Self-pay | Admitting: Family Medicine

## 2020-10-19 VITALS — BP 110/75 | HR 76 | Wt 189.8 lb

## 2020-10-19 DIAGNOSIS — N912 Amenorrhea, unspecified: Secondary | ICD-10-CM | POA: Diagnosis present

## 2020-10-19 DIAGNOSIS — F418 Other specified anxiety disorders: Secondary | ICD-10-CM

## 2020-10-19 LAB — POCT URINE PREGNANCY: Preg Test, Ur: NEGATIVE

## 2020-10-19 MED ORDER — HYDROXYZINE HCL 10 MG PO TABS: 10.0000 mg | ORAL_TABLET | Freq: Three times a day (TID) | ORAL | 2 refills | Status: DC | PRN

## 2020-10-19 NOTE — Assessment & Plan Note (Signed)
Requested refill of Hydroxyzine 10mg  TID PRN for anxiety. Refill provided.

## 2020-10-19 NOTE — Progress Notes (Signed)
   Subjective:   Patient ID: Jasmin Ellis    DOB: December 27, 1990, 30 y.o. female   MRN: [id number]  Jasmin Ellis is a 30 y.o. female here for amenorrhea  Amenorrhea: Patient presents today for amenorrhea since giving birth to her baby in March 2021. She notes that she started her period 6 months after having her last babies but has not started with this one.  Notes that she is exclusively breast-feeding.  She also had the Liletta IUD placed after her delivery.  Notes that she had the copper IUD in the past and always had regular periods.  She describes her periods as normal, lasting about 5 to 6 days, moderate flow.  She is a G4 P4.  She is sexually active with her husband, last sexual encounter was 1 month ago.  Denies any dyspareunia, pelvic pain.  Has had some spotting since her giving birth.  Review of Systems:  Per HPI.   Objective:   BP 110/75   Pulse 76   Wt 189 lb 12.8 oz (86.1 kg)   SpO2 100%   BMI 32.58 kg/m  Vitals and nursing note reviewed.  General: pleasant younger female, sitting comfortably in exam chair, well nourished, well developed, in no acute distress with non-toxic appearance Resp: breathing comfortably on room air, speaking in full sentences Skin: warm, dry Extremities: warm and well perfused MSK: gait normal Neuro: Alert and oriented, speech normal Pelvic exam: VULVA: normal appearing vulva with no masses, tenderness or lesions, VAGINA: normal appearing vagina with normal color, no lesions, vaginal discharge - scant bloody, CERVIX: normal appearing cervix without lesions, scant bloody cervical discharge, IUD strings appreciated on exam, exam chaperoned by Karle Barr   Assessment & Plan:   Amenorrhea Chronic. Scant bloody discharge present on pelvic exam, IUD string intact, no other abnormalities appreciated. Urine pregnancy test negative. Suspect normal amenorrhea in setting of hormone producing IUD and exclusive breast feeding. Labor and Delivery  discharge summary reviewed. Given successful breast feeding and no history of PPH - unlikely Sheehan syndrome. Delivery and postpartum period uncomplicated. No other symptoms to indicate thyroid disease however if persists this would likely need to be ruled out. Provided reassurance. Recommended follow up if persists after breast feeding. Discussed option of removal of IUD for more thorough evaluation, however discussed risk of pregnancy with this option. At this time patient opted to continue to monitor.   Patient is interested in transitioning back to Copper IUD in which she had normal periods. Recommended follow up to have this done. Patient voiced understanding and agreement with plan;   Anxiety with depression Requested refill of Hydroxyzine 10mg  TID PRN for anxiety. Refill provided.  Orders Placed This Encounter  Procedures  . POCT urine pregnancy   Meds ordered this encounter  Medications  . hydrOXYzine (ATARAX/VISTARIL) 10 MG tablet    Sig: Take 1 tablet (10 mg total) by mouth 3 (three) times daily as needed for anxiety.    Dispense:  30 tablet    Refill:  2    , DO PGY-3, [facility] 10/19/2020 1:11 PM

## 2020-10-19 NOTE — Assessment & Plan Note (Signed)
Chronic. Scant bloody discharge present on pelvic exam, IUD string intact, no other abnormalities appreciated. Urine pregnancy test negative. Suspect normal amenorrhea in setting of hormone producing IUD and exclusive breast feeding. Labor and Delivery discharge summary reviewed. Given successful breast feeding and no history of PPH - unlikely Sheehan syndrome. Delivery and postpartum period uncomplicated. No other symptoms to indicate thyroid disease however if persists this would likely need to be ruled out. Provided reassurance. Recommended follow up if persists after breast feeding. Discussed option of removal of IUD for more thorough evaluation, however discussed risk of pregnancy with this option. At this time patient opted to continue to monitor.   Patient is interested in transitioning back to Copper IUD in which she had normal periods. Recommended follow up to have this done. Patient voiced understanding and agreement with plan;

## 2021-03-11 ENCOUNTER — Encounter: Payer: Self-pay | Admitting: *Deleted

## 2021-03-31 ENCOUNTER — Other Ambulatory Visit: Payer: Self-pay

## 2021-03-31 DIAGNOSIS — F418 Other specified anxiety disorders: Secondary | ICD-10-CM

## 2021-03-31 MED ORDER — HYDROXYZINE HCL 10 MG PO TABS: 10.0000 mg | ORAL_TABLET | Freq: Three times a day (TID) | ORAL | 2 refills | Status: DC | PRN

## 2021-04-05 ENCOUNTER — Other Ambulatory Visit: Payer: Self-pay | Admitting: *Deleted

## 2021-04-05 DIAGNOSIS — F418 Other specified anxiety disorders: Secondary | ICD-10-CM

## 2021-04-06 MED ORDER — HYDROXYZINE HCL 10 MG PO TABS: 10.0000 mg | ORAL_TABLET | Freq: Three times a day (TID) | ORAL | 2 refills | Status: DC | PRN

## 2021-07-20 IMAGING — US US MFM OB FOLLOW-UP
1 series · 14 of 28 positions shown · non-contrast
Comparison: none

[Series 1: us mfm ob follow-up · 51 acquisitions, 14 frames shown]
[im 2/51]
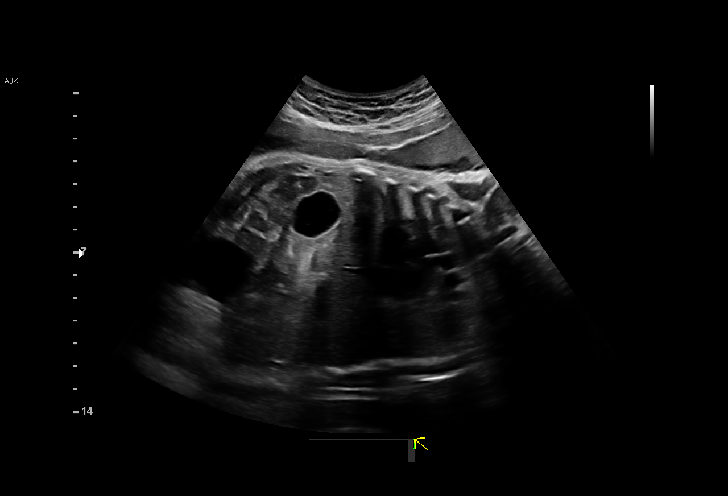
[im 6/51]
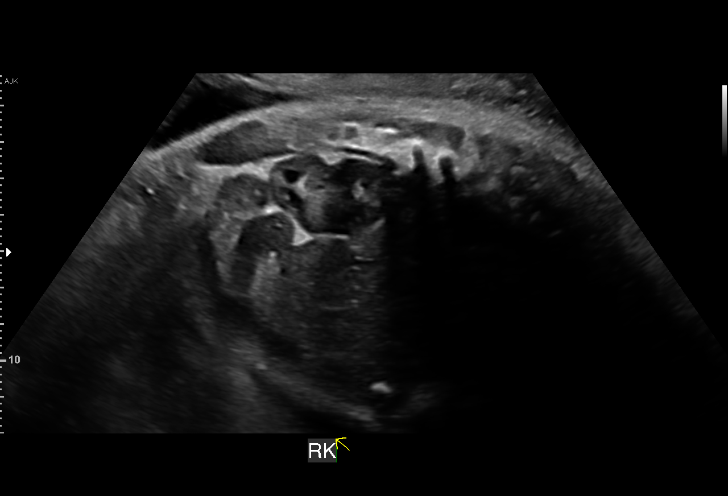
[im 10/51]
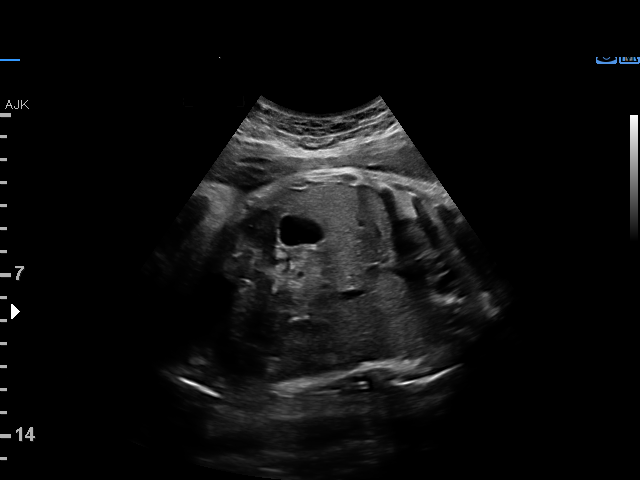
[im 13/51]
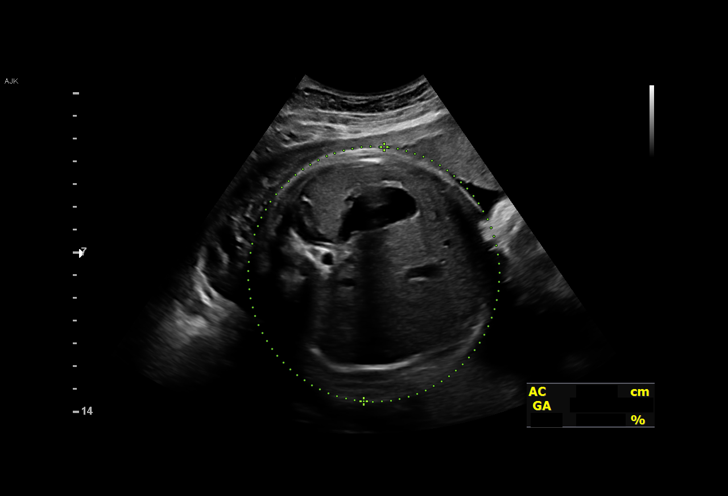
[im 17/51]
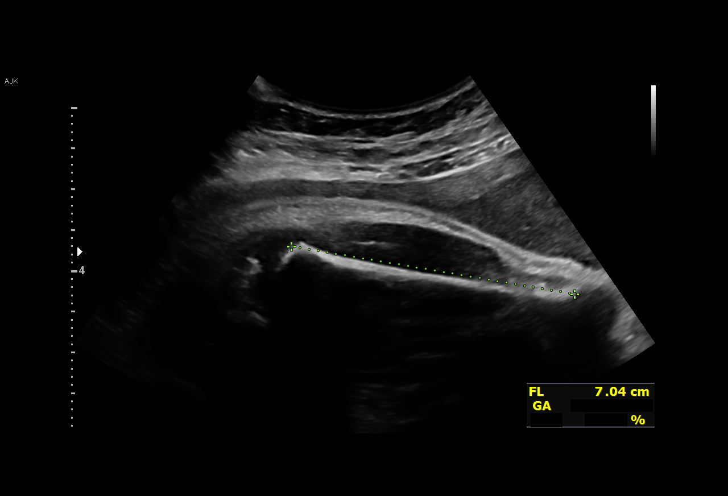
[im 21/51]
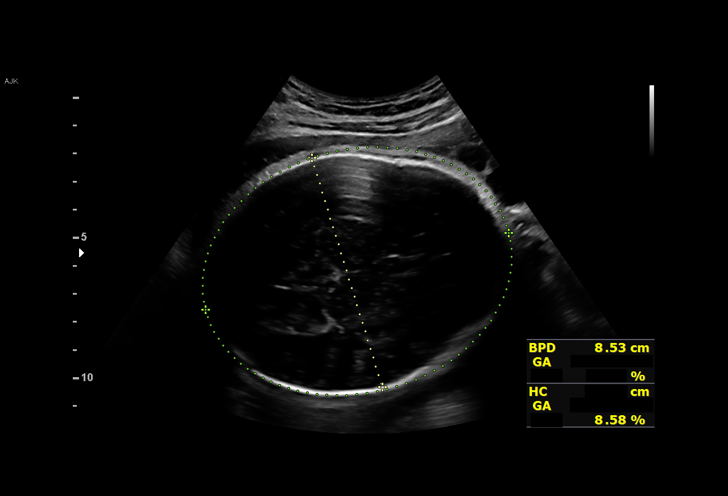
[im 25/51]
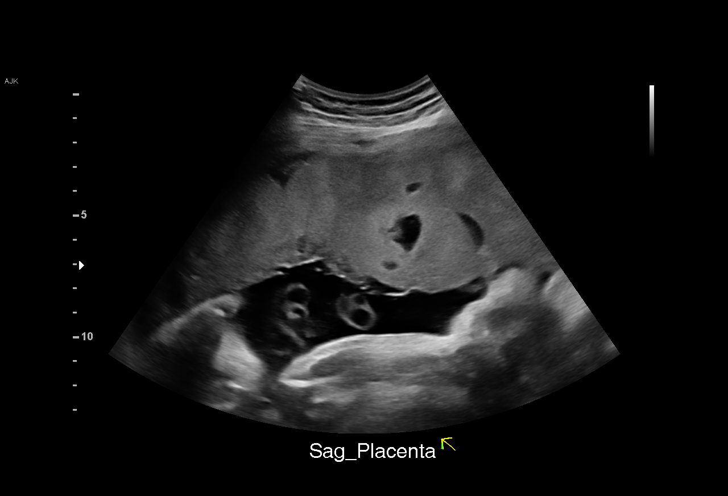
[im 28/51]
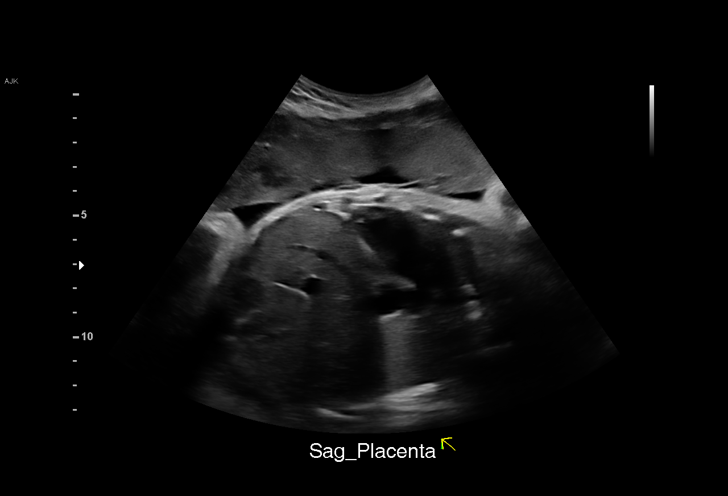
[im 32/51]
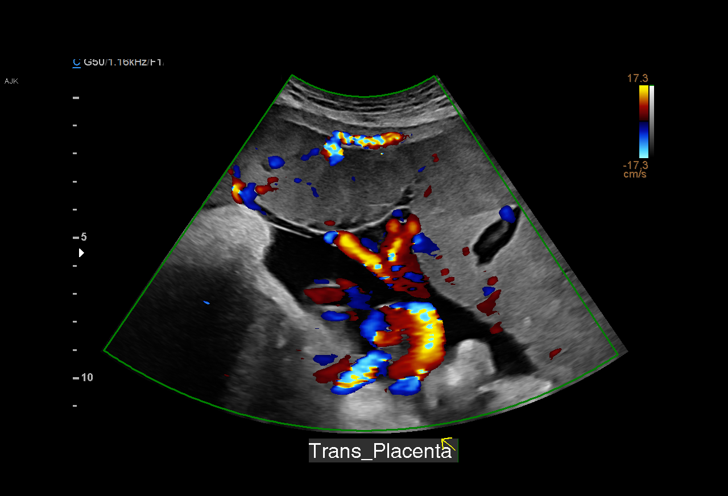
[im 36/51]
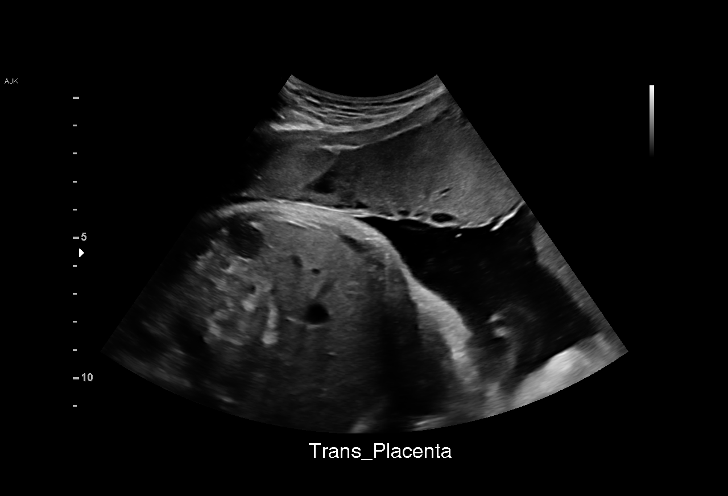
[im 39/51]
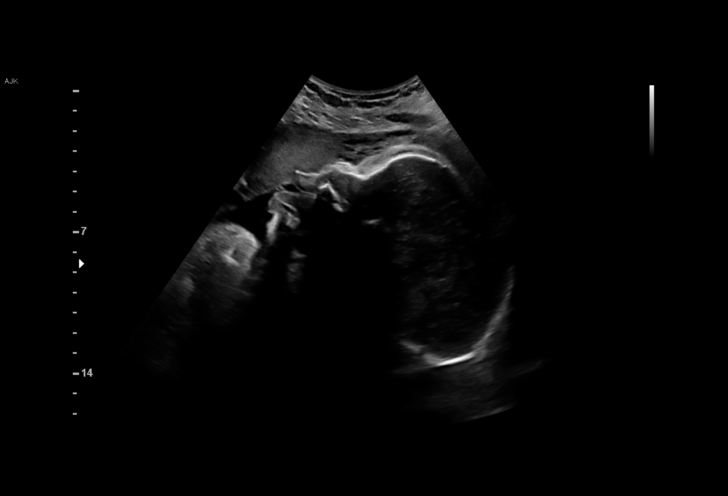
[im 43/51]
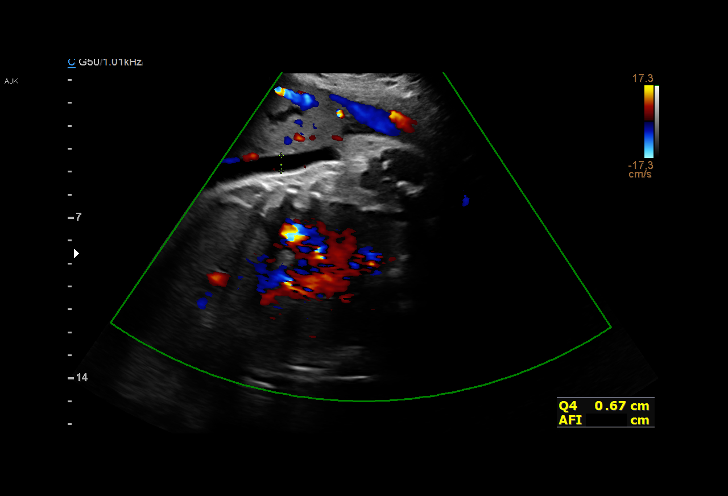
[im 47/51]
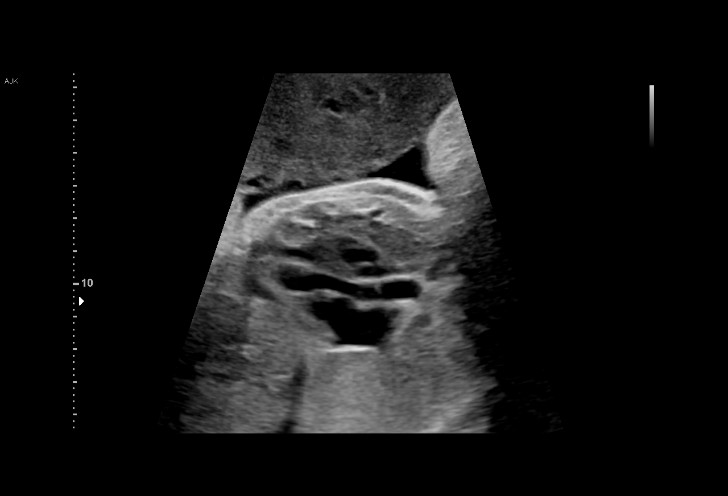
[im 51/51]
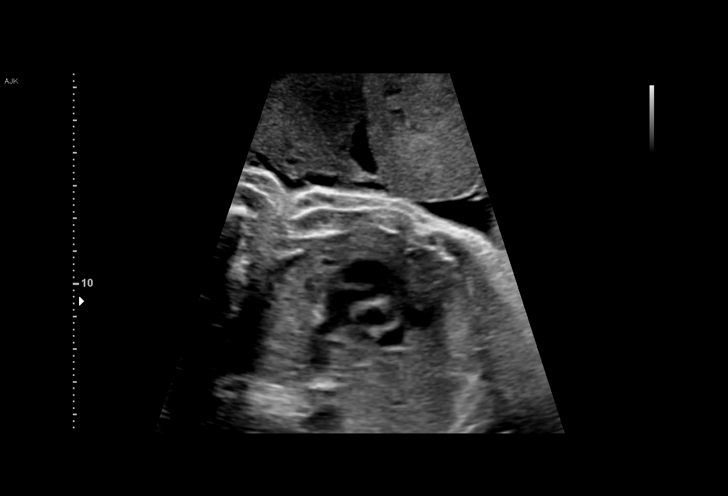

[14 of 28 positions shown; findings below may reference images not displayed]

STRESS                                            TANEKA
                                                       TANEKA
 ----------------------------------------------------------------------

 ----------------------------------------------------------------------
Indications

  Pre-existing diabetes, type 2, in pregnancy,
  third trimester (insulin)
  Obesity complicating pregnancy, third
  trimester
  Uterine fibroids affecting pregnancy in third  O34.13,
  trimester, antepartum
  35 weeks gestation of pregnancy
 ----------------------------------------------------------------------
Fetal Evaluation

 Num Of Fetuses:         1
 Fetal Heart Rate(bpm):  149
 Cardiac Activity:       Observed
 Presentation:           Cephalic
 Placenta:               Anterior
 P. Cord Insertion:      Visualized, central

 Amniotic Fluid
 AFI FV:      Within normal limits

 AFI Sum(cm)     %Tile       Largest Pocket(cm)
 11.91           36

 RUQ(cm)       RLQ(cm)       LUQ(cm)        LLQ(cm)

Biophysical Evaluation
 Amniotic F.V:   Within normal limits       F. Tone:        Observed
 F. Movement:    Observed                   Score:          [DATE]
 F. Breathing:   Observed
Biometry

 BPD:        85  mm     G. Age:  34w 2d         15  %    CI:        74.28   %    70 - 86
                                                         FL/HC:      22.6   %    20.1 -
 HC:      313.1  mm     G. Age:  35w 1d          8  %    HC/AC:      0.91        0.93 -
 AC:      343.9  mm     G. Age:  38w 2d         98  %    FL/BPD:     83.2   %    71 - 87
 FL:       70.7  mm     G. Age:  36w 2d         55  %    FL/AC:      20.6   %    20 - 24

 Est. FW:    0000  gm    6 lb 13 oz      80  %
OB History

 Gravidity:    4         Term:   3
 Living:       3
Gestational Age

 LMP:           35w 6d        Date:  03/27/19                 EDD:   01/01/20
 U/S Today:     36w 0d                                        EDD:   12/31/19
 Best:          35w 6d     Det. By:  LMP  (03/27/19)          EDD:   01/01/20
Anatomy

 Cranium:               Appears normal         LVOT:                   Appears normal
 Cavum:                 Appears normal         Aortic Arch:            Previously seen
 Ventricles:            Previously seen        Ductal Arch:            Previously seen
 Choroid Plexus:        Previously seen        Diaphragm:              Appears normal
 Cerebellum:            Previously seen        Stomach:                Appears normal, left
                                                                       sided
 Posterior Fossa:       Previously seen        Abdomen:                Appears normal
 Nuchal Fold:           Previously seen        Abdominal Wall:         Previously seen
 Face:                  Orbits and profile     Cord Vessels:           Previously seen
                        previously seen
 Lips:                  Previously seen        Kidneys:                Appear normal
 Palate:                Not well visualized    Bladder:                Appears normal
 Thoracic:              Appears normal         Spine:                  Previously seen
 Heart:                 Previously seen        Upper Extremities:      Previously seen
 RVOT:                  Previously seen        Lower Extremities:      Previously seen

 Other:  Heels prev visualized previously.  Nasal bone visualized previously.
Comments

 This patient was seen for a follow up growth scan and
 biophysical profile due to pregestational diabetes.
 The fetal growth and amniotic fluid level appears appropriate
 for her gestational age.
 A biophysical profile performed today was [DATE].
 She will return in 1 week for another biophysical profile.

## 2021-08-02 ENCOUNTER — Emergency Department (HOSPITAL_COMMUNITY)
Admission: EM | Admit: 2021-08-02 | Discharge: 2021-08-02 | Disposition: A | Discharge status: Home / Self Care | Payer: 59 | Attending: Emergency Medicine | Admitting: Emergency Medicine

## 2021-08-02 ENCOUNTER — Emergency Department (HOSPITAL_COMMUNITY): Payer: 59

## 2021-08-02 ENCOUNTER — Other Ambulatory Visit: Payer: Self-pay

## 2021-08-02 DIAGNOSIS — E119 Type 2 diabetes mellitus without complications: Secondary | ICD-10-CM | POA: Insufficient documentation

## 2021-08-02 DIAGNOSIS — Z794 Long term (current) use of insulin: Secondary | ICD-10-CM | POA: Diagnosis not present

## 2021-08-02 DIAGNOSIS — M79672 Pain in left foot: Secondary | ICD-10-CM | POA: Diagnosis present

## 2021-08-02 DIAGNOSIS — Z7984 Long term (current) use of oral hypoglycemic drugs: Secondary | ICD-10-CM | POA: Diagnosis not present

## 2021-08-02 MED ORDER — COLCHICINE 0.6 MG PO TABS
0.6000 mg | ORAL_TABLET | Freq: Every day | ORAL | 1 refills | Status: DC
Start: 2021-08-02 — End: 2022-09-28

## 2021-08-02 MED ORDER — IBUPROFEN 800 MG PO TABS
800.0000 mg | ORAL_TABLET | Freq: Once | ORAL | Status: AC
  Administered 2021-08-02: 800 mg via ORAL
  Filled 2021-08-02: qty 1

## 2021-08-02 MED ORDER — ACETAMINOPHEN 325 MG PO TABS
650.0000 mg | ORAL_TABLET | Freq: Once | ORAL | Status: AC
  Administered 2021-08-02: 650 mg via ORAL
  Filled 2021-08-02: qty 2

## 2021-08-02 NOTE — Discharge Instructions (Addendum)
Please use Tylenol or ibuprofen for pain.  You may use 600 mg ibuprofen every 6 hours or 1000 mg of Tylenol every 6 hours.  You may choose to alternate between the 2.  This would be most effective.  Not to exceed 4 g of Tylenol within 24 hours.  Not to exceed 3200 mg ibuprofen 24 hours.  As we discussed based on your presentation is possible that you may have gout or pseudogout of the affected foot versus musculoskeletal pain.  The Colchicine that I prescribed should help either way with the inflammation.  Recommend that you follow-up with orthopedics if your pain worsens or fails to improve.  As we discussed there was no evidence of a fracture, or other injury on your x-ray today.

## 2021-08-02 NOTE — ED Provider Notes (Signed)
[facility] EMERGENCY DEPARTMENT Provider Note   CSN: [id number] Arrival date & time: 08/02/21  5284     History Chief Complaint  Patient presents with   Foot Pain    Jasmin Ellis is a 30 y.o. female with past medical history significant for diabetes mellitus who presents with left foot pain, some swelling for the last 2 months.  Patient reports that the pain is sharp in nature, getting worse, does not respond to Tylenol.  Patient reports some pain when she walks.  Patient reports that she was hoping that it would improve, but is not getting any better.  Patient denies any leg pain, shortness of breath, chest pain, recent travel.  Patient reports she last had some Tylenol yesterday.  Patient has no history of gout, pseudogout.   Foot Pain      Past Medical History:  Diagnosis Date   Anemia    Anxiety    Back pain, chronic    s/p fall 8 years ago   Diabetes mellitus in pregnancy in third trimester 08/05/2014   Current Diabetic Medications:  Insulin  [x]  Aspirin 81 mg daily after 12 weeks (? A2/B GDM)  Required Referrals for A1GDM or A2GDM: [ ]  Diabetes Education and Testing Supplies [ ]  Nutrition Cousult  For A2/B GDM or higher classes of DM [ ]  Diabetes Education and Testing Supplies [ ]  Nutrition Counsult [ ]  Fetal ECHO after 20 weeks  [ ]  Eye exam for retina evaluation   Baseline and surveillance lab   Diabetes mellitus without complication ([place])    Fibroids    GBS (group B Streptococcus carrier), +RV culture, currently pregnant 12/09/2019   Obesity in pregnancy 07/03/2019    Patient Active Problem List   Diagnosis Date Noted   Amenorrhea 10/19/2020   IUD (intrauterine device) in place 12/26/2019   Gestational hypertension 12/26/2019   Type 2 diabetes mellitus without complications ([place]) 13/24/4010   Insomnia 03/10/2018   Anxiety with depression 03/10/2018   Back pain, chronic     Past Surgical History:  Procedure Laterality Date   NO PAST  SURGERIES       OB History     Gravida  4   Para  4   Term  4   Preterm  0   AB  0   Living  4      SAB  0   IAB  0   Ectopic  0   Multiple  0   Live Births  4           Family History  Problem Relation Age of Onset   Diabetes Father     Social History   Tobacco Use   Smoking status: Never   Smokeless tobacco: Never  Vaping Use   Vaping Use: Never used  Substance Use Topics   Alcohol use: No   Drug use: No    Home Medications Prior to Admission medications   Medication Sig Start Date End Date Taking? Authorizing Provider  colchicine 0.6 MG tablet Take 1 tablet (0.6 mg total) by mouth daily. 08/02/21  Yes Ly Wass H, PA-C  acetaminophen (TYLENOL) 325 MG tablet Take 650 mg by mouth every 6 (six) hours as needed for mild pain or headache.    [provider]  calcium carbonate (TUMS - DOSED IN MG ELEMENTAL CALCIUM) 500 MG chewable tablet Chew 1 tablet (200 mg of elemental calcium total) by mouth daily. 02/26/20   Shirley, [place], DO  cetirizine ([place])  10 MG tablet Take 1 tablet (10 mg total) by mouth daily. 06/28/20 07/28/20  Corena Herter, PA-C  citalopram (CELEXA) 20 MG tablet Take 1 tablet (20 mg total) by mouth daily. 03/03/20   Shirley, [place], DO  enalapril (VASOTEC) 10 MG tablet Take 1 tablet (10 mg total) by mouth daily. 01/29/20   Shirley, [place], DO  fluticasone (FLONASE) 50 MCG/ACT nasal spray Place 1 spray into both nostrils daily. 06/28/20   Corena Herter, PA-C  hydrochlorothiazide (HYDRODIURIL) 25 MG tablet Take 1 tablet (25 mg total) by mouth daily. 01/29/20   Shirley, [place], DO  hydrOXYzine (ATARAX/VISTARIL) 10 MG tablet Take 1 tablet (10 mg total) by mouth 3 (three) times daily as needed for anxiety. 04/06/21   Shary Key, DO  metFORMIN (GLUCOPHAGE) 500 MG tablet Take 1 tablet (500 mg total) by mouth 2 (two) times daily with a meal. 01/29/20   Enid Derry, [place], DO  pantoprazole (PROTONIX) 40 MG tablet Take 1  tablet (40 mg total) by mouth daily. 02/26/20   Shirley, [place], DO  Prenatal Vit-Fe Fumarate-FA (MULTIVITAMIN-PRENATAL) 27-0.8 MG TABS tablet Take 1 tablet by mouth daily at 12 noon. 07/03/19   Emily Filbert, MD  insulin NPH Human (HUMULIN N) 100 UNIT/ML injection Inject before breakfast (10 units) and before supper (8 units) Patient not taking: Reported on 01/01/2020 11/19/19 01/02/20  Aletha Halim, MD    Allergies    Patient has no known allergies.  Review of Systems   Review of Systems  Musculoskeletal:        Left foot pain, joint swelling  All other systems reviewed and are negative.  Physical Exam Updated Vital Signs BP (!) 150/99 (BP Location: Left Arm)   Pulse 81   Temp 98.8 F (37.1 C) (Oral)   Resp 16   LMP 07/21/2021   SpO2 100%   Physical Exam Vitals and nursing note reviewed.  Constitutional:      General: She is not in acute distress.    Appearance: Normal appearance.  HENT:     Head: Normocephalic and atraumatic.  Eyes:     General:        Right eye: No discharge.        Left eye: No discharge.  Cardiovascular:     Rate and Rhythm: Normal rate and regular rhythm.  Pulmonary:     Effort: Pulmonary effort is normal. No respiratory distress.  Musculoskeletal:        General: No deformity.     Comments: Some swelling, tenderness on the dorsal aspect of the left midfoot.  Minimal to no redness, no purulent drainage.  No tenderness to palpation of bilateral calves, or legs.  Patient can walk without difficulty, with some pain.  No sensory deficit of the entire left foot.  No step-offs or deformity palpated.  Skin:    General: Skin is warm and dry.  Neurological:     Mental Status: She is alert and oriented to person, place, and time.  Psychiatric:        Mood and Affect: Mood normal.        Behavior: Behavior normal.    ED Results / Procedures / Treatments   Labs (all labs ordered are listed, but only abnormal results are displayed) Labs Reviewed - No  data to display  EKG None  Radiology DG Foot Complete Left  Result Date: 08/02/2021 CLINICAL DATA:  Foot pain EXAM: LEFT FOOT - COMPLETE 3+ VIEW COMPARISON:  None. FINDINGS: There is no evidence  of acute fracture or dislocation. There is no significant degenerative change. IMPRESSION: Negative left foot radiographs. Electronically Signed   By: Maurine Simmering M.D.   On: 08/02/2021 11:35    Procedures Procedures   Medications Ordered in ED Medications  ibuprofen (ADVIL) tablet 800 mg (800 mg Oral Given 08/02/21 1324)  acetaminophen (TYLENOL) tablet 650 mg (650 mg Oral Given 08/02/21 1324)    ED Course  I have reviewed the triage vital signs and the nursing notes.  Pertinent labs & imaging results that were available during my care of the patient were reviewed by me and considered in my medical decision making (see chart for details).    MDM Rules/Calculators/A&P                         Chronic MSK injury of the left midfoot versus gout versus pseudogout.  Not enough of a joint effusion to consider fluid aspiration.  Patient with significant improvement of pain with ibuprofen, Tylenol in the office today.  Patient with no difficulties with gait, neurovascularly intact.  Minimal to no concern for infection.  No clinical concern for DVT.  Recommend conservative treatment with epinephrine, Tylenol, colchicine, rest, and follow-up with orthopedics.  Patient understands and agrees to plan.  Discharged in stable condition.  Return precautions are given. Final Clinical Impression(s) / ED Diagnoses Final diagnoses:  Foot pain, left    Rx / DC Orders ED Discharge Orders          Ordered    colchicine 0.6 MG tablet  Daily        08/02/21 1413             Tamaya Pun, [place] H, PA-C 08/02/21 1424    Daleen Bo, MD 08/02/21 2049

## 2021-08-02 NOTE — ED Provider Notes (Signed)
Emergency Medicine Provider Triage Evaluation Note  Jasmin Ellis , a 30 y.o. female  was evaluated in triage.  Pt complains of left foot pain, some swelling for the last 2 months.  Patient reports it is getting worse, does not respond to Tylenol.  Patient reports some pain when she walks.  Patient reports that she was hoping that it would improve, but it is not getting any better, patient denies any leg pain, shortness of breath, chest pain, recent travel.  Review of Systems  Positive: Foot pain Negative: Chest pain, shortness of breath  Physical Exam  BP (!) 150/99 (BP Location: Left Arm)   Pulse 81   Temp 98.8 F (37.1 C) (Oral)   Resp 16   SpO2 100%  Gen:   Awake, no distress   Resp:  Normal effort  MSK:   Moves extremities without difficulty  Other:  Tenderness to palpation, some swelling of the left foot on the medial aspect of the midfoot  Medical Decision Making  Medically screening exam initiated at 10:44 AM.  Appropriate orders placed.  Jasmin Ellis was informed that the remainder of the evaluation will be completed by another provider, this initial triage assessment does not replace that evaluation, and the importance of remaining in the ED until their evaluation is complete.  Foot pain   Jasmin Ellis 08/02/21 1045    Daleen Bo, MD 08/02/21 2049

## 2021-08-02 NOTE — ED Triage Notes (Signed)
Pt. Stated, Jasmin Ellis had this foot pain for 2 months, I want to make sure its nothing serious. The pain is on top of my foot.

## 2021-09-26 NOTE — Progress Notes (Signed)
    SUBJECTIVE:   CHIEF COMPLAINT / HPI:   Discuss IUD Removal Bonne Rusk is a 30 y.o. female who presents to the clinic to discuss IUD removal. Patient had post-placental Lilleta IUD inserted in March 2021. States that with this IUD she is having a period every month that lasts about 15-days. It is only heavy on one day, otherwise seems to just be spotting. No cramping or abdominal pains with it.   States that she had a "10-year IUD" previously when she had her son and did better with this. Her periods would come but only lasted 5 days. She is interested in getting this again.    PERTINENT  PMH / PSH:  Past Medical History:  Diagnosis Date   Anemia    Anxiety    Back pain, chronic    s/p fall 8 years ago   Diabetes mellitus in pregnancy in third trimester 08/05/2014   Current Diabetic Medications:  Insulin  [x]  Aspirin 81 mg daily after 12 weeks (? A2/B GDM)  Required Referrals for A1GDM or A2GDM: [ ]  Diabetes Education and Testing Supplies [ ]  Nutrition Cousult  For A2/B GDM or higher classes of DM [ ]  Diabetes Education and Testing Supplies [ ]  Nutrition Counsult [ ]  Fetal ECHO after 20 weeks  [ ]  Eye exam for retina evaluation   Baseline and surveillance lab   Diabetes mellitus without complication ([place])    Fibroids    GBS (group B Streptococcus carrier), +RV culture, currently pregnant 12/09/2019   Obesity in pregnancy 07/03/2019   OBJECTIVE:   BP (!) 141/80   Pulse 79   Ht 5\' 4"  (1.626 m)   Wt 200 lb (90.7 kg)   SpO2 99%   BMI 34.33 kg/m    General: NAD, pleasant, able to participate in exam Respiratory: Breathing comfortably on room air, no respiratory distress Psych: Normal affect and mood  ASSESSMENT/PLAN:   IUD (intrauterine device) in place Post placental Liletta IUD placed in March 2021.  Patient reports frequent spotting with this.  Has been in for over a year and a half at this point.  She is interested in switching back to the copper IUD which worked  better for her.  She will schedule upfront for IUD removal and reinsertion.  We briefly discussed other birth control options as well but she feels IUD will be best given its long duration.  Discussed with RN Jarrett Soho to have this ordered.  Medication refill Requested a medication refill for her "depression" medication but she was unclear which medication this was.  We went through her medication list together and she did not seem to recognize it on there.  Requested that she call in when she is able to find out which medication this is.     Sharion Settler, [place]

## 2021-09-26 NOTE — Patient Instructions (Addendum)
It was wonderful to see you today.  Please bring ALL of your medications with you to every visit.   Today we talked about:  -Please schedule an appointment to have your IUD removed and re-inserted. This is generally a longer appointment slot because it is a procedure.  Thank you for choosing [facility].   Please call [phone] with any questions about today's appointment.  Please be sure to schedule follow up at the front  desk before you leave today.   Sharion Settler, DO PGY-2 Family Medicine

## 2021-09-27 ENCOUNTER — Other Ambulatory Visit: Payer: Self-pay

## 2021-09-27 ENCOUNTER — Encounter: Payer: Self-pay | Admitting: Family Medicine

## 2021-09-27 ENCOUNTER — Ambulatory Visit: Payer: 59 | Admitting: Family Medicine

## 2021-09-27 DIAGNOSIS — Z975 Presence of (intrauterine) contraceptive device: Secondary | ICD-10-CM | POA: Diagnosis not present

## 2021-09-27 DIAGNOSIS — Z760 Encounter for issue of repeat prescription: Secondary | ICD-10-CM | POA: Diagnosis not present

## 2021-09-27 NOTE — Assessment & Plan Note (Signed)
Requested a medication refill for her "depression" medication but she was unclear which medication this was.  We went through her medication list together and she did not seem to recognize it on there.  Requested that she call in when she is able to find out which medication this is.

## 2021-09-27 NOTE — Assessment & Plan Note (Signed)
Post placental Liletta IUD placed in March 2021.  Patient reports frequent spotting with this.  Has been in for over a year and a half at this point.  She is interested in switching back to the copper IUD which worked better for her.  She will schedule upfront for IUD removal and reinsertion.  We briefly discussed other birth control options as well but she feels IUD will be best given its long duration.  Discussed with RN Jarrett Soho to have this ordered.

## 2021-10-07 NOTE — Patient Instructions (Signed)
Intrauterine Device Insertion, Care After This sheet gives you information about how to care for yourself after your procedure. Your health care provider may also give you more specific instructions. If you have problems or questions, contact your health care provider. What can I expect after the procedure? After the procedure, it is common to have: Cramps and pain in the abdomen. Bleeding. It may be light or heavy. This may last for a few days. Lower back pain. Dizziness. Headaches. Nausea. Follow these instructions at home:  Before resuming sexual activity, check to make sure that you can feel the IUD string or strings. You should be able to feel the end of the string below the opening of your cervix. If your IUD string is in place, you may resume sexual activity. If you had a hormonal IUD inserted more than 7 days after your most recent period started, you will need to use a backup method of birth control for 7 days after IUD insertion. Ask your health care provider whether this applies to you. Continue to check that the IUD is still in place by feeling for the strings after every menstrual period, or once a month. An IUD will not protect you from sexually transmitted infections (STIs). Use methods to prevent the exchange of body fluids between partners (barrier protection) every time you have sex. Barrier protection can be used during oral, vaginal, or anal sex. Commonly used barrier methods include: Female condom. Female condom. Dental dam. Take over-the-counter and prescription medicines only as told by your health care provider. Keep all follow-up visits as told by your health care provider. This is important. Contact a health care provider if: You feel light-headed or weak. You have any of the following problems with your IUD string or strings: The string bothers or hurts you or your sexual partner. You cannot feel the string. The string has gotten longer. You can feel the IUD in  your vagina. You think you may be pregnant, or you miss your menstrual period. You think you may have a sexually transmitted infection (STI). Get help right away if: You have flu-like symptoms, such as tiredness (fatigue) and muscle aches. You have a fever and chills. You have bleeding that is heavier or lasts longer than a normal menstrual cycle. You have abnormal or bad-smelling discharge from your vagina. You develop abdominal pain that is new, is getting worse, or is not in the same area of earlier cramping and pain. You have pain during sexual activity. Summary After the procedure, it is common to have cramps and pain in the abdomen. It is also common to have light bleeding or heavier bleeding that is like your menstrual period. Continue to check that the IUD is still in place by feeling for the strings after every menstrual period, or once a month. Keep all follow-up visits as told by your health care provider. This is important. Contact your health care provider if you have problems with your IUD strings, such as the string getting longer or bothering you or your sexual partner. This information is not intended to replace advice given to you by your health care provider. Make sure you discuss any questions you have with your health care provider. Document Revised: 09/24/2019 Document Reviewed: 09/24/2019 Elsevier Patient Education  2022 Elsevier Inc.  

## 2021-10-07 NOTE — Progress Notes (Signed)
° ° °  OFFICE PROCEDURE NOTE  Jasmin Ellis is a 30 y.o. [id number] here for [place] IUD removal and Paraguard insertion. No GYN concerns.  Reports no vaginal discharge. Amenable to STI screening. Last pap smear was on 06/11/2019 and was normal (HPV co-testing also performed and negative at that time).  Physical Exam Blood pressure 140/85, pulse 86, weight 199 lb (90.3 kg), last menstrual period 09/07/2021, not currently breastfeeding.   General: Awake, alert, oriented, in no acute distress, pleasant and cooperative with examination Respiratory: Breathing comfortably on room air, in no respiratory distress Abdomen: Soft, non-tender to palpation of all quadrants, non-distended GU: Normal appearance of labia majora and minora, without lesions. Vagina tissue pink, moist, without lesions or abrasions. Cervix normal appearance, non-friable, without discharge from os. IUD strings visible. Psych: Normal mood and affect  GU Exam chaperoned by Andreas Newport   IUD Removal and Reinsertion  Patient identified, informed consent performed, consent signed.   Discussed risks of irregular bleeding, cramping, infection, malpositioning or misplacement of the IUD outside the uterus which may require further procedures. Also discussed >99% contraception efficacy, increased risk of ectopic pregnancy with failure of method.   Emphasized that this did not protect against STIs, condoms recommended during all sexual encounters.Advised to use backup contraception for one week as the risk of pregnancy is higher during the transition period of removing an IUD and replacing it with another one. Time out was performed. Speculum placed in the vagina. The strings of the IUD were grasped and pulled using ring forceps. Patient had mild difficulty with IUD removal and Dr. Thompson Grayer was called to the room to assist. IUD was able to be removed without complication in its entirety. The cervix was cleaned with Betadine x 2 and grasped  anteriorly with a single tooth tenaculum.  The new Paraguard IUD insertion apparatus was used to sound the uterus to 6 cm;  the IUD was then placed per manufacturer's recommendations. Strings trimmed to 3 cm. Tenaculum was removed, good hemostasis noted. Patient tolerated procedure well.   Patient was given post-procedure instructions.  She was reminded to have backup contraception for one week during this transition period between IUDs.  Patient was also asked to check IUD strings periodically and follow up in 4 weeks for IUD check.  Anxiety States that she is well controlled on Hydroxyzine. Takes PRN. Requests refill. -Refill sent    Sharion Settler, DO Family Medicine Resident

## 2021-10-08 ENCOUNTER — Other Ambulatory Visit (HOSPITAL_COMMUNITY)
Admission: RE | Admit: 2021-10-08 | Discharge: 2021-10-08 | Disposition: A | Discharge status: Home / Self Care | Payer: 59 | Source: Ambulatory Visit | Attending: Family Medicine | Admitting: Family Medicine

## 2021-10-08 ENCOUNTER — Ambulatory Visit: Payer: 59 | Admitting: Family Medicine

## 2021-10-08 ENCOUNTER — Other Ambulatory Visit: Payer: Self-pay

## 2021-10-08 ENCOUNTER — Encounter: Payer: Self-pay | Admitting: Family Medicine

## 2021-10-08 VITALS — BP 140/85 | HR 86 | Wt 199.0 lb

## 2021-10-08 DIAGNOSIS — Z30433 Encounter for removal and reinsertion of intrauterine contraceptive device: Secondary | ICD-10-CM

## 2021-10-08 DIAGNOSIS — Z113 Encounter for screening for infections with a predominantly sexual mode of transmission: Secondary | ICD-10-CM | POA: Insufficient documentation

## 2021-10-08 DIAGNOSIS — F418 Other specified anxiety disorders: Secondary | ICD-10-CM | POA: Diagnosis not present

## 2021-10-08 MED ORDER — HYDROXYZINE HCL 10 MG PO TABS: 10.0000 mg | ORAL_TABLET | Freq: Three times a day (TID) | ORAL | 2 refills | Status: DC | PRN

## 2021-10-12 ENCOUNTER — Encounter: Payer: Self-pay | Admitting: Family Medicine

## 2021-10-12 LAB — CERVICOVAGINAL ANCILLARY ONLY
Bacterial Vaginitis (gardnerella): NEGATIVE
Candida Glabrata: NEGATIVE
Candida Vaginitis: NEGATIVE
Chlamydia: NEGATIVE
Comment: NEGATIVE
Comment: NEGATIVE
Comment: NEGATIVE
Comment: NEGATIVE
Comment: NEGATIVE
Comment: NORMAL
Neisseria Gonorrhea: NEGATIVE
Trichomonas: NEGATIVE

## 2021-12-14 MED ORDER — PARAGARD INTRAUTERINE COPPER IU IUD
1.0000 | INTRAUTERINE_SYSTEM | Freq: Once | INTRAUTERINE | Status: AC
  Administered 2021-10-08: 1 via INTRAUTERINE

## 2021-12-14 NOTE — Addendum Note (Signed)
Addended by: Talbot Grumbling on: 12/14/2021 10:53 AM   Modules accepted: Orders

## 2022-03-22 ENCOUNTER — Encounter: Payer: Self-pay | Admitting: *Deleted

## 2022-07-25 ENCOUNTER — Ambulatory Visit (HOSPITAL_COMMUNITY)
Admission: EM | Admit: 2022-07-25 | Discharge: 2022-07-25 | Disposition: A | Discharge status: Home / Self Care | Payer: Medicaid Other | Attending: Internal Medicine | Admitting: Internal Medicine

## 2022-07-25 ENCOUNTER — Encounter (HOSPITAL_COMMUNITY): Payer: Self-pay

## 2022-07-25 DIAGNOSIS — R42 Dizziness and giddiness: Secondary | ICD-10-CM

## 2022-07-25 DIAGNOSIS — R519 Headache, unspecified: Secondary | ICD-10-CM | POA: Diagnosis not present

## 2022-07-25 DIAGNOSIS — E860 Dehydration: Secondary | ICD-10-CM | POA: Diagnosis not present

## 2022-07-25 MED ORDER — DEXAMETHASONE SODIUM PHOSPHATE 10 MG/ML IJ SOLN: 10.0000 mg | Freq: Once | INTRAMUSCULAR | Status: DC

## 2022-07-25 MED ORDER — DEXAMETHASONE SODIUM PHOSPHATE 10 MG/ML IJ SOLN
5.0000 mg | Freq: Once | INTRAMUSCULAR | Status: AC
  Administered 2022-07-25: 5 mg via INTRAMUSCULAR

## 2022-07-25 MED ORDER — DEXAMETHASONE SODIUM PHOSPHATE 10 MG/ML IJ SOLN
INTRAMUSCULAR | Status: AC
  Filled 2022-07-25: qty 1

## 2022-07-25 MED ORDER — IBUPROFEN 600 MG PO TABS: 600.0000 mg | ORAL_TABLET | Freq: Four times a day (QID) | ORAL | 0 refills | Status: DC | PRN

## 2022-07-25 MED ORDER — ONDANSETRON 4 MG PO TBDP
ORAL_TABLET | ORAL | Status: AC
  Filled 2022-07-25: qty 1

## 2022-07-25 MED ORDER — KETOROLAC TROMETHAMINE 30 MG/ML IJ SOLN
30.0000 mg | Freq: Once | INTRAMUSCULAR | Status: AC
  Administered 2022-07-25: 30 mg via INTRAMUSCULAR

## 2022-07-25 MED ORDER — KETOROLAC TROMETHAMINE 30 MG/ML IJ SOLN
INTRAMUSCULAR | Status: AC
  Filled 2022-07-25: qty 1

## 2022-07-25 MED ORDER — ONDANSETRON 4 MG PO TBDP
4.0000 mg | ORAL_TABLET | Freq: Once | ORAL | Status: AC
  Administered 2022-07-25: 4 mg via ORAL

## 2022-07-25 MED ORDER — ONDANSETRON 4 MG PO TBDP: 4.0000 mg | ORAL_TABLET | Freq: Three times a day (TID) | ORAL | 0 refills | Status: DC | PRN

## 2022-07-25 NOTE — Discharge Instructions (Signed)
We gave you some medicines in the clinic today to help with your head pain.  We also gave you a tablet to go underneath your tongue to help with nausea called Zofran.  You may take this every 8 hours as needed at home for nausea and vomiting.  You may start taking ibuprofen tomorrow morning if needed for headache.  Do not take any ibuprofen tonight since you were given a stronger version of ibuprofen in your shot that you got urgent care today.  I would like for you to call your primary care provider to schedule an appointment to be able to discuss your anxiety and depression symptoms and to discuss medication options for this.  You've got this, hang in there!   If you develop any new or worsening symptoms or do not improve in the next 2 to 3 days, please return.  If your symptoms are severe, please go to the emergency room.  Follow-up with your primary care provider for further evaluation and management of your symptoms as well as ongoing wellness visits.  I hope you feel better!

## 2022-07-25 NOTE — ED Provider Notes (Signed)
[place]    CSN: [id number] Arrival date & time: 07/25/22  1741      History   Chief Complaint Chief Complaint  Patient presents with   Migraine   Dizziness    HPI Jasmin Ellis is a 31 y.o. female.   Patient presents urgent care for evaluation of headache, dizziness, and "not feeling like herself" for the last 3 days.  Headache is to the back of her head and currently a 10 on a scale of 0-10.  She states that she gets this way when she gets really depressed and has "had a lot going on at home" lately contributing to symptoms.  She is experiencing thought racing especially at nighttime which is causing her to wake up at around 3 or 4 AM and not be able to go back to sleep.  She is also experiencing increased worry and sadness causing her to have a decreased appetite leading to nausea.  She is currently mildly dizzy at this time and states that this is worse with movement/position changes.  Last time eating was today at 4 PM.  She denies diagnosis of diabetes.  She used to take medications for depression and anxiety but stopped taking these 1 year ago.  She states that she has 4 children at home and her husband travels quite frequently so she is left alone with the kids to take care of them by herself.  This is very stressful and she alludes to further stressful events in her life recently as well.  She has a history of migraine headache but states that this does not feel similar to her past migraines.  A couple of days ago, she felt "faint" but did not pass out.  Denies recent falls, head trauma, vision changes, ear pain, URI symptoms, fever/chills, neck pain, abdominal discomfort, back pain, and urinary symptoms.  She has had 1 bottle of water to drink today and states that this is far less than her normal oral intake.  She denies intake of caffeinated beverages.  She has not attempted use of any over-the-counter medications for her head pain prior to arrival urgent care. Denies  SI/HI.   Migraine  Dizziness   Past Medical History:  Diagnosis Date   Anemia    Anxiety    Back pain, chronic    s/p fall 8 years ago   Diabetes mellitus in pregnancy in third trimester 08/05/2014   Current Diabetic Medications:  Insulin  '[x]'$  Aspirin 81 mg daily after 12 weeks (? A2/B GDM)  Required Referrals for A1GDM or A2GDM: '[ ]'$  Diabetes Education and Testing Supplies '[ ]'$  Nutrition Cousult  For A2/B GDM or higher classes of DM '[ ]'$  Diabetes Education and Testing Supplies '[ ]'$  Nutrition Counsult '[ ]'$  Fetal ECHO after 20 weeks  '[ ]'$  Eye exam for retina evaluation   Baseline and surveillance lab   Diabetes mellitus without complication ([place])    Fibroids    GBS (group B Streptococcus carrier), +RV culture, currently pregnant 12/09/2019   Obesity in pregnancy 07/03/2019    Patient Active Problem List   Diagnosis Date Noted   Medication refill 09/27/2021   Amenorrhea 10/19/2020   IUD (intrauterine device) in place 12/26/2019   Gestational hypertension 12/26/2019   Type 2 diabetes mellitus without complications ([place]) 40/98/1191   Insomnia 03/10/2018   Anxiety with depression 03/10/2018   Back pain, chronic     Past Surgical History:  Procedure Laterality Date   NO PAST SURGERIES  OB History     Gravida  4   Para  4   Term  4   Preterm  0   AB  0   Living  4      SAB  0   IAB  0   Ectopic  0   Multiple  0   Live Births  4            Home Medications    Prior to Admission medications   Medication Sig Start Date End Date Taking? Authorizing Provider  ibuprofen (ADVIL) 600 MG tablet Take 1 tablet (600 mg total) by mouth every 6 (six) hours as needed. 07/25/22  Yes Talbot Grumbling, FNP  ondansetron (ZOFRAN-ODT) 4 MG disintegrating tablet Take 1 tablet (4 mg total) by mouth every 8 (eight) hours as needed for nausea or vomiting. 07/25/22  Yes Talbot Grumbling, FNP  acetaminophen (TYLENOL) 325 MG tablet Take 650 mg by mouth every 6 (six)  hours as needed for mild pain or headache.    [provider]  calcium carbonate (TUMS - DOSED IN MG ELEMENTAL CALCIUM) 500 MG chewable tablet Chew 1 tablet (200 mg of elemental calcium total) by mouth daily. Patient not taking: Reported on 09/27/2021 02/26/20   Shirley, [place], DO  cetirizine (ZYRTEC) 10 MG tablet Take 1 tablet (10 mg total) by mouth daily. 06/28/20 07/28/20  Corena Herter, PA-C  citalopram (CELEXA) 20 MG tablet Take 1 tablet (20 mg total) by mouth daily. 03/03/20   Shirley, [place], DO  colchicine 0.6 MG tablet Take 1 tablet (0.6 mg total) by mouth daily. Patient not taking: Reported on 09/27/2021 08/02/21   Prosperi, Christian H, PA-C  enalapril (VASOTEC) 10 MG tablet Take 1 tablet (10 mg total) by mouth daily. Patient not taking: Reported on 09/27/2021 01/29/20   Shirley, [place], DO  fluticasone [facility]) 50 MCG/ACT nasal spray Place 1 spray into both nostrils daily. Patient not taking: Reported on 09/27/2021 06/28/20   Corena Herter, PA-C  hydrochlorothiazide (HYDRODIURIL) 25 MG tablet Take 1 tablet (25 mg total) by mouth daily. Patient not taking: Reported on 09/27/2021 01/29/20   Shirley, [place], DO  hydrOXYzine (ATARAX) 10 MG tablet Take 1 tablet (10 mg total) by mouth 3 (three) times daily as needed for anxiety. 10/08/21   Sharion Settler, DO  metFORMIN (GLUCOPHAGE) 500 MG tablet Take 1 tablet (500 mg total) by mouth 2 (two) times daily with a meal. 01/29/20   Enid Derry, [place], DO  pantoprazole (PROTONIX) 40 MG tablet Take 1 tablet (40 mg total) by mouth daily. Patient not taking: Reported on 09/27/2021 02/26/20   Shirley, [place], DO  Prenatal Vit-Fe Fumarate-FA (MULTIVITAMIN-PRENATAL) 27-0.8 MG TABS tablet Take 1 tablet by mouth daily at 12 noon. 07/03/19   Emily Filbert, MD  insulin NPH Human (HUMULIN N) 100 UNIT/ML injection Inject before breakfast (10 units) and before supper (8 units) Patient not taking: Reported on 01/01/2020 11/19/19 01/02/20  Aletha Halim,  MD    Family History Family History  Problem Relation Age of Onset   Diabetes Father     Social History Social History   Tobacco Use   Smoking status: Never   Smokeless tobacco: Never  Vaping Use   Vaping Use: Never used  Substance Use Topics   Alcohol use: No   Drug use: No     Allergies   Patient has no known allergies.   Review of Systems Review of Systems  Neurological:  Positive for dizziness.  Per  HPI   Physical Exam Triage Vital Signs ED Triage Vitals  Enc Vitals Group     BP 07/25/22 1903 (!) 132/101     Pulse Rate 07/25/22 1903 67     Resp 07/25/22 1903 16     Temp 07/25/22 1903 99.2 F (37.3 C)     Temp Source 07/25/22 1903 Oral     SpO2 07/25/22 1903 100 %     Weight --      Height --      Head Circumference --      Peak Flow --      Pain Score 07/25/22 1906 10     Pain Loc --      Pain Edu? --      Excl. in [place]? --    No data found.  Updated Vital Signs BP (!) 132/101 (BP Location: Right Arm)   Pulse 67   Temp 99.2 F (37.3 C) (Oral)   Resp 16   LMP 07/25/2022 (Exact Date)   SpO2 100%   Visual Acuity Right Eye Distance:   Left Eye Distance:   Bilateral Distance:    Right Eye Near:   Left Eye Near:    Bilateral Near:     Physical Exam Vitals and nursing note reviewed.  Constitutional:      Appearance: Normal appearance. She is not ill-appearing or toxic-appearing.  HENT:     Head: Normocephalic and atraumatic.     Right Ear: Hearing, tympanic membrane, ear canal and external ear normal.     Left Ear: Hearing, tympanic membrane, ear canal and external ear normal.     Nose: Nose normal.     Mouth/Throat:     Lips: Pink.     Mouth: Mucous membranes are moist.     Pharynx: No posterior oropharyngeal erythema.  Eyes:     General: Lids are normal. Vision grossly intact. Gaze aligned appropriately.        Right eye: No discharge.        Left eye: No discharge.     Extraocular Movements: Extraocular movements intact.      Conjunctiva/sclera: Conjunctivae normal.     Right eye: Right conjunctiva is not injected.     Left eye: Left conjunctiva is not injected.     Pupils: Pupils are equal, round, and reactive to light.     Comments: EOMs intact without dizziness or pain elicited with exam.  Cardiovascular:     Rate and Rhythm: Normal rate and regular rhythm.     Heart sounds: Normal heart sounds, S1 normal and S2 normal.  Pulmonary:     Effort: Pulmonary effort is normal. No respiratory distress.     Breath sounds: Normal breath sounds and air entry.  Musculoskeletal:     Cervical back: Neck supple.  Skin:    General: Skin is warm and dry.     Capillary Refill: Capillary refill takes less than 2 seconds.     Findings: No rash.  Neurological:     General: No focal deficit present.     Mental Status: She is alert and oriented to person, place, and time. Mental status is at baseline.     Cranial Nerves: Cranial nerves 2-12 are intact. No cranial nerve deficit, dysarthria or facial asymmetry.     Sensory: Sensation is intact.     Motor: No weakness or tremor.     Coordination: Coordination is intact. Romberg sign negative. Coordination normal. Finger-Nose-Finger Test normal.     Gait: Gait  is intact. Gait normal.     Comments: No focal neurologic deficit to physical exam. Romberg negative. Cranial nerves intact. 5/5 strength throughout. Sensation intact.  Psychiatric:        Mood and Affect: Mood normal.        Speech: Speech normal.        Behavior: Behavior normal.        Thought Content: Thought content normal.        Judgment: Judgment normal.      UC Treatments / Results  Labs (all labs ordered are listed, but only abnormal results are displayed) Labs Reviewed - No data to display  EKG   Radiology No results found.  Procedures Procedures (including critical care time)  Medications Ordered in UC Medications  ketorolac (TORADOL) 30 MG/ML injection 30 mg (30 mg Intramuscular Given  07/25/22 2009)  ondansetron (ZOFRAN-ODT) disintegrating tablet 4 mg (4 mg Oral Given 07/25/22 2007)  dexamethasone (DECADRON) injection 5 mg (5 mg Intramuscular Given 07/25/22 2008)    Initial Impression / Assessment and Plan / UC Course  I have reviewed the triage vital signs and the nursing notes.  Pertinent labs & imaging results that were available during my care of the patient were reviewed by me and considered in my medical decision making (see chart for details).   1. Bad headache, dizziness, and dehydration Unclear etiology of headache and intermittent dizziness, although likely related to dehydration as symptoms are worse with position changes. Symptoms improved with ketorolac '30mg'$  IM, '5mg'$  decadron IM, and '4mg'$  zofran ODT. Depressive symptoms are likely also playing a role in patient's headache/dizziness such as lack of appetite, difficulty sleeping due to racing thoughts, and recent increased life stressors. Discussed follow-up with PCP to have a conversation regarding antidepressant/anxiolytic medications as I believe she would greatly benefit from one or both of these types of medications to help her begin to feel mentally and physically better overall. She is open to having this discussion with her PCP and expresses agreement to call them in the next few days to schedule an appointment.   Dehydration- advised patient to increase water intake to at least 64 ounces of water per day to stay well hydrated. She is to avoid sugary and caffeinated beverages as this can cause worsening dehydration and even worsening anxiety symptoms.   Advised to eat more regular meals, even if this is simply a small snack, to prevent nausea related to decreased appetite and infrequent eating. Zofran '4mg'$  every 8 hours as needed for nausea may be used to facilitate adequate intake.   Ibuprofen and tylenol over the counter may be used at home as needed for headaches in the future. No clinical indication for ER  referral for further evaluation or advanced imaging based on hemodynamically stable vital signs and stable neurologic findings. No ibuprofen until tomorrow afternoon due to ketorolac and decadron injections in clinic today.  Discussed physical exam and available lab work findings in clinic with patient.  Counseled patient regarding appropriate use of medications and potential side effects for all medications recommended or prescribed today. Discussed red flag signs and symptoms of worsening condition,when to call the PCP office, return to urgent care, and when to seek higher level of care in the emergency department. Patient verbalizes understanding and agreement with plan. All questions answered. Patient discharged in stable condition.    Final Clinical Impressions(s) / UC Diagnoses   Final diagnoses:  Bad headache  Dizziness  Dehydration     Discharge Instructions  We gave you some medicines in the clinic today to help with your head pain.  We also gave you a tablet to go underneath your tongue to help with nausea called Zofran.  You may take this every 8 hours as needed at home for nausea and vomiting.  You may start taking ibuprofen tomorrow morning if needed for headache.  Do not take any ibuprofen tonight since you were given a stronger version of ibuprofen in your shot that you got urgent care today.  I would like for you to call your primary care provider to schedule an appointment to be able to discuss your anxiety and depression symptoms and to discuss medication options for this.  You've got this, hang in there!   If you develop any new or worsening symptoms or do not improve in the next 2 to 3 days, please return.  If your symptoms are severe, please go to the emergency room.  Follow-up with your primary care provider for further evaluation and management of your symptoms as well as ongoing wellness visits.  I hope you feel better!     ED Prescriptions      Medication Sig Dispense Auth. Provider   ibuprofen (ADVIL) 600 MG tablet Take 1 tablet (600 mg total) by mouth every 6 (six) hours as needed. 30 tablet Joella Prince M, FNP   ondansetron (ZOFRAN-ODT) 4 MG disintegrating tablet Take 1 tablet (4 mg total) by mouth every 8 (eight) hours as needed for nausea or vomiting. 20 tablet Talbot Grumbling, FNP      PDMP not reviewed this encounter.   Talbot Grumbling, [place] 07/27/22 1346

## 2022-07-25 NOTE — ED Triage Notes (Signed)
Patient having dizziness, pain in the back of the head and neck. States yesterday she felt like she would faint. Onset 3 days ago. Patient states she is off balance, the room is spinning. Patient has history of migraines and depression.  No new medications or diet changes.

## 2022-08-05 ENCOUNTER — Encounter (HOSPITAL_COMMUNITY): Payer: Self-pay

## 2022-08-05 ENCOUNTER — Emergency Department (HOSPITAL_COMMUNITY)
Admission: EM | Admit: 2022-08-05 | Discharge: 2022-08-06 | Discharge status: Against Medical Advice | Payer: Medicaid Other | Attending: Emergency Medicine | Admitting: Emergency Medicine

## 2022-08-05 ENCOUNTER — Ambulatory Visit (HOSPITAL_COMMUNITY)
Admission: EM | Admit: 2022-08-05 | Discharge: 2022-08-05 | Disposition: A | Discharge status: Home / Self Care | Payer: Medicaid Other | Attending: Internal Medicine | Admitting: Internal Medicine

## 2022-08-05 ENCOUNTER — Encounter (HOSPITAL_COMMUNITY): Payer: Self-pay | Admitting: Emergency Medicine

## 2022-08-05 ENCOUNTER — Other Ambulatory Visit: Payer: Self-pay

## 2022-08-05 DIAGNOSIS — U071 COVID-19: Secondary | ICD-10-CM | POA: Diagnosis not present

## 2022-08-05 DIAGNOSIS — R509 Fever, unspecified: Secondary | ICD-10-CM

## 2022-08-05 DIAGNOSIS — Z5321 Procedure and treatment not carried out due to patient leaving prior to being seen by health care provider: Secondary | ICD-10-CM | POA: Insufficient documentation

## 2022-08-05 DIAGNOSIS — B349 Viral infection, unspecified: Secondary | ICD-10-CM | POA: Diagnosis not present

## 2022-08-05 LAB — CBC
HCT: 35.5 % — ABNORMAL LOW (ref 36.0–46.0)
Hemoglobin: 12 g/dL (ref 12.0–15.0)
MCH: 27.8 pg (ref 26.0–34.0)
MCHC: 33.8 g/dL (ref 30.0–36.0)
MCV: 82.4 fL (ref 80.0–100.0)
Platelets: 300 10*3/uL (ref 150–400)
RBC: 4.31 MIL/uL (ref 3.87–5.11)
RDW: 12.6 % (ref 11.5–15.5)
WBC: 6.3 10*3/uL (ref 4.0–10.5)
nRBC: 0 % (ref 0.0–0.2)

## 2022-08-05 LAB — BASIC METABOLIC PANEL
Anion gap: 11 (ref 5–15)
BUN: 7 mg/dL (ref 6–20)
CO2: 20 mmol/L — ABNORMAL LOW (ref 22–32)
Calcium: 9.2 mg/dL (ref 8.9–10.3)
Chloride: 104 mmol/L (ref 98–111)
Creatinine, Ser: 0.57 mg/dL (ref 0.44–1.00)
GFR, Estimated: >60 mL/min (ref >60)
Glucose, Bld: 287 mg/dL — ABNORMAL HIGH (ref 70–99)
Potassium: 3.4 mmol/L — ABNORMAL LOW (ref 3.5–5.1)
Sodium: 135 mmol/L (ref 135–145)

## 2022-08-05 LAB — LACTIC ACID, PLASMA: Lactic Acid, Venous: 0.9 mmol/L (ref 0.5–1.9)

## 2022-08-05 LAB — SARS CORONAVIRUS 2 BY RT PCR: SARS Coronavirus 2 by RT PCR: POSITIVE — AB

## 2022-08-05 LAB — POC INFLUENZA A AND B ANTIGEN (URGENT CARE ONLY)
INFLUENZA A ANTIGEN, POC: NEGATIVE
INFLUENZA B ANTIGEN, POC: NEGATIVE

## 2022-08-05 MED ORDER — ACETAMINOPHEN 325 MG PO TABS
ORAL_TABLET | ORAL | Status: AC
  Filled 2022-08-05: qty 1

## 2022-08-05 MED ORDER — ACETAMINOPHEN 325 MG PO TABS
ORAL_TABLET | ORAL | Status: AC
  Filled 2022-08-05: qty 3

## 2022-08-05 MED ORDER — ACETAMINOPHEN 325 MG PO TABS
975.0000 mg | ORAL_TABLET | Freq: Once | ORAL | Status: AC
  Administered 2022-08-05: 975 mg via ORAL

## 2022-08-05 MED ORDER — ACETAMINOPHEN 500 MG PO TABS: 1000.0000 mg | ORAL_TABLET | Freq: Once | ORAL | Status: DC

## 2022-08-05 NOTE — ED Provider Notes (Signed)
[facility]    CSN: [id number] Arrival date & time: 08/05/22  1613      History   Chief Complaint Chief Complaint  Patient presents with   Headache   Fatigue   Sore Throat   Otalgia   Cough   Nasal Congestion    HPI Jasmin Ellis is a 31 y.o. female.   Patient presents with headache, fatigue, bilateral eye pressure/discomfort, decreased appetite, sore throat, nausea without vomiting, nasal congestion, cough, runny nose that has been present since yesterday.  Denies any known sick contacts.  Patient reports that she has felt feverish but has not taken temperature with thermometer.  Denies chest pain, shortness of breath, vomiting, diarrhea, abdominal pain.  Patient has taken Tylenol earlier this morning at approximately 9 AM with minimal improvement.   Headache Sore Throat  Otalgia Cough   Past Medical History:  Diagnosis Date   Anemia    Anxiety    Back pain, chronic    s/p fall 8 years ago   Diabetes mellitus in pregnancy in third trimester 08/05/2014   Current Diabetic Medications:  Insulin  '[x]'$  Aspirin 81 mg daily after 12 weeks (? A2/B GDM)  Required Referrals for A1GDM or A2GDM: '[ ]'$  Diabetes Education and Testing Supplies '[ ]'$  Nutrition Cousult  For A2/B GDM or higher classes of DM '[ ]'$  Diabetes Education and Testing Supplies '[ ]'$  Nutrition Counsult '[ ]'$  Fetal ECHO after 20 weeks  '[ ]'$  Eye exam for retina evaluation   Baseline and surveillance lab   Diabetes mellitus without complication ([place])    Fibroids    GBS (group B Streptococcus carrier), +RV culture, currently pregnant 12/09/2019   Obesity in pregnancy 07/03/2019    Patient Active Problem List   Diagnosis Date Noted   Medication refill 09/27/2021   Amenorrhea 10/19/2020   IUD (intrauterine device) in place 12/26/2019   Gestational hypertension 12/26/2019   Type 2 diabetes mellitus without complications ([place]) 53/29/9242   Insomnia 03/10/2018   Anxiety with depression 03/10/2018   Back pain,  chronic     Past Surgical History:  Procedure Laterality Date   NO PAST SURGERIES      OB History     Gravida  4   Para  4   Term  4   Preterm  0   AB  0   Living  4      SAB  0   IAB  0   Ectopic  0   Multiple  0   Live Births  4            Home Medications    Prior to Admission medications   Medication Sig Start Date End Date Taking? Authorizing Provider  acetaminophen (TYLENOL) 325 MG tablet Take 650 mg by mouth every 6 (six) hours as needed for mild pain or headache.    [provider]  calcium carbonate (TUMS - DOSED IN MG ELEMENTAL CALCIUM) 500 MG chewable tablet Chew 1 tablet (200 mg of elemental calcium total) by mouth daily. Patient not taking: Reported on 09/27/2021 02/26/20   Shirley, [place], DO  cetirizine (ZYRTEC) 10 MG tablet Take 1 tablet (10 mg total) by mouth daily. 06/28/20 07/28/20  Corena Herter, PA-C  citalopram (CELEXA) 20 MG tablet Take 1 tablet (20 mg total) by mouth daily. 03/03/20   Shirley, [place], DO  colchicine 0.6 MG tablet Take 1 tablet (0.6 mg total) by mouth daily. Patient not taking: Reported on 09/27/2021 08/02/21   Prosperi,  Christian H, PA-C  enalapril (VASOTEC) 10 MG tablet Take 1 tablet (10 mg total) by mouth daily. Patient not taking: Reported on 09/27/2021 01/29/20   Shirley, [place], DO  fluticasone [facility]) 50 MCG/ACT nasal spray Place 1 spray into both nostrils daily. Patient not taking: Reported on 09/27/2021 06/28/20   Corena Herter, PA-C  hydrochlorothiazide (HYDRODIURIL) 25 MG tablet Take 1 tablet (25 mg total) by mouth daily. Patient not taking: Reported on 09/27/2021 01/29/20   Shirley, [place], DO  hydrOXYzine (ATARAX) 10 MG tablet Take 1 tablet (10 mg total) by mouth 3 (three) times daily as needed for anxiety. 10/08/21   Sharion Settler, DO  ibuprofen (ADVIL) 600 MG tablet Take 1 tablet (600 mg total) by mouth every 6 (six) hours as needed. 07/25/22   Talbot Grumbling, FNP  metFORMIN  (GLUCOPHAGE) 500 MG tablet Take 1 tablet (500 mg total) by mouth 2 (two) times daily with a meal. 01/29/20   Enid Derry, [place], DO  ondansetron (ZOFRAN-ODT) 4 MG disintegrating tablet Take 1 tablet (4 mg total) by mouth every 8 (eight) hours as needed for nausea or vomiting. 07/25/22   Talbot Grumbling, FNP  pantoprazole (PROTONIX) 40 MG tablet Take 1 tablet (40 mg total) by mouth daily. Patient not taking: Reported on 09/27/2021 02/26/20   Shirley, [place], DO  Prenatal Vit-Fe Fumarate-FA (MULTIVITAMIN-PRENATAL) 27-0.8 MG TABS tablet Take 1 tablet by mouth daily at 12 noon. 07/03/19   Emily Filbert, MD  insulin NPH Human (HUMULIN N) 100 UNIT/ML injection Inject before breakfast (10 units) and before supper (8 units) Patient not taking: Reported on 01/01/2020 11/19/19 01/02/20  Aletha Halim, MD    Family History Family History  Problem Relation Age of Onset   Diabetes Father     Social History Social History   Tobacco Use   Smoking status: Never   Smokeless tobacco: Never  Vaping Use   Vaping Use: Never used  Substance Use Topics   Alcohol use: No   Drug use: No     Allergies   Patient has no known allergies.   Review of Systems Review of Systems Per HPI  Physical Exam Triage Vital Signs ED Triage Vitals  Enc Vitals Group     BP 08/05/22 1721 (!) 139/91     Pulse Rate 08/05/22 1721 (!) 106     Resp 08/05/22 1721 12     Temp 08/05/22 1721 100.1 F (37.8 C)     Temp Source 08/05/22 1721 Oral     SpO2 08/05/22 1721 98 %     Weight --      Height --      Head Circumference --      Peak Flow --      Pain Score 08/05/22 1718 10     Pain Loc --      Pain Edu? --      Excl. in [place]? --    No data found.  Updated Vital Signs BP (!) 139/91 (BP Location: Left Arm)   Pulse (!) 115   Temp (!) 103 F (39.4 C)   Resp 12   LMP 07/25/2022 (Exact Date)   SpO2 98%   Visual Acuity Right Eye Distance:   Left Eye Distance:   Bilateral Distance:    Right Eye Near:   Left  Eye Near:    Bilateral Near:     Physical Exam Constitutional:      General: She is not in acute distress.    Appearance: Normal appearance.  She is ill-appearing. She is not toxic-appearing or diaphoretic.  HENT:     Head: Normocephalic and atraumatic.     Right Ear: Tympanic membrane and ear canal normal.     Left Ear: Tympanic membrane and ear canal normal.     Nose: Congestion present.     Mouth/Throat:     Mouth: Mucous membranes are moist.     Pharynx: No posterior oropharyngeal erythema.  Eyes:     Extraocular Movements: Extraocular movements intact.     Conjunctiva/sclera: Conjunctivae normal.     Pupils: Pupils are equal, round, and reactive to light.  Cardiovascular:     Rate and Rhythm: Normal rate and regular rhythm.     Pulses: Normal pulses.     Heart sounds: Normal heart sounds.  Pulmonary:     Effort: Pulmonary effort is normal. No respiratory distress.     Breath sounds: Normal breath sounds. No stridor. No wheezing, rhonchi or rales.  Abdominal:     General: Abdomen is flat. Bowel sounds are normal.     Palpations: Abdomen is soft.  Musculoskeletal:        General: Normal range of motion.     Cervical back: Normal range of motion.  Skin:    General: Skin is warm and dry.  Neurological:     General: No focal deficit present.     Mental Status: She is alert and oriented to person, place, and time. Mental status is at baseline.     Cranial Nerves: Cranial nerves 2-12 are intact.     Sensory: Sensation is intact.     Motor: Motor function is intact.     Coordination: Coordination is intact.     Gait: Gait is intact.  Psychiatric:        Mood and Affect: Mood normal.        Behavior: Behavior normal.      UC Treatments / Results  Labs (all labs ordered are listed, but only abnormal results are displayed) Labs Reviewed  POC INFLUENZA A AND B ANTIGEN (URGENT CARE ONLY)    EKG   Radiology No results found.  Procedures Procedures (including  critical care time)  Medications Ordered in UC Medications  acetaminophen (TYLENOL) tablet 975 mg (975 mg Oral Given 08/05/22 1801)    Initial Impression / Assessment and Plan / UC Course  I have reviewed the triage vital signs and the nursing notes.  Pertinent labs & imaging results that were available during my care of the patient were reviewed by me and considered in my medical decision making (see chart for details).     Rapid flu was negative.  975 mg of Tylenol was administered to help alleviate fever and subsequently decrease heart rate.  although, fever increased to 103 and heart rate also subsequently increased.  Patient is very ill-appearing on exam and given vital signs are not responding to medication in urgent care, I do think ED evaluation is warranted.  Patient was advised to go to the ED for further evaluation and management and was agreeable with plan.  Vital signs and patient stable at discharge.  Agree with patient self transport to the hospital. Final Clinical Impressions(s) / UC Diagnoses   Final diagnoses:  Fever, unspecified  Viral illness     Discharge Instructions      Go to the emergency department as soon as you leave urgent care for further evaluation and management.     ED Prescriptions   None    PDMP  not reviewed this encounter.   Teodora Medici, [place] 08/05/22 1843

## 2022-08-05 NOTE — ED Notes (Signed)
Patient is being discharged from the Urgent Care and sent to the Emergency Department via walked . Per [facility], patient is in need of higher level of care due to higher level of care. Patient is aware and verbalizes understanding of plan of care.  Vitals:   08/05/22 1721 08/05/22 1839  BP: (!) 139/91   Pulse: (!) 106 (!) 115  Resp: 12   Temp: 100.1 F (37.8 C) (!) 103 F (39.4 C)  SpO2: 98%

## 2022-08-05 NOTE — ED Triage Notes (Signed)
Pt is here for severe headache, fatigue, bilateral eye pain, loss of appetite , sore throat , nausea, nasal congestion , runny nose ., pt feels faint x 2days

## 2022-08-05 NOTE — ED Provider Triage Note (Signed)
Emergency Medicine Provider Triage Evaluation Note  Jasmin Ellis , a 31 y.o. female  was evaluated in triage.  Pt complains of URI symptoms for the last 2 days.  Patient was seen in urgent care and redirected to the ED.  Patient complaining of dry mouth, bilateral eye discomfort, runny nose, congestion, sore throat, fever.  The patient denies any known sick contacts.  Patient denies any chest pain or shortness of breath.  Review of Systems  Positive:  Negative:   Physical Exam  BP (!) 179/95   Pulse (!) 102   Temp 100.3 F (37.9 C) (Oral)   Resp 18   LMP 07/25/2022 (Exact Date)   SpO2 100%  Gen:   Awake, no distress   Resp:  Normal effort  MSK:   Moves extremities without difficulty  Other:    Medical Decision Making  Medically screening exam initiated at 8:17 PM.  Appropriate orders placed.  Jasmin Ellis was informed that the remainder of the evaluation will be completed by another provider, this initial triage assessment does not replace that evaluation, and the importance of remaining in the ED until their evaluation is complete.     Azucena Cecil, PA-C 08/05/22 2019

## 2022-08-05 NOTE — Discharge Instructions (Signed)
Go to the emergency department as soon as you leave urgent care for further evaluation and management. 

## 2022-08-05 NOTE — ED Triage Notes (Signed)
Pt c/o fever, headache, nasal congestion and dizziness x 1 day.

## 2022-08-06 NOTE — ED Notes (Signed)
This NT and Rachel(NT) checked the whole lobby vital signs and checked outside. No response.

## 2022-08-10 LAB — CULTURE, BLOOD (ROUTINE X 2)
Culture: NO GROWTH
Culture: NO GROWTH
Special Requests: ADEQUATE
Special Requests: ADEQUATE

## 2022-09-27 NOTE — Progress Notes (Signed)
SUBJECTIVE:   CHIEF COMPLAINT / HPI:   Jasmin Ellis is a 31 y.o. [id number] here for Paragard IUD removal. No GYN concerns.  Last pap smear was on 06/11/2019 and was normal.  Discuss IUD removal Reports that she spots every month, lasts 10-15 days. She had her Paragard inserted on 10/08/21. She does not desire any other LARC. She would like to use condoms for birth control. She does not desire any more children but also does not want any permanent form of sterilization.   She is sexually active with her husband. She declines STI testing today.  Last Pap 05/2019 was normal, HPV negative. Next Pap due 05/2024.  PERTINENT  PMH / PSH: Anxiety with depression   OBJECTIVE:   BP (!) 142/100   Pulse 84   Ht '5\' 4"'$  (1.626 m)   Wt 183 lb (83 kg)   LMP 09/23/2022   SpO2 100%   BMI 31.41 kg/m   Vitals:   09/28/22 1047 09/28/22 1109  BP: (!) 154/97 (!) 142/100  Pulse: 84   SpO2: 100%    General: NAD, pleasant, able to participate in exam Respiratory: normal effort GU: Normal appearance of labia majora and minora, without lesions. Vagina tissue pink, moist, without lesions or abrasions. Cervix normal appearance, non-friable, without discharge from os. IUD strings appreciated at os   Psych: Normal affect and mood  GU exam chaperoned by Lenise Herald, CMA      09/28/2022   10:50 AM 10/19/2020   11:31 AM 02/26/2020    9:45 AM  Depression screen PHQ 2/9  Decreased Interest 1 1 0  Down, Depressed, Hopeless 1 1 0  PHQ - 2 Score 2 2 0  Altered sleeping 1 1   Tired, decreased energy 1 1   Change in appetite 3 3   Feeling bad or failure about yourself  0 0   Trouble concentrating 1 1   Moving slowly or fidgety/restless  1   Suicidal thoughts 0 0   PHQ-9 Score 8 9     ASSESSMENT/PLAN:   Encounter for IUD removal Patient identified, informed consent performed, consent signed.  Patient was in the dorsal lithotomy position, normal external genitalia was noted.  A speculum was  placed in the patient's vagina, normal discharge was noted, no lesions. The cervix was visualized, no lesions, no abnormal discharge.  The strings of the IUD were grasped and pulled using ring forceps. The IUD was removed in its entirety. Patient tolerated the procedure well.    Patient will use condoms for contraception- she was provided with some in the office. We did discuss other forms of contraception including OCP, patches, Nexplanon, depo, permanent sterilization but she is not interested at this time.   Hypertension She has been off of her medication for over a year at this point. She is asymptomatic but we discussed the dangers of elevated BP over time. Reviewed her previous elevated BP's with her. Offered her option of 24 hour ambulatory monitoring vs restarting medications, she ultimately agreed to start medication. -Start amlodipine-olmesartan 5-20 mg daily (discussed not safe in pregnancy) -Return in 2 weeks for BP check and BMP  Anxiety with depression She is unsure which anti-depressant medication she is taking. On her medication list she has hydroxizine and celexa but she does not think this is the correct medication. Encouraged her to bring her medications with her to her next visit. She does feel stress given that her husband travels frequently for work. She is not  interested in therapy options today. Discussed that I would be happy to discuss other medications options at another time. She did mention that she was taking an anti-depressant medication "as needed", encouraged her to take daily as most anti-depressants work best that way (though not 100% sure which medication she was referring to since she did not bring medications with her). Denies SI/HI.   Sharion Settler, [place]

## 2022-09-28 ENCOUNTER — Encounter: Payer: Self-pay | Admitting: Family Medicine

## 2022-09-28 ENCOUNTER — Ambulatory Visit (INDEPENDENT_AMBULATORY_CARE_PROVIDER_SITE_OTHER): Payer: Commercial Managed Care - HMO | Admitting: Family Medicine

## 2022-09-28 VITALS — BP 142/100 | HR 84 | Ht 64.0 in | Wt 183.0 lb

## 2022-09-28 DIAGNOSIS — F418 Other specified anxiety disorders: Secondary | ICD-10-CM

## 2022-09-28 DIAGNOSIS — I10 Essential (primary) hypertension: Secondary | ICD-10-CM | POA: Diagnosis not present

## 2022-09-28 DIAGNOSIS — Z30432 Encounter for removal of intrauterine contraceptive device: Secondary | ICD-10-CM

## 2022-09-28 MED ORDER — AMLODIPINE-OLMESARTAN 5-20 MG PO TABS: 1.0000 | ORAL_TABLET | Freq: Every day | ORAL | 2 refills | Status: DC

## 2022-09-28 NOTE — Assessment & Plan Note (Addendum)
Patient identified, informed consent performed, consent signed.  Patient was in the dorsal lithotomy position, normal external genitalia was noted.  A speculum was placed in the patient's vagina, normal discharge was noted, no lesions. The cervix was visualized, no lesions, no abnormal discharge.  The strings of the IUD were grasped and pulled using ring forceps. The IUD was removed in its entirety. Patient tolerated the procedure well.    Patient will use condoms for contraception- she was provided with some in the office. We did discuss other forms of contraception including OCP, patches, Nexplanon, depo, permanent sterilization but she is not interested at this time.

## 2022-09-28 NOTE — Patient Instructions (Signed)
It was wonderful to see you today.  Please bring ALL of your medications with you to every visit.   Today we talked about:  From starting you on a medication for blood pressure.  Please schedule an appointment in 2 weeks for a blood pressure recheck and for some blood work.  Your IUD was removed today.  You were provided with some condoms.  Please come back if you would like to start another form of birth control.  Please keep in mind that the blood pressure medication is not safe in pregnancy.  To find a therapist visit [email or web address]. At this website you will be able to filter which providers take your click your insurance as well as other filters to fit your needs. For medicine management please call your insurance to find psychiatrist in network   Thank you for coming to your visit as scheduled. We have had a large "no-show" problem lately, and this significantly limits our ability to see and care for patients. As a friendly reminder- if you cannot make your appointment please call to cancel. We do have a no show policy for those who do not cancel within 24 hours. Our policy is that if you miss or fail to cancel an appointment within 24 hours, 3 times in a 69-monthperiod, you may be dismissed from our clinic.   Thank you for choosing [facility]   Please call [phone] any questions about today's appointment.  Please be sure to schedule follow up at the front  desk before you leave today.   ASharion Settler DO PGY-3 Family Medicine

## 2022-09-28 NOTE — Assessment & Plan Note (Addendum)
She is unsure which anti-depressant medication she is taking. On her medication list she has hydroxizine and celexa but she does not think this is the correct medication. Encouraged her to bring her medications with her to her next visit. She does feel stress given that her husband travels frequently for work. She is not interested in therapy options today. Discussed that I would be happy to discuss other medications options at another time. She did mention that she was taking an anti-depressant medication "as needed", encouraged her to take daily as most anti-depressants work best that way (though not 100% sure which medication she was referring to since she did not bring medications with her). Denies SI/HI.

## 2022-09-28 NOTE — Assessment & Plan Note (Addendum)
She has been off of her medication for over a year at this point. She is asymptomatic but we discussed the dangers of elevated BP over time. Reviewed her previous elevated BP's with her. Offered her option of 24 hour ambulatory monitoring vs restarting medications, she ultimately agreed to start medication. -Start amlodipine-olmesartan 5-20 mg daily (discussed not safe in pregnancy) -Return in 2 weeks for BP check and BMP

## 2022-10-18 NOTE — Progress Notes (Signed)
    SUBJECTIVE:   CHIEF COMPLAINT / HPI:   Kaori Hopple is a 32 y.o. female who presents to the [facility] clinic today to discuss the following concerns:   Hypertension F/U Last seen on 12/13, her blood pressure was elevated at that time.  She has history of hypertension and previously on medications but has been off of them for a year.  At last visit she was started on amlodipine olmesartan 5-20 mg daily. She returns today for follow up and BMP. Reports good*** compliance. Home blood pressure readings range *** systolic and *** diastolic. Denies chest pain, shortness of breath, lower extremity edema, headaches, and vision changes.   Hx Gestational Diabetes BMP in October showed glucose of 287.  Last A1c appears to be in April 2021. Will check A1c.   PERTINENT  PMH / PSH: ***  OBJECTIVE:   LMP 09/23/2022  ***  General: NAD, pleasant, able to participate in exam Cardiac: RRR, no murmurs. Respiratory: CTAB, normal effort, No wheezes, rales or rhonchi Abdomen: Bowel sounds present, nontender, nondistended, no hepatosplenomegaly. Extremities: no edema or cyanosis. Skin: warm and dry, no rashes noted Neuro: alert, no obvious focal deficits Psych: Normal affect and mood  ASSESSMENT/PLAN:   No problem-specific Assessment & Plan notes found for this encounter.     Sharion Settler, [place]

## 2022-10-19 ENCOUNTER — Encounter: Payer: Self-pay | Admitting: Family Medicine

## 2022-10-19 ENCOUNTER — Ambulatory Visit (INDEPENDENT_AMBULATORY_CARE_PROVIDER_SITE_OTHER): Payer: Commercial Managed Care - HMO | Admitting: Family Medicine

## 2022-10-19 VITALS — BP 135/82 | HR 89 | Ht 64.0 in | Wt 178.4 lb

## 2022-10-19 DIAGNOSIS — R1011 Right upper quadrant pain: Secondary | ICD-10-CM

## 2022-10-19 DIAGNOSIS — M545 Low back pain, unspecified: Secondary | ICD-10-CM

## 2022-10-19 DIAGNOSIS — I10 Essential (primary) hypertension: Secondary | ICD-10-CM | POA: Diagnosis not present

## 2022-10-19 DIAGNOSIS — R739 Hyperglycemia, unspecified: Secondary | ICD-10-CM | POA: Diagnosis not present

## 2022-10-19 DIAGNOSIS — E119 Type 2 diabetes mellitus without complications: Secondary | ICD-10-CM

## 2022-10-19 DIAGNOSIS — Z13220 Encounter for screening for lipoid disorders: Secondary | ICD-10-CM

## 2022-10-19 DIAGNOSIS — F32A Depression, unspecified: Secondary | ICD-10-CM | POA: Insufficient documentation

## 2022-10-19 DIAGNOSIS — G8929 Other chronic pain: Secondary | ICD-10-CM

## 2022-10-19 LAB — POCT GLYCOSYLATED HEMOGLOBIN (HGB A1C): HbA1c, POC (controlled diabetic range): 8.3 % — AB (ref 0.0–7.0)

## 2022-10-19 MED ORDER — ROSUVASTATIN CALCIUM 20 MG PO TABS: 20.0000 mg | ORAL_TABLET | Freq: Every day | ORAL | 3 refills | Status: DC

## 2022-10-19 MED ORDER — SERTRALINE HCL 50 MG PO TABS: 50.0000 mg | ORAL_TABLET | Freq: Every day | ORAL | 2 refills | Status: DC

## 2022-10-19 MED ORDER — METFORMIN HCL ER 500 MG PO TB24: 500.0000 mg | ORAL_TABLET | Freq: Every day | ORAL | 3 refills | Status: DC

## 2022-10-19 NOTE — Assessment & Plan Note (Signed)
Elevated PHQ-9 score of 12. No SI/HI. No concern for domestic abuse at this time. Encouraged both medication and therapy for her, she is still hesitant about going to therapy but I provided information on her AVS should she change her mind. Prescribed Celexa in the past but she does not recall taking this. Will trial different SSRI. Verbally screened out bipolar, she denies any manic type symptoms including impulsivity, going days without sleep, high sex drive. No Fhx of bipolar.  -F/u scheduled in 2 weeks with me  -Start Sertraline 25 mg x1 week then increase to 50 mg. If tolerating can go up at next visit per patient preference

## 2022-10-19 NOTE — Assessment & Plan Note (Signed)
BP improved today. She is tolerating medication well. Home readings seem appropriate.  -Continue Amlodipine-Olmesartan 5-20 mg daily  -CMP today

## 2022-10-19 NOTE — Assessment & Plan Note (Addendum)
Chronic ongoing back pain. She mentioned it was right-sided and felt different than previous back pain but on examination she only has ttp with extension and has some ttp with palpation of midline lumbar spine. This is consistent with area of pain dating back many years. Differentials include DDD, disc herniation. No symptoms of sciatica. No red flags. -Given radiation of sx to RUQ and pt concern for liver dysfunction will check CMP, despite benign abdominal exam  -Conservative management -Referral to PT for exercises -Heat, voltaren gel PRN  -Encouraged to avoid oral NSAIDs while taking SSRI  -"Back book" provided to patient  -Consider imaging of lumbar spine if pain worsens or does not improve with conservative therapies

## 2022-10-19 NOTE — Patient Instructions (Addendum)
It was wonderful to see you today.  Please bring ALL of your medications with you to every visit.   Today we talked about:  For your back I would encourage exercises. You can use heating pads and buy over the counter Voltaren gel to help with your pain.  We are doing lab work today to check your kidneys, electrolytes, and liver function. I will send you a MyChart message if you have MyChart. Otherwise, I will give you a call for abnormal results or send a letter if everything returned back normal. If you don't hear from me in 2 weeks, please call the office.    Your A1c (screening for diabetes) was elevated. You are back in the diabetes range. I am sending you Metformin to take. I am also sending a medication for your cholesterol, take it once a day. See below for diet recommendations for your diabetes.  I am starting you on a new medication called Sertraline for depression. Take half a tablet for one week and then increase to 1 tablet daily.  Follow up with me in 2 weeks to see how you are doing. You can check the website below for therapy resources.   To find a therapist visit [email or web address]ee. At this website you will be able to filter which providers take your click your insurance as well as other filters to fit your needs. For medicine management please call your insurance to find psychiatrist in network    Diet Recommendations for Diabetes  Carbohydrate includes starch, sugar, and fiber.  Of these, only sugar and starch raise blood glucose.  (Fiber is found in fruits, vegetables [especially skin, seeds, and stalks], whole grains, and beans.)   Starchy (carb) foods: Bread, rice, pasta, potatoes, corn, cereal, grits, crackers, bagels, muffins, all baked goods.  (Fruit, milk, and yogurt also have carbohydrate, but most of these foods will not spike your blood sugar as most starchy or sweet foods will.)  A few fruits do cause high blood sugars; use small portions of bananas (limit to 1/2  at a time), grapes, watermelon, and oranges.   Protein foods: Meat, fish, poultry, eggs, dairy foods, and beans such as pinto and kidney beans (beans also provide carbohydrate).   1. Eat at least 3 REAL meals and 1-2 snacks per day.  Eat breakfast within one hour of getting up.  Have something to eat at least every 5 hours while awake.  - A REAL meal for lunch or dinner includes at least some protein, some starch, and vegetables and/or fruit.   - A REAL breakfast needs to include both starch and protein foods.     2. Limit starchy foods to TWO portions per meal and ONE per snack. ONE portion of a starchy food is equal to the following:  - ONE slice of bread (or its equivalent, such as half of a hamburger bun).  - 1/2 cup of a "scoopable" starchy food such as potatoes or rice.  - 15 grams of Total Carbohydrate as shown on food label.  - 4 ounces of a sweet drink (including fruit juice).  3. Include twice the volume of vegetables as protein or carbohydrate foods in at least 3 meals per week.  - Fresh or frozen vegetables are best.  - Keep frozen vegetables on hand for a quick option.         Thank you for coming to your visit as scheduled. We have had a large "no-show" problem lately, and this significantly limits our ability  to see and care for patients. As a friendly reminder- if you cannot make your appointment please call to cancel. We do have a no show policy for those who do not cancel within 24 hours. Our policy is that if you miss or fail to cancel an appointment within 24 hours, 3 times in a 40-monthperiod, you may be dismissed from our clinic.   Thank you for choosing [facility]   Please call [phone] any questions about today's appointment.  Please be sure to schedule follow up at the front  desk before you leave today.   ASharion Settler DO PGY-3 Family Medicine

## 2022-10-19 NOTE — Assessment & Plan Note (Signed)
History of gestational diabetes in 2020 requiring insulin. Her A1c today is 8.2. We discussed that this is in the diabetes range. Recommended starting Metformin, she has been on this in the past and tolerated it.  -Metformin XR 500 mg daily, increase to 500 mg BID in one week -She is returning for f/u in 2 weeks to discuss depression but can continue titrating up Metformin at that time if she is tolerating well -Diet recommendations provided on AVS -Check lipids  -Starting Rosuvastatin 20 mg, can increase to high intensity pending lipid panel

## 2022-10-20 ENCOUNTER — Other Ambulatory Visit: Payer: Self-pay

## 2022-10-20 ENCOUNTER — Emergency Department (HOSPITAL_COMMUNITY)
Admission: EM | Admit: 2022-10-20 | Discharge: 2022-10-21 | Discharge status: Against Medical Advice | Payer: Commercial Managed Care - HMO | Attending: Emergency Medicine | Admitting: Emergency Medicine

## 2022-10-20 DIAGNOSIS — Z743 Need for continuous supervision: Secondary | ICD-10-CM | POA: Diagnosis not present

## 2022-10-20 DIAGNOSIS — R739 Hyperglycemia, unspecified: Secondary | ICD-10-CM | POA: Insufficient documentation

## 2022-10-20 DIAGNOSIS — R002 Palpitations: Secondary | ICD-10-CM | POA: Insufficient documentation

## 2022-10-20 DIAGNOSIS — R350 Frequency of micturition: Secondary | ICD-10-CM | POA: Insufficient documentation

## 2022-10-20 DIAGNOSIS — I10 Essential (primary) hypertension: Secondary | ICD-10-CM | POA: Diagnosis not present

## 2022-10-20 DIAGNOSIS — Z5321 Procedure and treatment not carried out due to patient leaving prior to being seen by health care provider: Secondary | ICD-10-CM | POA: Diagnosis not present

## 2022-10-20 LAB — CBC WITH DIFFERENTIAL/PLATELET
Abs Immature Granulocytes: 0.02 10*3/uL (ref 0.00–0.07)
Basophils Absolute: 0 10*3/uL (ref 0.0–0.1)
Basophils Relative: 1 %
Eosinophils Absolute: 0.4 10*3/uL (ref 0.0–0.5)
Eosinophils Relative: 4 %
HCT: 34.8 % — ABNORMAL LOW (ref 36.0–46.0)
Hemoglobin: 11.3 g/dL — ABNORMAL LOW (ref 12.0–15.0)
Immature Granulocytes: 0 %
Lymphocytes Relative: 42 %
Lymphs Abs: 3.4 10*3/uL (ref 0.7–4.0)
MCH: 27.3 pg (ref 26.0–34.0)
MCHC: 32.5 g/dL (ref 30.0–36.0)
MCV: 84.1 fL (ref 80.0–100.0)
Monocytes Absolute: 0.4 10*3/uL (ref 0.1–1.0)
Monocytes Relative: 6 %
Neutro Abs: 3.8 10*3/uL (ref 1.7–7.7)
Neutrophils Relative %: 47 %
Platelets: 309 10*3/uL (ref 150–400)
RBC: 4.14 MIL/uL (ref 3.87–5.11)
RDW: 14 % (ref 11.5–15.5)
WBC: 8 10*3/uL (ref 4.0–10.5)
nRBC: 0 % (ref 0.0–0.2)

## 2022-10-20 LAB — LIPID PANEL
Chol/HDL Ratio: 2.5 ratio (ref 0.0–4.4)
Cholesterol, Total: 124 mg/dL (ref 100–199)
HDL: 49 mg/dL (ref >39)
LDL Chol Calc (NIH): 66 mg/dL (ref 0–99)
Triglycerides: 35 mg/dL (ref 0–149)
VLDL Cholesterol Cal: 9 mg/dL (ref 5–40)

## 2022-10-20 LAB — URINALYSIS, ROUTINE W REFLEX MICROSCOPIC
Bilirubin Urine: NEGATIVE
Glucose, UA: 150 mg/dL — AB
Ketones, ur: NEGATIVE mg/dL
Nitrite: NEGATIVE
Protein, ur: NEGATIVE mg/dL
Specific Gravity, Urine: 1.006 (ref 1.005–1.030)
pH: 7 (ref 5.0–8.0)

## 2022-10-20 LAB — BASIC METABOLIC PANEL
Anion gap: 9 (ref 5–15)
BUN: 14 mg/dL (ref 6–20)
CO2: 22 mmol/L (ref 22–32)
Calcium: 9.2 mg/dL (ref 8.9–10.3)
Chloride: 101 mmol/L (ref 98–111)
Creatinine, Ser: 0.7 mg/dL (ref 0.44–1.00)
GFR, Estimated: >60 mL/min (ref >60)
Glucose, Bld: 213 mg/dL — ABNORMAL HIGH (ref 70–99)
Potassium: 3.5 mmol/L (ref 3.5–5.1)
Sodium: 132 mmol/L — ABNORMAL LOW (ref 135–145)

## 2022-10-20 LAB — COMPREHENSIVE METABOLIC PANEL
ALT: 9 IU/L (ref 0–32)
AST: 11 IU/L (ref 0–40)
Albumin/Globulin Ratio: 1.5 (ref 1.2–2.2)
Albumin: 4.6 g/dL (ref 3.9–4.9)
Alkaline Phosphatase: 49 IU/L (ref 44–121)
BUN/Creatinine Ratio: 9 (ref 9–23)
BUN: 5 mg/dL — ABNORMAL LOW (ref 6–20)
Bilirubin Total: 0.3 mg/dL (ref 0.0–1.2)
CO2: 23 mmol/L (ref 20–29)
Calcium: 10.2 mg/dL (ref 8.7–10.2)
Chloride: 101 mmol/L (ref 96–106)
Creatinine, Ser: 0.56 mg/dL — ABNORMAL LOW (ref 0.57–1.00)
Globulin, Total: 3 g/dL (ref 1.5–4.5)
Glucose: 159 mg/dL — ABNORMAL HIGH (ref 70–99)
Potassium: 4.5 mmol/L (ref 3.5–5.2)
Sodium: 138 mmol/L (ref 134–144)
Total Protein: 7.6 g/dL (ref 6.0–8.5)
eGFR: 124 mL/min/{1.73_m2} (ref >59)

## 2022-10-20 LAB — TROPONIN I (HIGH SENSITIVITY): Troponin I (High Sensitivity): <2 ng/L

## 2022-10-20 LAB — I-STAT BETA HCG BLOOD, ED (MC, WL, AP ONLY): I-stat hCG, quantitative: <5 m[IU]/mL (ref <5)

## 2022-10-20 NOTE — ED Provider Triage Note (Signed)
Emergency Medicine Provider Triage Evaluation Note  Jasmin Ellis , a 32 y.o. female  was evaluated in triage.  Pt complains of HTN and hyperglycemia.  CBG 250 with EMS.  Was previously on metformin but doctor took her off of it as felt it was no longer needed.  Has had urinary frequency.  BP 160/80.  Reports palpitations but denies chest pain or SOB.  Review of Systems  Positive: Hyperglycemia, HTN Negative: fever  Physical Exam  BP 133/82 (BP Location: Right Arm)   Pulse 81   Temp 98.2 F (36.8 C) (Oral)   Resp 19   LMP 09/23/2022   SpO2 100%   Gen:   Awake, no distress   Resp:  Normal effort  MSK:   Moves extremities without difficulty  Other:    Medical Decision Making  Medically screening exam initiated at 10:26 PM.  Appropriate orders placed.  Jasmin Ellis was informed that the remainder of the evaluation will be completed by another provider, this initial triage assessment does not replace that evaluation, and the importance of remaining in the ED until their evaluation is complete.  Hyperglycemia, HTN.  Labs, UA ordered.   [name], PA-C 10/21/22 [phone]

## 2022-10-20 NOTE — ED Triage Notes (Signed)
Patient arrived with EMS from home reports elevated blood sugar this evening CBG= 250 and hypertensive BP= 160/80 with intermittent palpitations .

## 2022-10-21 DIAGNOSIS — I10 Essential (primary) hypertension: Secondary | ICD-10-CM | POA: Diagnosis not present

## 2022-10-21 LAB — CBG MONITORING, ED: Glucose-Capillary: 196 mg/dL — ABNORMAL HIGH (ref 70–99)

## 2022-10-21 LAB — TROPONIN I (HIGH SENSITIVITY): Troponin I (High Sensitivity): <2 ng/L (ref <18)

## 2022-10-21 NOTE — ED Notes (Signed)
Pt stated that she had to get home to her kids. Pt seen leaving the ED.

## 2022-10-24 ENCOUNTER — Ambulatory Visit: Payer: Commercial Managed Care - HMO | Admitting: Pharmacist

## 2022-11-03 ENCOUNTER — Ambulatory Visit: Payer: Self-pay | Admitting: Family Medicine

## 2023-05-01 ENCOUNTER — Ambulatory Visit (HOSPITAL_COMMUNITY)
Admission: EM | Admit: 2023-05-01 | Discharge: 2023-05-01 | Disposition: A | Discharge status: Home / Self Care | Payer: Medicaid Other | Attending: Internal Medicine | Admitting: Internal Medicine

## 2023-05-01 ENCOUNTER — Other Ambulatory Visit: Payer: Self-pay

## 2023-05-01 ENCOUNTER — Encounter (HOSPITAL_COMMUNITY): Payer: Self-pay

## 2023-05-01 DIAGNOSIS — E119 Type 2 diabetes mellitus without complications: Secondary | ICD-10-CM | POA: Insufficient documentation

## 2023-05-01 DIAGNOSIS — R0789 Other chest pain: Secondary | ICD-10-CM | POA: Diagnosis not present

## 2023-05-01 DIAGNOSIS — Z1152 Encounter for screening for COVID-19: Secondary | ICD-10-CM | POA: Diagnosis not present

## 2023-05-01 DIAGNOSIS — R519 Headache, unspecified: Secondary | ICD-10-CM | POA: Insufficient documentation

## 2023-05-01 DIAGNOSIS — R42 Dizziness and giddiness: Secondary | ICD-10-CM | POA: Diagnosis not present

## 2023-05-01 DIAGNOSIS — J069 Acute upper respiratory infection, unspecified: Secondary | ICD-10-CM | POA: Diagnosis not present

## 2023-05-01 DIAGNOSIS — I10 Essential (primary) hypertension: Secondary | ICD-10-CM | POA: Insufficient documentation

## 2023-05-01 LAB — POCT FASTING CBG KUC MANUAL ENTRY: POCT Glucose (KUC): 96 mg/dL (ref 70–99)

## 2023-05-01 MED ORDER — BENZONATATE 100 MG PO CAPS: 100.0000 mg | ORAL_CAPSULE | Freq: Three times a day (TID) | ORAL | 0 refills | Status: DC

## 2023-05-01 MED ORDER — KETOROLAC TROMETHAMINE 30 MG/ML IJ SOLN
30.0000 mg | Freq: Once | INTRAMUSCULAR | Status: AC
  Administered 2023-05-01: 30 mg via INTRAMUSCULAR

## 2023-05-01 MED ORDER — KETOROLAC TROMETHAMINE 30 MG/ML IJ SOLN
INTRAMUSCULAR | Status: AC
  Filled 2023-05-01: qty 1

## 2023-05-01 NOTE — ED Triage Notes (Signed)
Pt c/o headaches, dizziness, and center chest pressure x3 days. States hasn't had her blood pressure meds in a very long time, trying to control with diet.

## 2023-05-01 NOTE — ED Provider Notes (Addendum)
[facility]    CSN: [id number] Arrival date & time: 05/01/23  1018      History   Chief Complaint Chief Complaint  Patient presents with   Hypertension    HPI Jasmin Ellis is a 32 y.o. female.   Patient presents to urgent care for evaluation of dizziness, fatigue, headache, central chest pressure, and runny nose that started 3 days ago. Chest discomfort is a stabbing pain and 9/10 currently. Pain does not travel.  "Breathing and relaxing" makes pain to the chest better, unsure of triggering factors. Cough sometimes may make pain a little worse. Headache is generalized and currently 10/10 and she is having a difficult time describing pain. Dizziness comes and goes and is worsened by position changes from sitting to standing. No nausea, vomiting, abdominal pain, vision changes, fever/chills, urinary symptoms, syncope, neck pain, shortness of breath, or heart palpitations. She is currently on her menstrual cycle and states amount of bleeding has been normal for her. 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 She is a type 2 diabetic but has not checked her blood sugar recently.  She has a history of HTN and type 2 diabetes and states she stopped taking her metformin/amlodipine medication approximately 6 months ago.  States she has been fasting and going long periods of time without eating in order to lose weight.  Recently lost approximately 10 pounds over the last 6 months with trying.  Eating makes dizziness and headache better.  She has not tempted use of any over-the-counter medications to help with symptoms and has not eaten this morning, currently fasting.   Hypertension    Past Medical History:  Diagnosis Date   Anemia    Anxiety    Back pain, chronic    s/p fall 8 years ago   Diabetes mellitus in pregnancy in third trimester 08/05/2014   Current Diabetic Medications:  Insulin  [x]  Aspirin 81 mg  daily after 12 weeks (? A2/B GDM)  Required Referrals for A1GDM or A2GDM: [ ]  Diabetes Education and Testing Supplies [ ]  Nutrition Cousult  For A2/B GDM or higher classes of DM [ ]  Diabetes Education and Testing Supplies [ ]  Nutrition Counsult [ ]  Fetal ECHO after 20 weeks  [ ]  Eye exam for retina evaluation   Baseline and surveillance lab   Diabetes mellitus without complication (HCC)    Fibroids    GBS (group B Streptococcus carrier), +RV culture, currently pregnant 12/09/2019   Obesity in pregnancy 07/03/2019    Patient Active Problem List   Diagnosis Date Noted   Depression 10/19/2022   Hypertension 09/28/2022   Medication refill 09/27/2021   Amenorrhea 10/19/2020   Type 2 diabetes mellitus without complications (HCC) 12/25/2019   Encounter for IUD removal 06/07/2019   Insomnia 03/10/2018   Anxiety with depression 03/10/2018   Back pain, chronic     Past Surgical History:  Procedure Laterality Date   NO PAST SURGERIES      OB History     Gravida  4   Para  4   Term  4   Preterm  0   AB  0   Living  4      SAB  0   IAB  0   Ectopic  0   Multiple  0   Live Births  4            Home Medications  Prior to Admission medications   Medication Sig Start Date End Date Taking? Authorizing Provider  benzonatate (TESSALON) 100 MG capsule Take 1 capsule (100 mg total) by mouth every 8 (eight) hours. 05/01/23  Yes Carlisle Beers, FNP  rosuvastatin (CRESTOR) 20 MG tablet Take 1 tablet (20 mg total) by mouth daily. 10/19/22   Sabino Dick, DO  sertraline (ZOLOFT) 50 MG tablet Take 1 tablet (50 mg total) by mouth daily. 10/19/22   Sabino Dick, DO  insulin NPH Human (HUMULIN N) 100 UNIT/ML injection Inject before breakfast (10 units) and before supper (8 units) Patient not taking: Reported on 01/01/2020 11/19/19 01/02/20  [place] Bing, MD    Family History Family History  Problem Relation Age of Onset   Diabetes Father     Social  History Social History   Tobacco Use   Smoking status: Never   Smokeless tobacco: Never  Vaping Use   Vaping status: Never Used  Substance Use Topics   Alcohol use: No   Drug use: No     Allergies   Patient has no known allergies.   Review of Systems Review of Systems Per HPI  Physical Exam Triage Vital Signs ED Triage Vitals [05/01/23 1139]  Encounter Vitals Group     BP (!) 156/104     Systolic BP Percentile      Diastolic BP Percentile      Pulse Rate 78     Resp 18     Temp 97.9 F (36.6 C)     Temp Source Oral     SpO2 98 %     Weight      Height      Head Circumference      Peak Flow      Pain Score 9     Pain Loc      Pain Education      Exclude from Growth Chart    No data found.  Updated Vital Signs BP (!) 156/104 (BP Location: Right Arm)   Pulse 78   Temp 97.9 F (36.6 C) (Oral)   Resp 18   Wt 167 lb 3.2 oz (75.8 kg)   LMP 04/27/2023   SpO2 98%   BMI 28.70 kg/m   Visual Acuity Right Eye Distance:   Left Eye Distance:   Bilateral Distance:    Right Eye Near:   Left Eye Near:    Bilateral Near:     Physical Exam Vitals and nursing note reviewed.  Constitutional:      Appearance: She is not ill-appearing or toxic-appearing.  HENT:     Head: Normocephalic and atraumatic.     Right Ear: Hearing, tympanic membrane, ear canal and external ear normal.     Left Ear: Hearing, tympanic membrane, ear canal and external ear normal.     Nose: Nose normal.     Mouth/Throat:     Lips: Pink.     Mouth: Mucous membranes are moist. No injury.     Tongue: No lesions. Tongue does not deviate from midline.     Palate: No mass and lesions.     Pharynx: Oropharynx is clear. Uvula midline. No pharyngeal swelling, oropharyngeal exudate, posterior oropharyngeal erythema or uvula swelling.     Tonsils: No tonsillar exudate or tonsillar abscesses.  Eyes:     General: Lids are normal. Vision grossly intact. Gaze aligned appropriately.     Extraocular  Movements: Extraocular movements intact.     Conjunctiva/sclera: Conjunctivae normal.  Cardiovascular:  Rate and Rhythm: Normal rate and regular rhythm.     Heart sounds: Normal heart sounds, S1 normal and S2 normal.  Pulmonary:     Effort: Pulmonary effort is normal. No respiratory distress.     Breath sounds: Normal breath sounds and air entry. No wheezing, rhonchi or rales.  Chest:     Chest wall: Tenderness (Tenderness elicited to palpation at the substernal central chest.) present.  Musculoskeletal:     Cervical back: Neck supple.  Skin:    General: Skin is warm and dry.     Capillary Refill: Capillary refill takes less than 2 seconds.     Findings: No rash.  Neurological:     General: No focal deficit present.     Mental Status: She is alert and oriented to person, place, and time. Mental status is at baseline.     Cranial Nerves: Cranial nerves 2-12 are intact. No dysarthria or facial asymmetry.     Sensory: Sensation is intact.     Motor: Motor function is intact. No weakness, abnormal muscle tone or pronator drift.     Coordination: Coordination is intact. Romberg sign negative. Finger-Nose-Finger Test normal.     Gait: Gait is intact.     Comments: Strength and sensation intact to bilateral upper and lower extremities (5/5). Moves all 4 extremities with normal coordination voluntarily. Non-focal neuro exam.   Psychiatric:        Mood and Affect: Mood normal.        Speech: Speech normal.        Behavior: Behavior normal.        Thought Content: Thought content normal.        Judgment: Judgment normal.      UC Treatments / Results  Labs (all labs ordered are listed, but only abnormal results are displayed) Labs Reviewed  SARS CORONAVIRUS 2 (TAT 6-24 HRS)  POCT FASTING CBG [facility] MANUAL ENTRY    EKG   Radiology No results found.  Procedures Procedures (including critical care time)  Medications Ordered in UC Medications  ketorolac (TORADOL) 30 MG/ML  injection 30 mg (30 mg Intramuscular Given 05/01/23 1233)    Initial Impression / Assessment and Plan / UC Course  I have reviewed the triage vital signs and the nursing notes.  Pertinent labs & imaging results that were available during my care of the patient were reviewed by me and considered in my medical decision making (see chart for details).   1.  Viral URI with cough Evaluation suggests viral URI etiology. Will manage this with recommendations for OTC and prescription medications for symptomatic relief. Encouraged to push fluids to stay well hydrated.  Imaging: deferred based on stable cardiopulmonary exam/hemodynamically stable vital signs Viral testing: COVID-19 testing is pending, will call patient if positive.  She is a candidate for antiviral therapy if positive. Medications given in clinic: Ketorolac 30 mg IM for headache, chose this over oral ibuprofen since she has not eaten this morning.  2.  Musculoskeletal chest pain Chest pain is likely secondary to cough and I question MSK etiology given there is an amount of reproducibility on exam to palpation.  EKG shows normal rhythm with normal intervals and without ST/T wave changes.  Previous EKG reviewed for comparison does not show any new changes.  She is PERC negative.  3.  Essential hypertension, diet-controlled diabetes mellitus Blood pressure elevated at 156/104 today.  This is likely secondary to headache and viral illness. Most recent blood pressure readings have been  normal prior to today. BP Readings from Last 3 Encounters:  05/01/23 (!) 156/104  10/21/22 139/78  10/19/22 135/82   Fasting CBG in clinic is 96.  Type 2 diabetes diet controlled currently.  Advised to schedule appointment with primary care provider to discuss ongoing evaluation and management of chronic illnesses.  I would like to wait to put patient back on her metformin and her amlodipine-olmesartan since blood pressure and blood sugar are well-controlled  with recent weight loss. PCP follow-up advised in the next 3 to 5 days.  Counseled patient on potential for adverse effects with medications prescribed/recommended today, strict ER and return-to-clinic precautions discussed, patient verbalized understanding.    Final Clinical Impressions(s) / UC Diagnoses   Final diagnoses:  Essential hypertension  Viral URI with cough  Diet-controlled diabetes mellitus (HCC)  Musculoskeletal chest pain  Dizziness  Bad headache     Discharge Instructions      Your symptoms are most likely due to a viral illness, which will improve on its own with rest and fluids. - Take prescribed medicines to help with symptoms: tessalon perles for cough as needed every 8 hours - We gave you a shot of pain medicine here similar to ibuprofen, you may start ibuprofen tomorrow as needed for pain. You may use tylenol today as needed for aches/pains.  - Use over the counter medicines to help with symptoms as discussed: Ibuprofen, tylenol, guaifenesin (mucinex), zyrtec, etc - Two teaspoons of honey in warm water every 4-6 hours may help with throat pains - Humidifier in your room at night to help add water the air and soothe cough  Your CBG here is  Do not go long periods of time without eating, I am concerned your dizziness is due to low blood sugar. Eat a well balanced diet with protein, healthy carbohydrates, and health fats. Continue lowering salt in diet and increase exercise to reduce BP naturally. Your BP is likely elevated today due to head pain.   Please schedule an appointment with your PCP for the next 3-5 days.  If you develop any new or worsening symptoms or if your symptoms do not start to improve, pleases return here or follow-up with your primary care provider. If your symptoms are severe, please go to the emergency room.      ED Prescriptions     Medication Sig Dispense Auth. Provider   benzonatate (TESSALON) 100 MG capsule Take 1 capsule (100  mg total) by mouth every 8 (eight) hours. 21 capsule Carlisle Beers, FNP      PDMP not reviewed this encounter.   Carlisle Beers, FNP 05/01/23 1314    Carlisle Beers, FNP 05/01/23 1314

## 2023-05-01 NOTE — Discharge Instructions (Addendum)
Your symptoms are most likely due to a viral illness, which will improve on its own with rest and fluids. - Take prescribed medicines to help with symptoms: tessalon perles for cough as needed every 8 hours - We gave you a shot of pain medicine here similar to ibuprofen, you may start ibuprofen tomorrow as needed for pain. You may use tylenol today as needed for aches/pains.  - Use over the counter medicines to help with symptoms as discussed: Ibuprofen, tylenol, guaifenesin (mucinex), zyrtec, etc - Two teaspoons of honey in warm water every 4-6 hours may help with throat pains - Humidifier in your room at night to help add water the air and soothe cough  Your CBG here is  Do not go long periods of time without eating, I am concerned your dizziness is due to low blood sugar. Eat a well balanced diet with protein, healthy carbohydrates, and health fats. Continue lowering salt in diet and increase exercise to reduce BP naturally. Your BP is likely elevated today due to head pain.   Please schedule an appointment with your PCP for the next 3-5 days.  If you develop any new or worsening symptoms or if your symptoms do not start to improve, pleases return here or follow-up with your primary care provider. If your symptoms are severe, please go to the emergency room.

## 2023-05-02 LAB — SARS CORONAVIRUS 2 (TAT 6-24 HRS): SARS Coronavirus 2: NEGATIVE

## 2023-05-03 ENCOUNTER — Ambulatory Visit: Payer: Medicaid Other | Attending: Family Medicine

## 2023-05-03 ENCOUNTER — Ambulatory Visit: Payer: Medicaid Other | Admitting: Student

## 2023-05-03 VITALS — BP 110/69 | HR 73 | Ht 64.0 in | Wt 168.0 lb

## 2023-05-03 DIAGNOSIS — R42 Dizziness and giddiness: Secondary | ICD-10-CM | POA: Diagnosis not present

## 2023-05-03 DIAGNOSIS — F418 Other specified anxiety disorders: Secondary | ICD-10-CM | POA: Diagnosis not present

## 2023-05-03 DIAGNOSIS — E119 Type 2 diabetes mellitus without complications: Secondary | ICD-10-CM | POA: Diagnosis not present

## 2023-05-03 LAB — POCT GLYCOSYLATED HEMOGLOBIN (HGB A1C): HbA1c, POC (controlled diabetic range): 5.5 % (ref 0.0–7.0)

## 2023-05-03 NOTE — Progress Notes (Signed)
    SUBJECTIVE:   CHIEF COMPLAINT / HPI:   Dizziness and runny nose Seen in urgent care on 7/15 for the same.  Symptoms include intermittent dizziness/fogginess, rhinorrhea, chest pressure, SOB, racing heart, and headache. The symptoms do not occur as episodes at the same times but occur intermittently and she is unsure if there is a correlation between them.  Of note, has had similar symptoms (minus rhinorrhea) since 2019, occur randomly (will sometimes have weeks without symptoms ) but have consistent for past 5 days.  Dizziness occurs when lying flat or standing, feels like she may pass out but has not.  Today, symptoms have improved and chest pressure has completely resolved.  No fevers, nausea, vomiting, diarrhea. No sick contacts.   At UC she was diagnosed with viarl URI given Ketoralc IM for headache. Headache much improved today. EKG showed no concern for MI. Her CBG was 96, had not eating that day.  Has been feeling very anxious with history of panic attacks but is unsure if she can correlate her symptoms to anxiety.  Was previously prescribed Zoloft which she is no longer taking.  T2DM She does request A1c checked today, wondering if T2DM could be related to symptoms. Last A1c 8.3 in 10/19/22. Has not been taking metformin, wanted to try diet control and has purposefully lost 20 pounds since December 2023.   PERTINENT  PMH / PSH: T2DM, HTN, anxiety depression  OBJECTIVE:   BP 110/69   Pulse 73   Ht 5\' 4"  (1.626 m)   Wt 168 lb (76.2 kg)   LMP 04/27/2023   SpO2 100%   BMI 28.84 kg/m    General: NAD, pleasant, able to participate in exam Cardiac: RRR, no murmurs. Respiratory: CTAB, normal effort, No wheezes, rales or rhonchi Skin: warm and dry Neuro: alert, cranial nerves II through XII intact, sensation intact, finger-nose-finger test normal Psych: Normal affect and mood  ASSESSMENT/PLAN:   Dizziness Patient could have had URI causing rhinorrhea, however this is  unlikely cause of her other symptoms at this they have been persistent for several years.  Will evaluate for metabolic disturbances/electrolyte abnormalities, anemia with labs.  Shared decision making used and patient would like to extend workup to include ruling out cardiac etiology with Zio patch.  Reassuringly, EKG was normal at urgent care 2 days ago. Also consider anxiety/panic attacks as causing her symptoms but she would like to rule out other etiologies first. -BMP -CBC -Zio patch  Anxiety with depression Briefly discussed today.  Denies SI/HI.  Patient advised to return if she desires to discuss medication/therapy options.  Type 2 diabetes mellitus without complications (HCC) A1c decreased to 5.5 with dieting and weight loss. -Continue diet control     Dr. Erick Alley, DO [facility]

## 2023-05-03 NOTE — Assessment & Plan Note (Signed)
Patient could have had URI causing rhinorrhea, however this is unlikely cause of her other symptoms at this they have been persistent for several years.  Will evaluate for metabolic disturbances/electrolyte abnormalities, anemia with labs.  Shared decision making used and patient would like to extend workup to include ruling out cardiac etiology with Zio patch.  Reassuringly, EKG was normal at urgent care 2 days ago. Also consider anxiety/panic attacks as causing her symptoms but she would like to rule out other etiologies first. -BMP -CBC -Zio patch

## 2023-05-03 NOTE — Patient Instructions (Addendum)
It was great to see you! Thank you for allowing me to participate in your care!  I recommend that you always bring your medications to each appointment as this makes it easy to ensure you are on the correct medications and helps [place] not miss when refills are needed.  Our plans for today:  -I ordered a heart monitor.  This will be mailed to your home.  We will see if you have a heart issue causing your dizziness -I recommend following up on this issue based on the results from blood work on the heart monitor. -Return to freshener discuss anxiety and depression as desired    Take care and seek immediate care sooner if you develop any concerns.   Dr. Erick Alley, DO [facility]

## 2023-05-03 NOTE — Assessment & Plan Note (Signed)
A1c decreased to 5.5 with dieting and weight loss. -Continue diet control

## 2023-05-03 NOTE — Progress Notes (Unsigned)
Enrolled for [employer] to mail a ZIO XT long term holter monitor to the patients address on file.  

## 2023-05-03 NOTE — Assessment & Plan Note (Signed)
Briefly discussed today.  Denies SI/HI.  Patient advised to return if she desires to discuss medication/therapy options.

## 2023-05-04 ENCOUNTER — Other Ambulatory Visit: Payer: Self-pay | Admitting: Student

## 2023-05-04 ENCOUNTER — Encounter: Payer: Self-pay | Admitting: Student

## 2023-05-04 DIAGNOSIS — E875 Hyperkalemia: Secondary | ICD-10-CM

## 2023-05-04 LAB — BASIC METABOLIC PANEL
BUN/Creatinine Ratio: 18 (ref 9–23)
BUN: 10 mg/dL (ref 6–20)
CO2: 24 mmol/L (ref 20–29)
Calcium: 9.5 mg/dL (ref 8.7–10.2)
Chloride: 103 mmol/L (ref 96–106)
Creatinine, Ser: 0.56 mg/dL — ABNORMAL LOW (ref 0.57–1.00)
Glucose: 102 mg/dL — ABNORMAL HIGH (ref 70–99)
Potassium: 5.3 mmol/L — ABNORMAL HIGH (ref 3.5–5.2)
Sodium: 140 mmol/L (ref 134–144)
eGFR: 123 mL/min/{1.73_m2} (ref >59)

## 2023-05-04 LAB — CBC
Hematocrit: 35.5 % (ref 34.0–46.6)
Hemoglobin: 11.6 g/dL (ref 11.1–15.9)
MCH: 27.2 pg (ref 26.6–33.0)
MCHC: 32.7 g/dL (ref 31.5–35.7)
MCV: 83 fL (ref 79–97)
Platelets: 317 10*3/uL (ref 150–450)
RBC: 4.26 x10E6/uL (ref 3.77–5.28)
RDW: 13.7 % (ref 11.7–15.4)
WBC: 4.6 10*3/uL (ref 3.4–10.8)

## 2023-05-04 NOTE — Progress Notes (Signed)
Recent BMP came back with K slightly elevated to 5.3 (normal 5.2). Kidney function was normal and EKG normal at urgent care a few days ago.   Elevated K may not be significant but wan to recheck to ensure it has not increased further. I call patient but she did not pick up. Left voicemail to check her mychart or call office tomorrow to talk over the phone.   I will advise her to schedule lab only appointment for tomorrow if possible to recheck K. Future BMP ordered.

## 2023-05-08 ENCOUNTER — Other Ambulatory Visit: Payer: Medicaid Other

## 2023-05-08 DIAGNOSIS — E875 Hyperkalemia: Secondary | ICD-10-CM | POA: Diagnosis not present

## 2023-05-09 LAB — BASIC METABOLIC PANEL
BUN/Creatinine Ratio: 18 (ref 9–23)
BUN: 8 mg/dL (ref 6–20)
CO2: 22 mmol/L (ref 20–29)
Calcium: 9.1 mg/dL (ref 8.7–10.2)
Chloride: 105 mmol/L (ref 96–106)
Creatinine, Ser: 0.45 mg/dL — ABNORMAL LOW (ref 0.57–1.00)
Glucose: 89 mg/dL (ref 70–99)
Potassium: 4.1 mmol/L (ref 3.5–5.2)
Sodium: 138 mmol/L (ref 134–144)
eGFR: 129 mL/min/{1.73_m2} (ref >59)

## 2023-11-14 ENCOUNTER — Ambulatory Visit (HOSPITAL_COMMUNITY)
Admission: EM | Admit: 2023-11-14 | Discharge: 2023-11-14 | Disposition: A | Discharge status: Home / Self Care | Payer: Medicaid Other | Attending: Family Medicine | Admitting: Family Medicine

## 2023-11-14 ENCOUNTER — Encounter (HOSPITAL_COMMUNITY): Payer: Self-pay

## 2023-11-14 DIAGNOSIS — N750 Cyst of Bartholin's gland: Secondary | ICD-10-CM | POA: Diagnosis not present

## 2023-11-14 MED ORDER — DOXYCYCLINE HYCLATE 100 MG PO CAPS: 100.0000 mg | ORAL_CAPSULE | Freq: Two times a day (BID) | ORAL | 0 refills | Status: DC

## 2023-11-14 NOTE — ED Provider Notes (Signed)
[facility]    CSN: [id number] Arrival date & time: 11/14/23  1355      History   Chief Complaint Chief Complaint  Patient presents with   Abscess    HPI Jasmin Ellis is a 33 y.o. female.    Abscess  Patient is her for a bump at the vaginal area that has been coming and going the last several months. It can be painful at times.  No drainage noted.        Past Medical History:  Diagnosis Date   Anemia    Anxiety    Back pain, chronic    s/p fall 8 years ago   Diabetes mellitus in pregnancy in third trimester 08/05/2014   Current Diabetic Medications:  Insulin  [x]  Aspirin 81 mg daily after 12 weeks (? A2/B GDM)  Required Referrals for A1GDM or A2GDM: [ ]  Diabetes Education and Testing Supplies [ ]  Nutrition Cousult  For A2/B GDM or higher classes of DM [ ]  Diabetes Education and Testing Supplies [ ]  Nutrition Counsult [ ]  Fetal ECHO after 20 weeks  [ ]  Eye exam for retina evaluation   Baseline and surveillance lab   Diabetes mellitus without complication (HCC)    Fibroids    GBS (group B Streptococcus carrier), +RV culture, currently pregnant 12/09/2019   Obesity in pregnancy 07/03/2019    Patient Active Problem List   Diagnosis Date Noted   Depression 10/19/2022   Hypertension 09/28/2022   Medication refill 09/27/2021   Amenorrhea 10/19/2020   Type 2 diabetes mellitus without complications (HCC) 12/25/2019   Encounter for IUD removal 06/07/2019   Insomnia 03/10/2018   Anxiety with depression 03/10/2018   Dizziness 01/11/2018   Back pain, chronic     Past Surgical History:  Procedure Laterality Date   NO PAST SURGERIES      OB History     Gravida  4   Para  4   Term  4   Preterm  0   AB  0   Living  4      SAB  0   IAB  0   Ectopic  0   Multiple  0   Live Births  4            Home Medications    Prior to Admission medications   Medication Sig Start Date End Date Taking? Authorizing Provider  benzonatate  (TESSALON) 100 MG capsule Take 1 capsule (100 mg total) by mouth every 8 (eight) hours. 05/01/23   Carlisle Beers, FNP  rosuvastatin (CRESTOR) 20 MG tablet Take 1 tablet (20 mg total) by mouth daily. 10/19/22   Sabino Dick, DO  insulin NPH Human (HUMULIN N) 100 UNIT/ML injection Inject before breakfast (10 units) and before supper (8 units) Patient not taking: Reported on 01/01/2020 11/19/19 01/02/20  Dailey Bing, MD    Family History Family History  Problem Relation Age of Onset   Diabetes Father     Social History Social History   Tobacco Use   Smoking status: Never   Smokeless tobacco: Never  Vaping Use   Vaping status: Never Used  Substance Use Topics   Alcohol use: No   Drug use: No     Allergies   Patient has no known allergies.   Review of Systems Review of Systems  Constitutional: Negative.   HENT: Negative.    Respiratory: Negative.    Cardiovascular: Negative.   Gastrointestinal: Negative.   Genitourinary:  Positive for genital  sores.     Physical Exam Triage Vital Signs ED Triage Vitals  Encounter Vitals Group     BP 11/14/23 1527 (!) 144/92     Systolic BP Percentile --      Diastolic BP Percentile --      Pulse Rate 11/14/23 1527 78     Resp 11/14/23 1527 16     Temp 11/14/23 1527 98.3 F (36.8 C)     Temp Source 11/14/23 1527 Oral     SpO2 11/14/23 1527 98 %     Weight --      Height --      Head Circumference --      Peak Flow --      Pain Score 11/14/23 1529 8     Pain Loc --      Pain Education --      Exclude from Growth Chart --    No data found.  Updated Vital Signs BP (!) 144/92 (BP Location: Left Arm)   Pulse 78   Temp 98.3 F (36.8 C) (Oral)   Resp 16   LMP 11/05/2023 (Approximate)   SpO2 98%   Visual Acuity Right Eye Distance:   Left Eye Distance:   Bilateral Distance:    Right Eye Near:   Left Eye Near:    Bilateral Near:     Physical Exam Constitutional:      Appearance: Normal appearance.   Cardiovascular:     Rate and Rhythm: Normal rate and regular rhythm.  Pulmonary:     Effort: Pulmonary effort is normal.     Breath sounds: Normal breath sounds.  Genitourinary:    Comments: Chaperone present;  At the inferior portion of the right labia is a lump about grape sized;  tender;  no fullness into the vaginal wall/introitus Neurological:     Mental Status: She is alert.  Psychiatric:        Mood and Affect: Mood normal.      UC Treatments / Results  Labs (all labs ordered are listed, but only abnormal results are displayed) Labs Reviewed - No data to display  EKG   Radiology No results found.  Procedures Procedures (including critical care time)  Medications Ordered in UC Medications - No data to display  Initial Impression / Assessment and Plan / UC Course  I have reviewed the triage vital signs and the nursing notes.  Pertinent labs & imaging results that were available during my care of the patient were reviewed by me and considered in my medical decision making (see chart for details).  Final Clinical Impressions(s) / UC Diagnoses   Final diagnoses:  Bartholin gland cyst     Discharge Instructions      You were seen today for a cyst at the labial area.  I have sent out an antibiotic to help.  I do recommend warm sitz baths as well.  If this is a recurrent issue you should see an ob/gyn.  You may call [facility] at [phone] for an appointment.     ED Prescriptions     Medication Sig Dispense Auth. Provider   doxycycline (VIBRAMYCIN) 100 MG capsule Take 1 capsule (100 mg total) by mouth 2 (two) times daily. 20 capsule Jannifer Franklin, MD      PDMP not reviewed this encounter.   Jannifer Franklin, MD 11/14/23 1550

## 2023-11-14 NOTE — ED Triage Notes (Signed)
Pt states she has  a bump in her vaginal area that comes and goes for the past few months.

## 2023-11-14 NOTE — Discharge Instructions (Signed)
You were seen today for a cyst at the labial area.  I have sent out an antibiotic to help.  I do recommend warm sitz baths as well.  If this is a recurrent issue you should see an ob/gyn.  You may call [facility] at [phone] for an appointment.

## 2024-08-29 NOTE — Assessment & Plan Note (Signed)
-   Last A1c 5.5 in 07.2024, stable at 5.6 today - Home CBGs: not checking - Medications: Previously on metformin  - Adherence: good adherence to exercise and diet - Eye exam: Due, called patient and left VM advising her to schedule appointment with ophthalmologist - Foot exam: Performed today - Microalbumin: Performed today - Statin: No, stable at 66 previously - No symptoms of hypoglycemia, polyuria, polydipsia, numbness extremities, foot ulcers/trauma
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
## 2024-08-30 ENCOUNTER — Encounter: Payer: Self-pay | Admitting: Family Medicine

## 2024-08-30 ENCOUNTER — Other Ambulatory Visit (HOSPITAL_COMMUNITY)
Admission: RE | Admit: 2024-08-30 | Discharge: 2024-08-30 | Disposition: A | Discharge status: Home / Self Care | Source: Ambulatory Visit | Attending: Family Medicine | Admitting: Family Medicine

## 2024-08-30 ENCOUNTER — Ambulatory Visit (INDEPENDENT_AMBULATORY_CARE_PROVIDER_SITE_OTHER): Admitting: Family Medicine

## 2024-08-30 VITALS — BP 141/92 | HR 84 | Ht 64.0 in | Wt 169.8 lb

## 2024-08-30 DIAGNOSIS — I10 Essential (primary) hypertension: Secondary | ICD-10-CM

## 2024-08-30 DIAGNOSIS — Z124 Encounter for screening for malignant neoplasm of cervix: Secondary | ICD-10-CM | POA: Insufficient documentation

## 2024-08-30 DIAGNOSIS — E119 Type 2 diabetes mellitus without complications: Secondary | ICD-10-CM | POA: Diagnosis not present

## 2024-08-30 DIAGNOSIS — Z0000 Encounter for general adult medical examination without abnormal findings: Secondary | ICD-10-CM | POA: Diagnosis not present

## 2024-08-30 DIAGNOSIS — Z23 Encounter for immunization: Secondary | ICD-10-CM | POA: Diagnosis not present

## 2024-08-30 DIAGNOSIS — Z113 Encounter for screening for infections with a predominantly sexual mode of transmission: Secondary | ICD-10-CM | POA: Diagnosis not present

## 2024-08-30 DIAGNOSIS — Z0184 Encounter for antibody response examination: Secondary | ICD-10-CM | POA: Diagnosis not present

## 2024-08-30 LAB — POCT GLYCOSYLATED HEMOGLOBIN (HGB A1C): HbA1c, POC (controlled diabetic range): 5.6 % (ref 0.0–7.0)

## 2024-08-30 MED ORDER — AMLODIPINE BESYLATE 5 MG PO TABS: 5.0000 mg | ORAL_TABLET | Freq: Every day | ORAL | 0 refills | Status: AC

## 2024-08-30 NOTE — Patient Instructions (Signed)
 It was great to see you today! Thank you for choosing [facility] for your primary care. Jasmin Ellis was seen for annual visit.  Today we addressed: Diabetes - stable, keep up the great work with your diet and exercise Labs done today to check your kidney function, cholesterol, and for vaccine immunity  We are checking some labs today. I will send you a MyChart message with your results, per your preference. If you do not hear about your labs in the next 2 weeks, please call the office.  You should return to our clinic No follow-ups on file. Please arrive 15 minutes before your appointment to ensure smooth check in process.  We appreciate your efforts in making this happen.  Thank you for allowing me to participate in your care, Kathrine Melena, DO 08/30/2024, 10:52 AM PGY-2, [facility]

## 2024-08-30 NOTE — Assessment & Plan Note (Signed)
 BP elevated at this visit and previously.  Has not taken medications prior. - Amlodipine  5 mg daily - Attempted to call patient to advise her to come in for nurse visit in 2 weeks to get a BP cuff from Dr. Koval if able and get her BP reevaluated after taking the medication daily, went to voicemail and left VM - F/u in 2 months

## 2024-08-31 ENCOUNTER — Ambulatory Visit: Payer: Self-pay | Admitting: Family Medicine

## 2024-08-31 DIAGNOSIS — E119 Type 2 diabetes mellitus without complications: Secondary | ICD-10-CM

## 2024-08-31 LAB — LIPID PANEL
Chol/HDL Ratio: 2.3 ratio (ref 0.0–4.4)
Cholesterol, Total: 143 mg/dL (ref 100–199)
HDL: 62 mg/dL (ref >39)
LDL Chol Calc (NIH): 70 mg/dL (ref 0–99)
Triglycerides: 47 mg/dL (ref 0–149)
VLDL Cholesterol Cal: 11 mg/dL (ref 5–40)

## 2024-08-31 LAB — BASIC METABOLIC PANEL WITH GFR
BUN/Creatinine Ratio: 16 (ref 9–23)
BUN: 8 mg/dL (ref 6–20)
CO2: 22 mmol/L (ref 20–29)
Calcium: 9.4 mg/dL (ref 8.7–10.2)
Chloride: 106 mmol/L (ref 96–106)
Creatinine, Ser: 0.51 mg/dL — ABNORMAL LOW (ref 0.57–1.00)
Glucose: 86 mg/dL (ref 70–99)
Potassium: 4.3 mmol/L (ref 3.5–5.2)
Sodium: 139 mmol/L (ref 134–144)
eGFR: 125 mL/min/1.73 (ref >59)

## 2024-08-31 LAB — MEASLES/MUMPS/RUBELLA IMMUNITY
MUMPS ABS, IGG: 110 [AU]/ml (ref >10.9)
RUBEOLA AB, IGG: 214 [AU]/ml (ref >16.4)
Rubella Antibodies, IGG: 16.1 {index} (ref >0.99)

## 2024-08-31 LAB — HEPATITIS B SURFACE ANTIBODY, QUANTITATIVE: Hepatitis B Surf Ab Quant: <3.5 m[IU]/mL — ABNORMAL LOW

## 2024-08-31 LAB — HEPATITIS C ANTIBODY: Hep C Virus Ab: NONREACTIVE

## 2024-08-31 LAB — MICROALBUMIN / CREATININE URINE RATIO
Creatinine, Urine: 186.5 mg/dL
Microalb/Creat Ratio: 4 mg/g{creat} (ref 0–29)
Microalbumin, Urine: 7.1 ug/mL

## 2024-08-31 LAB — HEPATITIS B SURFACE ANTIGEN: Hepatitis B Surface Ag: NEGATIVE

## 2024-08-31 LAB — VARICELLA ZOSTER ANTIBODY, IGG: Varicella zoster IgG: REACTIVE

## 2024-08-31 LAB — HEPATITIS B CORE ANTIBODY, TOTAL: Hep B Core Total Ab: NEGATIVE

## 2024-09-02 LAB — CYTOLOGY - PAP
Chlamydia: NEGATIVE
Comment: NEGATIVE
Comment: NEGATIVE
Comment: NEGATIVE
Comment: NORMAL
Diagnosis: NEGATIVE
High risk HPV: NEGATIVE
Neisseria Gonorrhea: NEGATIVE
Trichomonas: NEGATIVE

## 2024-09-16 ENCOUNTER — Ambulatory Visit

## 2024-09-24 ENCOUNTER — Telehealth: Payer: Self-pay | Admitting: Family Medicine

## 2024-09-24 ENCOUNTER — Ambulatory Visit: Admitting: Family Medicine

## 2024-09-24 NOTE — Telephone Encounter (Addendum)
 Patient arrived at clinic - requesting home Blood Pressure monitor which was intended to be provided at clinic visit this AM with Dr. Janna.   The patients has a diagnosis of hypertension and it is medically necessary for them to have access to a home device to monitor blood pressure.  The patient does not have readily available insurance access to a device and cannot afford to purchase a device at this time.  The patient has been counseled that they do not need to continue to receive services from [facility] to receive a device.  The patient will be given a device free of charge.  Demonstrated device to patient, provided instruction and asked patient to bring a list of values to upcoming scheduled visits with Dr. Janna.   First reading in office 156/103

## 2024-09-24 NOTE — Telephone Encounter (Signed)
 Reviewed and agree with Dr Rennis plan.

## 2024-09-30 ENCOUNTER — Encounter: Payer: Self-pay | Admitting: Student

## 2024-09-30 ENCOUNTER — Ambulatory Visit: Admitting: Student

## 2024-09-30 VITALS — BP 139/81 | HR 85 | Ht 64.0 in | Wt 167.4 lb

## 2024-09-30 DIAGNOSIS — N644 Mastodynia: Secondary | ICD-10-CM | POA: Diagnosis not present

## 2024-09-30 NOTE — Progress Notes (Signed)
° ° °  SUBJECTIVE:   CHIEF COMPLAINT / HPI:   Jasmin Ellis is a 33 y.o. female presenting for left breast pain.   - intermittent  - ongoing for 1-2 months  - has not tried any OTC medications  - does not seem to be related to menses - located on her left breast and extends to her left back along the rib  - denies SOB or chest pain w exertion or other cardiac symptoms  - no family history of breast cancer  - denies nipple discharge  - stop breast feeding >5 years ago  - cannot feel a mass   PERTINENT  PMH / PSH: reviewed and updated.  OBJECTIVE:   BP 139/81   Pulse 85   Ht 5' 4 (1.626 m)   Wt 167 lb 6.4 oz (75.9 kg)   SpO2 100%   BMI 28.73 kg/m   Well-appearing, no acute distress Cardio: Regular rate, regular rhythm, no murmurs on exam. Pulm: Clear, no wheezing, no crackles. No increased work of breathing  MA Chaperone: Optometrist  Symmetry: normal  Skin: no edema, dimpling, or erythema  Nipples: no eversion or inversion, no discharge Tissue: uniform consistency, no masses palpated, no pain with palpation noted  Axilla: no palpable lymph nodes    ASSESSMENT/PLAN:   Assessment & Plan Breast pain, left Suspect MSK related pain, although unable to reproduce on exam with palpation. Possibly early singles as pain follows a dermatome but suspect it would have declared itself by now.  Normal breast exam however with patient being 33 years old, will go ahead and order diagnostic mammogram  Discussed topical options for pain control  Return in 1-2 months if no improvement      Damien Pinal, DO [facility]

## 2024-09-30 NOTE — Patient Instructions (Signed)
 It was great to see you today!   I have ordered a mammogram. You will be called to get this scheduled.   For your pain you can try several over the counter topical medications. Topical medications are a great option to treat pain as you can place the medication right where you are hurting and they have less side effects.   Voltaren Gel/Diclofenac Gel: this is an anti-inflammatory cream (same ingredient as Motrin /Ibuprofen /Aleve ) can be applied up to 4 times per day.     Lidocaine  patch: can be applied for 12 hours and after needs a 12 hour break to continue to be effective. Works by using numbing medication to help with pain relief. Can be found at any pharmacy over the counter or on [employer].com    Capsaicin cream: made from the same ingredient that makes hot peppers spicy, this works by numbing up the area of pain. Please wash your hands and do not touch your eyes after applying the cream as active ingredient can hurt your eyes.     Future Appointments  Date Time Provider Department Center  10/09/2024  8:30 AM Janna Ferrier, DO [facility]  10/31/2024  8:30 AM Janna Ferrier, DO [facility]    Please arrive 15 minutes before your appointment to ensure smooth check in process.  If you are more than 15 minutes late, you may be asked to reschedule.   Please bring a list of your medications with you to all appointments.   Please call the clinic at [phone] if your symptoms worsen or you have any concerns.  Thank you for allowing me to participate in your care, Dr. Damien Pinal [facility]

## 2024-10-08 ENCOUNTER — Other Ambulatory Visit: Payer: Self-pay | Admitting: Student

## 2024-10-08 DIAGNOSIS — N644 Mastodynia: Secondary | ICD-10-CM

## 2024-10-08 NOTE — Progress Notes (Signed)
" ° ° °  SUBJECTIVE:   CHIEF COMPLAINT / HPI:   HTN BP well controlled on Amlodipine  5 mg daily.  Health Maintenance - Vaccines today  PERTINENT  PMH / PSH: Reviewed  OBJECTIVE:   BP 133/78   Pulse 81   Ht 5' 4 (1.626 m)   Wt 169 lb (76.7 kg)   SpO2 100%   BMI 29.01 kg/m  General: Awake and Alert in NAD HEENT: NCAT. Sclera anicteric. No rhinorrhea. Respiratory: Normal WOB on RA.  Extremities: Able to move all extremities. No BLE edema. Skin: Warm and dry. Neuro: A&Ox3. No focal neurological deficits.  ASSESSMENT/PLAN:   Assessment & Plan Primary hypertension Patient's blood pressure is controlled today. BP: 133/78. Goal of 130/80. Patient's medication regimen includes Amlodipine  5 mg daily. - No changes to current regimen - Urine microalbumin and BMP recently performed Mild depression PHQ of 5 today.  Patient reports that she occasionally has some mild sadness and depression from life changes here in [place] compared to [place].  We discussed medication vs therapy vs conservative measures today, patient would like to try meditation, communication with family/friends, and may consider therapy (although was hesitant).  Would not like to try medications today due to the possible side effects, may consider this in the future if she is feeling worse. Encounter for immunization Discussed hepatitis B (low immunity noted on previous labs) and pneumonia vaccines today.  Patient was consented and things were administered by Harlene Reiter.  Kathrine Melena, DO [facility]  "

## 2024-10-09 ENCOUNTER — Ambulatory Visit: Admitting: Family Medicine

## 2024-10-09 ENCOUNTER — Encounter: Payer: Self-pay | Admitting: Family Medicine

## 2024-10-09 VITALS — BP 133/78 | HR 81 | Ht 64.0 in | Wt 169.0 lb

## 2024-10-09 DIAGNOSIS — I10 Essential (primary) hypertension: Secondary | ICD-10-CM | POA: Diagnosis present

## 2024-10-09 DIAGNOSIS — F32A Depression, unspecified: Secondary | ICD-10-CM | POA: Diagnosis not present

## 2024-10-09 DIAGNOSIS — Z23 Encounter for immunization: Secondary | ICD-10-CM | POA: Diagnosis not present

## 2024-10-09 NOTE — Assessment & Plan Note (Addendum)
 Patient's blood pressure is controlled today. BP: 133/78. Goal of 130/80. Patient's medication regimen includes Amlodipine  5 mg daily. - No changes to current regimen - Urine microalbumin and BMP recently performed

## 2024-10-09 NOTE — Patient Instructions (Addendum)
 It was great to see you today! Thank you for choosing [facility] for your primary care. Jasmin Ellis was seen for BP check.  Today we addressed: Vaccines: Hep B vaccine and pneumonia vaccines today Blood pressure - Grea/*-t BP control, keep taking your amlodipine  Depression - we discussed medications and at this time you would like to conservative measures at this time. Some of those include meditation, exercise, therapy, and conversing with family/friends Schedule an eye exam with your eye doctor  You should return to our clinic No follow-ups on file. Please arrive 15 minutes before your appointment to ensure smooth check in process.  We appreciate your efforts in making this happen.  Thank you for allowing me to participate in your care, Kathrine Melena, DO 10/09/2024, 8:38 AM PGY-2, [facility]   Therapy and Counseling Resources Most providers on this list will take Medicaid. Patients with commercial insurance or Medicare should contact their insurance company to get a list of in network providers.  [facility] (takes children) Location 1: [address], [address] [place], [place] [address] Location 2: [address] [place], [place] [address] [phone]   [facility] (spanish speaking therapist available)(habla espanol)(take medicare and medicaid)  [address], [place], [place] [address], [place]  [email or web address] [phone]  [facility] [address] [place], [place] [address] [phone]  [facility]   [address] Suite [place], [place] [address]      [phone]  [facility] (spanish available) [address]  [place], [place] [address] [phone]  [facility] (take [facility] and medicare) [address]  [place], [place] [address]       [phone]     [facility] (virtual only) [phone]  Janit Griffins [facility] [address], [place], [place] [phone]  [facility]:  [address] [place] [phone]  [facility]:  [address] [place] [phone]  [facility] (under & uninsured) [address]   [place] [phone]    [email or web address]    [facility] [address]  [place]    [phone]  [facility] Suite -[address]     Phone:  [phone]     [facility]-  [address]  [phone]   [facility] [address]      [place], [place] [phone]  [facility]: [address] [place], [place], [place]  [phone]   [facility] (Spanish therapist) [email or web address]  [address] [place]  [address]   [place] [address]    [phone]    [facility]   [address],  [place], [place]  [phone]   [facility]  [address] [place]  [phone]  [facility]  [address] [place], [place]        [phone]  Open Access/Walk In Clinic under & uninsured  [facility]  [address] [place], [place] [phone] Crisis [phone]  [facility] [address],  (Spanish)   [address] , [place]: [phone]) 8:30 - 12; 1 - 2:30  [facility] [employer],  [address] [place], [place]    [phone]):8:30 - 12; 2 - 3PM  [facility] [employer],  [address],  [place]; [phone]):   Mon - Fri 8 AM - 5 PM  [facility] [address] [place]  MWF 12:30 to 3:00 or call to schedule an appointment  [phone]  Specific Provider options Psychology Today  [email or web address]  click on find a therapist  enter your zip code left side and select or tailor a therapist for your specific  need.   [facility] Provider Directory [email or web address]  (Medicaid)   Follow all drop down to find a provider  Social Support program Mental Health [place] [phone] or photosolver.pl [address], [place], [place] Recovery support and educational   24- Hour Availability:   [facility]  [address] [place], [place] [phone] Crisis [phone]  [facility] [phone]  [facility]  [phone]   [facility]  [phone] (after hours)  Therapeutic Alternative/Mobile Crisis   [phone]  [place]  National Suicide Hotline  (551)754-5937 MERRILYN)  Call 911 or go to emergency room  [facility]  [phone]);  [place]  [phone]); [place], [place], [place], [place], [place], [place], [place]

## 2024-10-09 NOTE — Assessment & Plan Note (Addendum)
 PHQ of 5 today.  Patient reports that she occasionally has some mild sadness and depression from life changes here in [place] compared to [place].  We discussed medication vs therapy vs conservative measures today, patient would like to try meditation, communication with family/friends, and may consider therapy (although was hesitant).  Would not like to try medications today due to the possible side effects, may consider this in the future if she is feeling worse.

## 2024-10-31 ENCOUNTER — Ambulatory Visit: Payer: Self-pay | Admitting: Family Medicine

## 2024-11-12 ENCOUNTER — Other Ambulatory Visit

## 2024-11-12 ENCOUNTER — Encounter
